# Patient Record
Sex: Male | Born: 1940 | Race: White | Hispanic: No
Health system: Midwestern US, Community
[De-identification: ages and names within clinical notes are randomized; demographics above are authoritative.]

## PROBLEM LIST (undated history)

## (undated) DIAGNOSIS — Z9889 Other specified postprocedural states: Secondary | ICD-10-CM

## (undated) DIAGNOSIS — I499 Cardiac arrhythmia, unspecified: Secondary | ICD-10-CM

## (undated) DIAGNOSIS — R112 Nausea with vomiting, unspecified: Secondary | ICD-10-CM

## (undated) DIAGNOSIS — E785 Hyperlipidemia, unspecified: Secondary | ICD-10-CM

## (undated) DIAGNOSIS — M7500 Adhesive capsulitis of unspecified shoulder: Secondary | ICD-10-CM

## (undated) DIAGNOSIS — N400 Enlarged prostate without lower urinary tract symptoms: Secondary | ICD-10-CM

## (undated) DIAGNOSIS — U071 COVID-19: Principal | ICD-10-CM

## (undated) DIAGNOSIS — E7800 Pure hypercholesterolemia, unspecified: Principal | ICD-10-CM

## (undated) DIAGNOSIS — K219 Gastro-esophageal reflux disease without esophagitis: Secondary | ICD-10-CM

## (undated) DIAGNOSIS — J301 Allergic rhinitis due to pollen: Secondary | ICD-10-CM

## (undated) DIAGNOSIS — N5201 Erectile dysfunction due to arterial insufficiency: Secondary | ICD-10-CM

## (undated) DIAGNOSIS — R051 Acute cough: Secondary | ICD-10-CM

## (undated) HISTORY — DX: Benign prostatic hyperplasia without lower urinary tract symptoms: N40.0

## (undated) HISTORY — PX: HERNIA REPAIR: SHX51

## (undated) HISTORY — PX: TRIGGER FINGER RELEASE: SHX641

---

## 2003-03-05 ENCOUNTER — Encounter: Admission: RE | Admit: 2003-03-05 | Discharge: 2003-03-05 | Payer: Self-pay | Admitting: Surgery

## 2003-03-05 ENCOUNTER — Encounter: Payer: Self-pay | Admitting: Surgery

## 2003-03-09 ENCOUNTER — Encounter (INDEPENDENT_AMBULATORY_CARE_PROVIDER_SITE_OTHER): Payer: Self-pay | Admitting: Specialist

## 2003-03-09 ENCOUNTER — Ambulatory Visit (HOSPITAL_COMMUNITY): Admission: RE | Admit: 2003-03-09 | Discharge: 2003-03-09 | Payer: Self-pay | Admitting: Surgery

## 2003-03-09 ENCOUNTER — Ambulatory Visit (HOSPITAL_BASED_OUTPATIENT_CLINIC_OR_DEPARTMENT_OTHER): Admission: RE | Admit: 2003-03-09 | Discharge: 2003-03-09 | Payer: Self-pay | Admitting: Surgery

## 2004-12-17 ENCOUNTER — Emergency Department (HOSPITAL_COMMUNITY): Admission: EM | Admit: 2004-12-17 | Discharge: 2004-12-18 | Payer: Self-pay | Admitting: Emergency Medicine

## 2006-02-20 ENCOUNTER — Ambulatory Visit: Payer: Self-pay | Admitting: Family Medicine

## 2006-03-27 ENCOUNTER — Ambulatory Visit: Payer: Self-pay | Admitting: Family Medicine

## 2006-04-08 ENCOUNTER — Ambulatory Visit: Payer: Self-pay | Admitting: Family Medicine

## 2006-11-05 ENCOUNTER — Ambulatory Visit: Payer: Self-pay | Admitting: Family Medicine

## 2007-05-22 HISTORY — PX: COLONOSCOPY: SHX174

## 2007-05-22 LAB — HM COLONOSCOPY

## 2007-05-28 ENCOUNTER — Ambulatory Visit: Payer: Self-pay | Admitting: Family Medicine

## 2007-07-10 ENCOUNTER — Ambulatory Visit: Payer: Self-pay | Admitting: Family Medicine

## 2007-08-22 ENCOUNTER — Ambulatory Visit: Payer: Self-pay | Admitting: Family Medicine

## 2007-09-12 ENCOUNTER — Ambulatory Visit: Payer: Self-pay | Admitting: Family Medicine

## 2007-10-20 ENCOUNTER — Ambulatory Visit: Payer: Self-pay | Admitting: Family Medicine

## 2008-02-03 ENCOUNTER — Ambulatory Visit: Payer: Self-pay | Admitting: Family Medicine

## 2008-12-24 ENCOUNTER — Ambulatory Visit: Payer: Self-pay | Admitting: Family Medicine

## 2009-02-21 ENCOUNTER — Ambulatory Visit: Payer: Self-pay | Admitting: Family Medicine

## 2009-04-22 ENCOUNTER — Ambulatory Visit: Payer: Self-pay | Admitting: Family Medicine

## 2009-06-08 ENCOUNTER — Ambulatory Visit: Payer: Self-pay | Admitting: Family Medicine

## 2009-07-20 ENCOUNTER — Ambulatory Visit: Payer: Self-pay | Admitting: Family Medicine

## 2009-07-29 ENCOUNTER — Encounter: Admission: RE | Admit: 2009-07-29 | Discharge: 2009-07-29 | Payer: Self-pay | Admitting: Family Medicine

## 2009-11-02 ENCOUNTER — Ambulatory Visit: Payer: Self-pay | Admitting: Family Medicine

## 2010-06-05 ENCOUNTER — Ambulatory Visit
Admission: RE | Admit: 2010-06-05 | Discharge: 2010-06-05 | Payer: Self-pay | Source: Home / Self Care | Attending: Family Medicine | Admitting: Family Medicine

## 2010-08-30 ENCOUNTER — Ambulatory Visit (INDEPENDENT_AMBULATORY_CARE_PROVIDER_SITE_OTHER): Payer: 59 | Admitting: Family Medicine

## 2010-08-30 DIAGNOSIS — S61409A Unspecified open wound of unspecified hand, initial encounter: Secondary | ICD-10-CM

## 2010-08-30 DIAGNOSIS — M79609 Pain in unspecified limb: Secondary | ICD-10-CM

## 2010-08-31 ENCOUNTER — Ambulatory Visit: Payer: Self-pay | Admitting: Family Medicine

## 2010-10-06 NOTE — Op Note (Signed)
Logan Banks, Logan Banks                       ACCOUNT NO.:  [id number]   MEDICAL RECORD NO.:  [id number]                   PATIENT TYPE:  AMB   LOCATION:  DSC                                  FACILITY:  [facility]   PHYSICIAN:  Thornton Park. Daphine Deutscher, M.D.             DATE OF BIRTH:  [birth year]   DATE OF PROCEDURE:  03/09/2003  DATE OF DISCHARGE:                                 OPERATIVE REPORT   CCS:  #[id number]   PREOPERATIVE DIAGNOSIS:  Right inguinal hernia.   POSTOPERATIVE DIAGNOSIS:  Large right indirect inguinal hernia.   PROCEDURE:  Right inguinal herniorrhaphy with mesh.   SURGEON:  Thornton Park. Daphine Deutscher, M.D.   ANESTHESIA:  General by LMA.   DESCRIPTION OF PROCEDURE:  The patient had the correct side marked, and was  taken back to room #3 at [facility]. [facility].  This  was done on March 09, 2003.  The lower abdomen was prepped with Betadine  and draped sterilely.  The skin was marked and a small oblique incision was  made, and the skin edges were marked to facilitate subsequent approximation.  We went down to external oblique which was identified and then there was a  prominent hernia noted coming out through the external ring.  I then incised  the fibers of the external ring and the external oblique, and then preserved  the ilioinguinal nerve branch with the superior flap.  I mobilized the cord  structures and then found a very large indirect inguinal hernia.  This was  dissected from the cord and about two inches worth of the sac was carried up  to the neck and the internal ring.  With my finger inside, I felt the floor,  and it felt reasonably strong, and no other masses were noted.  I then  twisted off the sac and did a high ligation with a #2-0 silk.  I removed the  excess sac.  The patient also had a very large lipoma of the cord which I removed, and  did a high ligation of this fatty tumor as well.  I then reconstructed the  internal ring using a  piece of mesh.  This was sutured along the inguinal  ligament and into the internal obliques and carried around the cord  structures and sutured to itself with a simple #2-0 Prolene.  This  reconstructed the floor.  The ilioinguinal nerve branch was allowed to  return back into anatomic position.  The area was irrigated and injected  with Marcaine.  The external oblique was closed with a running #2-0 Vicryl,  and #4-  0 Vicryl was used in the subcutaneous tissue, and to align the skin edges,  which then were closed with interrupted #5-0 Vicryl with Benzoin and Steri-  Strips.  The patient tolerated the procedure well, and was taken to the recovery room  in satisfactory condition.  Thornton Park Daphine Deutscher, M.D.    MBM/MEDQ  D:  03/09/2003  T:  03/09/2003  Job:  [id number]   cc:   Sharlot Gowda, M.D.  [address]  [place], [place] [address]  Fax: [phone]   Jethro Bolus, M.D.

## 2010-10-10 ENCOUNTER — Telehealth: Payer: Self-pay | Admitting: Family Medicine

## 2010-10-10 MED ORDER — ATORVASTATIN CALCIUM 10 MG PO TABS: 10.0000 mg | ORAL_TABLET | Freq: Every day | ORAL | Status: DC

## 2010-10-10 NOTE — Telephone Encounter (Signed)
Lipitor generic prescription was called in

## 2010-10-21 ENCOUNTER — Encounter: Payer: Self-pay | Admitting: Family Medicine

## 2010-10-21 DIAGNOSIS — N400 Enlarged prostate without lower urinary tract symptoms: Secondary | ICD-10-CM | POA: Insufficient documentation

## 2010-10-23 ENCOUNTER — Ambulatory Visit (INDEPENDENT_AMBULATORY_CARE_PROVIDER_SITE_OTHER): Payer: 59 | Admitting: Family Medicine

## 2010-10-23 ENCOUNTER — Encounter: Payer: Self-pay | Admitting: Family Medicine

## 2010-10-23 VITALS — BP 130/92 | HR 72 | Ht 68.0 in | Wt 169.0 lb

## 2010-10-23 DIAGNOSIS — Z8249 Family history of ischemic heart disease and other diseases of the circulatory system: Secondary | ICD-10-CM | POA: Insufficient documentation

## 2010-10-23 DIAGNOSIS — E785 Hyperlipidemia, unspecified: Secondary | ICD-10-CM

## 2010-10-23 DIAGNOSIS — J301 Allergic rhinitis due to pollen: Secondary | ICD-10-CM

## 2010-10-23 DIAGNOSIS — Z0000 Encounter for general adult medical examination without abnormal findings: Secondary | ICD-10-CM

## 2010-10-23 LAB — POCT URINALYSIS DIPSTICK
Glucose, UA: NEGATIVE
Leukocytes, UA: NEGATIVE
Nitrite, UA: NEGATIVE
Spec Grav, UA: 1.015
Urobilinogen, UA: NEGATIVE

## 2010-10-23 NOTE — Patient Instructions (Addendum)
Continue on your present medications and return here in the fall for a flu shot. If you start having any chest or heart related symptoms, don't hesitate to call me.

## 2010-10-23 NOTE — Progress Notes (Signed)
  Subjective:    Patient ID: Logan Banks, male    DOB: [birth year], 70 y.o.   MRN: [id number]  HPI He is here for a complete examination. Overall he seems to be doing well. He has had some urinary flow issues but states that this is doing much better. His allergies are under good control. He continues on Lipitor, aspirin, fish oil and multivitamins. He does exercise regularly. Does not smoke and rarely drinks. Presently he is not involved in any relationship. Work continues to go well.   Review of Systems  HENT: Negative.   Eyes: Negative.   Respiratory: Negative.   Cardiovascular: Negative.   Gastrointestinal: Negative.   Musculoskeletal: Negative.   Neurological: Negative.   Psychiatric/Behavioral: Negative.        Objective:   Physical Exam BP 130/92  Pulse 72  Ht 5\' 8"  (1.727 m)  Wt 169 lb (76.658 kg)  BMI 25.70 kg/m2  General Appearance:    Alert, cooperative, no distress, appears stated age  Head:    Normocephalic, without obvious abnormality, atraumatic  Eyes:    PERRL, conjunctiva/corneas clear, EOM's intact, fundi    benign  Ears:    Normal TM's and external ear canals  Nose:   Nares normal, mucosa normal, no drainage or sinus   tenderness  Throat:   Lips, mucosa, and tongue normal; teeth and gums normal  Neck:   Supple, no lymphadenopathy;  thyroid:  no   enlargement/tenderness/nodules; no carotid   bruit or JVD  Back:    Spine nontender, no curvature, ROM normal, no CVA     tenderness  Lungs:     Clear to auscultation bilaterally without wheezes, rales or     ronchi; respirations unlabored  Chest Wall:    No tenderness or deformity   Heart:    Regular rate and rhythm, S1 and S2 normal, no murmur, rub   or gallop  Breast Exam:    No chest wall tenderness, masses or gynecomastia  Abdomen:     Soft, non-tender, nondistended, normoactive bowel sounds,    no masses, no hepatosplenomegaly  Genitalia:    Normal male external genitalia without lesions.  Testicles  without masses.  No inguinal hernias.     Extremities:   No clubbing, cyanosis or edema  Pulses:   2+ and symmetric all extremities  Skin:   Skin color, texture, turgor normal, no rashes or lesions  Lymph nodes:   Cervical, supraclavicular, and axillary nodes normal  Neurologic:   CNII-XII intact, normal strength, sensation and gait; reflexes 2+ and symmetric throughout          Psych:   Normal mood, affect, hygiene and grooming.          Assessment & Plan:  Hyperlipidemia. Allergic rhinitis. Family history of heart disease. Continue to remain physically active. Routine blood screening will be ordered.

## 2011-01-02 ENCOUNTER — Telehealth: Payer: Self-pay | Admitting: Family Medicine

## 2011-01-02 NOTE — Telephone Encounter (Signed)
Needs refill generic xanax .25   [facility]

## 2011-01-02 NOTE — Telephone Encounter (Signed)
Renew his Xanax 

## 2011-01-02 NOTE — Telephone Encounter (Signed)
Med [facility] in [facility] .25 # 30 with 0 refill

## 2011-05-28 LAB — HM COLONOSCOPY

## 2011-06-23 ENCOUNTER — Other Ambulatory Visit: Payer: Self-pay

## 2011-06-23 ENCOUNTER — Observation Stay (HOSPITAL_COMMUNITY)
Admission: EM | Admit: 2011-06-23 | Discharge: 2011-06-24 | Disposition: A | Discharge status: Home / Self Care | Payer: Medicare Other | Attending: Internal Medicine | Admitting: Internal Medicine

## 2011-06-23 ENCOUNTER — Encounter (HOSPITAL_COMMUNITY): Payer: Self-pay | Admitting: *Deleted

## 2011-06-23 ENCOUNTER — Emergency Department (HOSPITAL_COMMUNITY): Payer: Medicare Other

## 2011-06-23 DIAGNOSIS — I2510 Atherosclerotic heart disease of native coronary artery without angina pectoris: Secondary | ICD-10-CM | POA: Insufficient documentation

## 2011-06-23 DIAGNOSIS — R0789 Other chest pain: Principal | ICD-10-CM | POA: Insufficient documentation

## 2011-06-23 DIAGNOSIS — E785 Hyperlipidemia, unspecified: Secondary | ICD-10-CM | POA: Diagnosis present

## 2011-06-23 DIAGNOSIS — Z79899 Other long term (current) drug therapy: Secondary | ICD-10-CM | POA: Insufficient documentation

## 2011-06-23 DIAGNOSIS — N400 Enlarged prostate without lower urinary tract symptoms: Secondary | ICD-10-CM | POA: Insufficient documentation

## 2011-06-23 DIAGNOSIS — Z7982 Long term (current) use of aspirin: Secondary | ICD-10-CM | POA: Insufficient documentation

## 2011-06-23 DIAGNOSIS — R079 Chest pain, unspecified: Secondary | ICD-10-CM | POA: Diagnosis present

## 2011-06-23 HISTORY — DX: Hyperlipidemia, unspecified: E78.5

## 2011-06-23 LAB — CBC
HCT: 45.3 % (ref 39.0–52.0)
Hemoglobin: 16 g/dL (ref 13.0–17.0)
MCH: 31.9 pg (ref 26.0–34.0)
MCHC: 35.3 g/dL (ref 30.0–36.0)
RBC: 5.02 MIL/uL (ref 4.22–5.81)

## 2011-06-23 LAB — DIFFERENTIAL
Basophils Relative: 0 % (ref 0–1)
Eosinophils Absolute: 0.1 10*3/uL (ref 0.0–0.7)
Lymphs Abs: 1 10*3/uL (ref 0.7–4.0)
Monocytes Absolute: 1.1 10*3/uL — ABNORMAL HIGH (ref 0.1–1.0)
Monocytes Relative: 11 % (ref 3–12)
Neutro Abs: 8.2 10*3/uL — ABNORMAL HIGH (ref 1.7–7.7)
Neutrophils Relative %: 79 % — ABNORMAL HIGH (ref 43–77)

## 2011-06-23 MED ORDER — ASPIRIN 81 MG PO CHEW: 324.0000 mg | CHEWABLE_TABLET | Freq: Once | ORAL | Status: DC

## 2011-06-23 MED ORDER — SODIUM CHLORIDE 0.9 % IV SOLN
999.0000 mL | INTRAVENOUS | Status: DC
  Administered 2011-06-23: 1000 mL via INTRAVENOUS
  Administered 2011-06-24 (×2): 999 mL via INTRAVENOUS

## 2011-06-23 MED ORDER — NITROGLYCERIN 0.4 MG SL SUBL
0.4000 mg | SUBLINGUAL_TABLET | SUBLINGUAL | Status: DC | PRN
  Administered 2011-06-24: 0.4 mg via SUBLINGUAL
  Filled 2011-06-23 (×2): qty 25

## 2011-06-23 MED ORDER — ASPIRIN 81 MG PO CHEW
324.0000 mg | CHEWABLE_TABLET | Freq: Once | ORAL | Status: AC
  Administered 2011-06-23: 324 mg via ORAL
  Filled 2011-06-23: qty 4

## 2011-06-23 NOTE — ED Notes (Signed)
Formatting of this note might be different from the original.  Pt states that he began to have indigestion x 1 day ago with concurrent right chest pain.  Pt went to [facility] on [address] this AM for evaluation at 0915.  Pt states that there was an EKG performed at that point with no abnormalities noted and the pt was sent home. Pt presents this evening due to the pain becoming "slightly stronger".  Electronically signed by Minna Merritts, RN at 06/23/2011 10:38 PM EST

## 2011-06-23 NOTE — ED Notes (Signed)
Pt states that he began to have indigestion x 1 day ago with concurrent right chest pain.  Pt went to [facility] on [address] this AM for evaluation at 0915.  Pt states that there was an EKG performed at that point with no abnormalities noted and the pt was sent home. Pt presents this evening due to the pain becoming "slightly stronger".

## 2011-06-24 ENCOUNTER — Encounter (HOSPITAL_COMMUNITY): Payer: Self-pay | Admitting: Family Medicine

## 2011-06-24 DIAGNOSIS — R079 Chest pain, unspecified: Secondary | ICD-10-CM | POA: Diagnosis present

## 2011-06-24 LAB — BASIC METABOLIC PANEL
Calcium: 8.7 mg/dL (ref 8.4–10.5)
GFR calc Af Amer: >90 mL/min (ref >90)
GFR calc non Af Amer: 84 mL/min — ABNORMAL LOW (ref >90)
Potassium: 3.7 mEq/L (ref 3.5–5.1)
Sodium: 140 mEq/L (ref 135–145)

## 2011-06-24 LAB — LIPID PANEL
HDL: 34 mg/dL — ABNORMAL LOW (ref >39)
LDL Cholesterol: 56 mg/dL (ref 0–99)
Total CHOL/HDL Ratio: 2.9 RATIO
VLDL: 9 mg/dL (ref 0–40)

## 2011-06-24 LAB — DIFFERENTIAL
Basophils Absolute: 0 10*3/uL (ref 0.0–0.1)
Eosinophils Relative: 0 % (ref 0–5)
Lymphocytes Relative: 6 % — ABNORMAL LOW (ref 12–46)
Lymphs Abs: 0.4 10*3/uL — ABNORMAL LOW (ref 0.7–4.0)
Neutro Abs: 6.3 10*3/uL (ref 1.7–7.7)
Neutrophils Relative %: 79 % — ABNORMAL HIGH (ref 43–77)

## 2011-06-24 LAB — COMPREHENSIVE METABOLIC PANEL
Albumin: 3.8 g/dL (ref 3.5–5.2)
Alkaline Phosphatase: 76 U/L (ref 39–117)
BUN: 17 mg/dL (ref 6–23)
Chloride: 102 mEq/L (ref 96–112)
Creatinine, Ser: 1.04 mg/dL (ref 0.50–1.35)
GFR calc Af Amer: 83 mL/min — ABNORMAL LOW (ref >90)
Glucose, Bld: 106 mg/dL — ABNORMAL HIGH (ref 70–99)
Potassium: 3.7 mEq/L (ref 3.5–5.1)
Total Bilirubin: 0.5 mg/dL (ref 0.3–1.2)

## 2011-06-24 LAB — CARDIAC PANEL(CRET KIN+CKTOT+MB+TROPI)
CK, MB: 3.6 ng/mL (ref 0.3–4.0)
CK, MB: 2.1 ng/mL (ref 0.3–4.0)
Relative Index: INVALID (ref 0.0–2.5)
Total CK: 122 U/L (ref 7–232)
Total CK: 78 U/L (ref 7–232)

## 2011-06-24 LAB — CBC
Hemoglobin: 14.8 g/dL (ref 13.0–17.0)
MCH: 31.8 pg (ref 26.0–34.0)
Platelets: 171 10*3/uL (ref 150–400)
RBC: 4.65 MIL/uL (ref 4.22–5.81)
WBC: 8.1 10*3/uL (ref 4.0–10.5)

## 2011-06-24 LAB — D-DIMER, QUANTITATIVE: D-Dimer, Quant: 0.28 ug/mL-FEU (ref 0.00–0.48)

## 2011-06-24 MED ORDER — ONDANSETRON HCL 4 MG PO TABS: 4.0000 mg | ORAL_TABLET | Freq: Four times a day (QID) | ORAL | Status: DC | PRN

## 2011-06-24 MED ORDER — ASPIRIN 325 MG PO TABS
325.0000 mg | ORAL_TABLET | Freq: Every day | ORAL | Status: DC
  Administered 2011-06-24: 325 mg via ORAL
  Filled 2011-06-24 (×2): qty 1

## 2011-06-24 MED ORDER — SODIUM CHLORIDE 0.9 % IJ SOLN: 3.0000 mL | INTRAMUSCULAR | Status: DC | PRN

## 2011-06-24 MED ORDER — MORPHINE SULFATE 4 MG/ML IJ SOLN
4.0000 mg | Freq: Once | INTRAMUSCULAR | Status: DC
  Filled 2011-06-24: qty 1

## 2011-06-24 MED ORDER — METOPROLOL SUCCINATE ER 25 MG PO TB24: 12.5000 mg | ORAL_TABLET | Freq: Every day | ORAL | Status: DC

## 2011-06-24 MED ORDER — WHITE PETROLATUM GEL
Status: AC
  Administered 2011-06-24: 04:00:00
  Filled 2011-06-24: qty 5

## 2011-06-24 MED ORDER — ONDANSETRON HCL 4 MG/2ML IJ SOLN: 4.0000 mg | Freq: Four times a day (QID) | INTRAMUSCULAR | Status: DC | PRN

## 2011-06-24 MED ORDER — ONDANSETRON HCL 4 MG/2ML IJ SOLN
4.0000 mg | Freq: Once | INTRAMUSCULAR | Status: DC
  Filled 2011-06-24: qty 2

## 2011-06-24 MED ORDER — BIOTENE DRY MOUTH MT LIQD
15.0000 mL | Freq: Two times a day (BID) | OROMUCOSAL | Status: DC
  Administered 2011-06-24: 15 mL via OROMUCOSAL

## 2011-06-24 MED ORDER — ALUM & MAG HYDROXIDE-SIMETH 200-200-20 MG/5ML PO SUSP: 30.0000 mL | Freq: Four times a day (QID) | ORAL | Status: DC | PRN

## 2011-06-24 MED ORDER — ENOXAPARIN SODIUM 40 MG/0.4ML ~~LOC~~ SOLN
40.0000 mg | SUBCUTANEOUS | Status: DC
  Administered 2011-06-24: 40 mg via SUBCUTANEOUS
  Filled 2011-06-24 (×2): qty 0.4

## 2011-06-24 MED ORDER — SIMVASTATIN 20 MG PO TABS
20.0000 mg | ORAL_TABLET | Freq: Every day | ORAL | Status: DC
  Administered 2011-06-24: 20 mg via ORAL
  Filled 2011-06-24 (×2): qty 1

## 2011-06-24 MED ORDER — MORPHINE SULFATE 2 MG/ML IJ SOLN: 2.0000 mg | INTRAMUSCULAR | Status: DC | PRN

## 2011-06-24 MED ORDER — HYDROCODONE-ACETAMINOPHEN 5-325 MG PO TABS: 1.0000 | ORAL_TABLET | ORAL | Status: DC | PRN

## 2011-06-24 NOTE — Progress Notes (Signed)
Formatting of this note might be different from the original.  Patient  Had some trigeminy and quad PVCs, denies any pain or distress, in bed resting; Dr David Stall notified, at the bedside to assess patient, no new order given. Will continue to assess patient.   Electronically signed by Orlando Penner, RN at 06/24/2011  2:00 PM EST

## 2011-06-24 NOTE — Discharge Summary (Signed)
Formatting of this note is different from the original.  Admit date: 06/23/2011  Discharge date: 06/24/2011    Primary Care Physician:  Carollee Herter, MD, MD    Discharge Diagnoses:    Active Hospital Problems   Diagnoses Date Noted    ? Hyperlipidemia 10/23/2010    ? BPH (benign prostatic hyperplasia)       Resolved Hospital Problems   Diagnoses Date Noted Date Resolved   ? Chest pain 06/24/2011 06/24/2011       DISCHARGE MEDICATION:  Medication List  As of 06/24/2011 12:46 PM    TAKE these medications        aspirin 81 MG tablet    Take 81 mg by mouth daily.       atorvastatin 10 MG tablet    Commonly known as: LIPITOR    Take 1 tablet (10 mg total) by mouth daily.       fish oil-omega-3 fatty acids 1000 MG capsule    Take 2 g by mouth daily.       glucosamine-chondroitin 500-400 MG tablet    Take 1 tablet by mouth 3 (three) times daily.       multivitamin with minerals tablet    Take 1 tablet by mouth daily.             SIGNIFICANT DIAGNOSTIC STUDIES:   Dg Chest 2 View    06/24/2011  *RADIOLOGY REPORT*  Clinical Data: Right-sided chest pain  CHEST - 2 VIEW  Comparison: None.  Findings: Heart size upper normal limits to mildly enlarged. Mildly prominent pulmonary arterial branches centrally.  1 cm nodule periphery left lower lobe is favored to be calcified/sequelae of prior granulomatous infection. Mild rightward curvature of the lower thoracic spine.  No acute osseous abnormality.  IMPRESSION: Heart size upper normal limits, with mild pulmonary arterial prominence.  No acute process identified.  Original Report Authenticated By: Waneta Martins, M.D.     No results found for this or any previous visit (from the past 240 hour(s)).    BRIEF ADMITTING H & P:  71 year old gentleman with no known history of coronary artery disease. He states that yesterday he developed right-sided chest pain, no radiation. It woke him from his sleep. He thought it was indigestion and went back to sleep. He woke up this a.m. with the  same chest pain right-sided. It persisted all day. He did stop at an urgent care during the day were EKG was done and normal. However in returning home his significant other he insisted he come to the ER. Pain is described as dull, 7/10 at its worse. He reports no cough, no nausea, no vomiting, no trauma. He reports no radiation. He reports no GERD. He reports possible mild pain with very deep inspiration. He reports no wheeze, no shortness of breath. He did have a stress test approximately 5 years ago, it was negative. History obtained from the patient. His cardiologist is Dr. Katrinka Blazing, he was referred to Dr. Katrinka Blazing 5 years ago by his PCP since because of his family history of coronary artery disease.    Active Hospital Problems   Diagnoses Date Noted    ? Hyperlipidemia:  Continue lipitor. 10/23/2010    ? BPH (benign prostatic hyperplasia):  No changes       Resolved Hospital Problems   Diagnoses Date Noted Date Resolved   ? Chest pain:  He was admitted to the telemetry unit. Had an episode of trigeminy. Which resolved. He was started on  metoprolol. Cardiac enzymes negative times 2, he really wanted to leave on the day of discharge.  He related that he will follow up with Dr. Katrinka Blazing but he really needed to go today. Has remained asymptomatic. 06/24/2011 06/24/2011       Disposition and Follow-up:   Discharge Orders     Future Orders Please Complete By Expires    Diet - low sodium heart healthy       Increase activity slowly          Follow-up Information     Follow up with Carollee Herter, MD in 1 week. (follow up )       Follow up with Lesleigh Noe, MD in 1 week. (call for appointment)     Contact information:    [address]  [address]  [place] [address]  [phone]          DISCHARGE EXAM:   See progress note.    Blood pressure 110/68, pulse 98, temperature 98.2 F (36.8 C), temperature source Oral, resp. rate 16, height 5\' 8"  (1.727 m), weight 76.4 kg (168 lb 6.9 oz), SpO2  98.00%.    Basename 06/24/11 0520 06/23/11 2320   NA 140 138   K 3.7 3.7   CL 106 102   CO2 26 26   GLUCOSE 107* 106*   BUN 14 17   CREATININE 0.93 1.04   CALCIUM 8.7 9.5   MG -- --   PHOS -- --     Basename 06/23/11 2320   AST 25   ALT 27   ALKPHOS 76   BILITOT 0.5   PROT 7.6   ALBUMIN 3.8     No results found for this basename: LIPASE:2,AMYLASE:2 in the last 72 hours    Basename 06/24/11 0520 06/23/11 2320   WBC 8.1 10.4   NEUTROABS 6.3 8.2*   HGB 14.8 16.0   HCT 41.5 45.3   MCV 89.2 90.2   PLT 171 197     Signed:  Marinda Elk M.D.  06/24/2011, 12:46 PM    Electronically signed by Marinda Elk, MD at 06/24/2011  4:47 PM EST

## 2011-06-24 NOTE — Progress Notes (Signed)
Formatting of this note might be different from the original.  Patient D/C'd home  With girlfriend, discharge instruction given and patient. Patient verbalized understand, denies any distress or pain, rated his pain 0 out 10 pain scale. Skin intact, no wound.  Accompanied home by girlfriend.      Electronically signed by Orlando Penner, RN at 06/24/2011  6:21 PM EST

## 2011-06-24 NOTE — ED Notes (Signed)
Formatting of this note might be different from the original.  MD at bedside. Admitting MD  Electronically signed by Leonides Schanz, RN at 06/24/2011  2:18 AM EST

## 2011-06-24 NOTE — ED Provider Notes (Signed)
Formatting of this note is different from the original.  History       CSN: [id number]    Arrival date & time 06/23/11  2232     First MD Initiated Contact with Patient 06/23/11 2316        Chief Complaint   Patient presents with   ? Chest Pain     sore, dull, worse w/ inspiration and when lying flat.     (Consider location/radiation/quality/duration/timing/severity/associated sxs/prior  treatment)  HPI  Patient presents with complaint of chest pain. He states the pain began approximately 2 AM and almost 24 hours prior to arrival. Pain is located in his right chest and is worse with lying flat. It is described as a dull aching pain. He states that prior to ED evaluation he went to an urgent care and had a normal EKG. He states he came to the emergency department tonight because pain became somewhat worse. He rates pain as 7/10. He denies any shortness of breath or nausea or radiation of the pain. He did not have any diaphoresis until arrival in the ED when he developed diaphoresis while being triaged. He has had no syncope or palpitations. He has no history of heart disease of which he is aware. He states he did have a stress test several years ago which he believes was normal. Chest pain is not worsened with exertion or movement. Seems to be improved with lying flat. He has not taken any medications for the pain except and aspirin earlier this morning. There no other alleviating or modifying factors. There no associated systemic symptoms.    Past Medical History   Diagnosis Date   ? BPH (benign prostatic hyperplasia)    ? Hyperlipidemia    ? Coronary artery disease      Past Surgical History   Procedure Date   ? Colonoscopy 2009     [facility]   ? Hernia repair      Family History   Problem Relation Age of Onset   ? Coronary artery disease       History   Substance Use Topics   ? Smoking status: Never Smoker    ? Smokeless tobacco: Never Used   ? Alcohol Use: 0.6 oz/week     1 Glasses of wine per week     Review of  Systems  ROS reviewed and otherwise negative except for mentioned in HPI    Allergies   Review of patient's allergies indicates no known allergies.    Home Medications     Current Outpatient Rx   Name Route Sig Dispense Refill   ? ASPIRIN 81 MG PO TABS Oral Take 81 mg by mouth daily.       ? ATORVASTATIN CALCIUM 10 MG PO TABS Oral Take 1 tablet (10 mg total) by mouth daily. 30 tablet 11   ? OMEGA-3 FATTY ACIDS 1000 MG PO CAPS Oral Take 2 g by mouth daily.       ? GLUCOSAMINE-CHONDROITIN 500-400 MG PO TABS Oral Take 1 tablet by mouth 3 (three) times daily.     ? MULTI-VITAMIN/MINERALS PO TABS Oral Take 1 tablet by mouth daily.      ? METOPROLOL SUCCINATE ER 25 MG PO TB24 Oral Take 0.5 tablets (12.5 mg total) by mouth daily. 60 tablet 0     BP 109/69  Pulse 92  Temp(Src) 99.3 F (37.4 C) (Oral)  Resp 18  Ht 5\' 8"  (1.727 m)  Wt 168 lb 6.9 oz (76.4 kg)  BMI 25.61 kg/m2  SpO2 96%  Vitals reviewed  Physical Exam  Physical Examination: General appearance - alert, well appearing, and in no distress, concerned appearing  Mental status - alert, oriented to person, place, and time  Eyes - pupils equal and reactive, no scleral icterus  Mouth - mucous membranes moist, pharynx normal without lesions  Chest - clear to auscultation, no wheezes, rales or rhonchi, symmetric air entry, nontender  Heart - normal rate, regular rhythm, normal S1, S2, no murmurs, rubs, clicks or gallops  Abdomen - soft, nontender, nondistended, no masses or organomegaly  Musculoskeletal - no joint tenderness, deformity or swelling  Extremities - peripheral pulses normal, no pedal edema, no clubbing or cyanosis  Skin - normal coloration and turgor, no rashes, mild diaphoresis of forehead    ED Course   Procedures (including critical care time)     Date: 06/24/2011   Rate: 82   Rhythm: normal sinus rhythm   QRS Axis: normal   Intervals: normal   ST/T Wave abnormalities: normal   Conduction Disutrbances:none   Narrative Interpretation:    Old EKG  Reviewed: unchanged compared to 03/05/03    1:25 AM d/w Dr. Joneen Roach, [facility] hospitalist- she states she will see patient in the ED    Labs Reviewed   DIFFERENTIAL - Abnormal; Notable for the following:     Neutrophils Relative 79 (*)     Neutro Abs 8.2 (*)     Lymphocytes Relative 10 (*)     Monocytes Absolute 1.1 (*)     All other components within normal limits   COMPREHENSIVE METABOLIC PANEL - Abnormal; Notable for the following:     Glucose, Bld 106 (*)     GFR calc non Af Amer 72 (*)     GFR calc Af Amer 83 (*)     All other components within normal limits   CARDIAC PANEL(CRET KIN+CKTOT+MB+TROPI) - Abnormal; Notable for the following:     Relative Index 3.0 (*)     All other components within normal limits   LIPID PANEL - Abnormal; Notable for the following:     HDL 34 (*)     All other components within normal limits   BASIC METABOLIC PANEL - Abnormal; Notable for the following:     Glucose, Bld 107 (*)     GFR calc non Af Amer 84 (*)     All other components within normal limits   DIFFERENTIAL - Abnormal; Notable for the following:     Neutrophils Relative 79 (*)     Lymphocytes Relative 6 (*)     Lymphs Abs 0.4 (*)     Monocytes Relative 15 (*)     Monocytes Absolute 1.2 (*)     All other components within normal limits   CBC   D-DIMER, QUANTITATIVE   CBC   D-DIMER, QUANTITATIVE   CARDIAC PANEL(CRET KIN+CKTOT+MB+TROPI)   TROPONIN I   DIFFERENTIAL     Dg Chest 2 View    06/24/2011  *RADIOLOGY REPORT*  Clinical Data: Right-sided chest pain  CHEST - 2 VIEW  Comparison: None.  Findings: Heart size upper normal limits to mildly enlarged. Mildly prominent pulmonary arterial branches centrally.  1 cm nodule periphery left lower lobe is favored to be calcified/sequelae of prior granulomatous infection. Mild rightward curvature of the lower thoracic spine.  No acute osseous abnormality.  IMPRESSION: Heart size upper normal limits, with mild pulmonary arterial prominence.  No acute process identified.  Original Report  Authenticated By:  Waneta Martins, M.D.     1. Chest pain      MDM   Patient seen and evaluated upon being brought back to the ED room. He had mild diaphoresis and pain 7/10 in his right chest. Otherwise exam was reassuring. His EKG was reassuring as well as normal troponin and chest x-ray. Pain was relieved down to 4/10 with nitroglycerin. I ordered morphine 2 continue to treat the chest pain however patient stated that he did not wish to take the morphine. I discussed the patient with try hospitalist to plan to see the patient in the ED for admission and further evaluation.    Ethelda Chick, MD  06/25/11 [phone]  Electronically signed by Ethelda Chick, MD at 06/25/2011  2:48 AM EST

## 2011-06-24 NOTE — ED Notes (Signed)
Formatting of this note might be different from the original.  Grafton Folk  [phone].  Electronically signed by Leonides Schanz, RN at 06/24/2011 12:50 AM EST

## 2011-06-24 NOTE — H&P (Signed)
Formatting of this note is different from the original.  PCP:    Carollee Herter, MD, MD   Cardiologist in Dr. Katrinka Blazing    Chief Complaint:   Chest pains    HPI:  This is a 71 year old gentleman with no known history of coronary artery disease. He states that yesterday he developed right-sided chest pain, no radiation. It woke him from his sleep. He thought it was indigestion and went back to sleep. He woke up this a.m. with the same chest pain right-sided. It persisted all day. He did stop at an urgent care during the day were EKG was done and normal. However in returning home his significant other he insisted he come to the ER. Pain is described as dull, 7/10 at its worse. He reports no cough, no nausea, no vomiting, no trauma. He reports no radiation. He reports no GERD. He reports possible mild pain with very deep inspiration. He reports no wheeze, no shortness of breath. He did have a stress test approximately 5 years ago, it was negative. History obtained from the patient. His cardiologist is Dr. Katrinka Blazing, he was referred to Dr. Katrinka Blazing 5 years ago by his PCP since because of his family history of coronary artery disease.    Review of Systems: Positives bolded    anorexia, fever, weight loss,, vision loss, decreased hearing, hoarseness, chest pain, syncope, dyspnea on exertion, peripheral edema, balance deficits, hemoptysis, abdominal pain, melena, hematochezia, severe indigestion/heartburn, hematuria, incontinence, genital sores, muscle weakness, suspicious skin lesions, transient blindness, difficulty walking, depression, unusual weight change, abnormal bleeding, enlarged lymph nodes, angioedema, and breast masses.    Past Medical History:  Past Medical History   Diagnosis Date   ? BPH (benign prostatic hyperplasia)    ? Hyperlipidemia      Past Surgical History   Procedure Date   ? Colonoscopy 2009     [facility]   ? Hernia repair      Medications:  Prior to Admission medications    Medication Sig Start Date End  Date Taking? Authorizing Provider   aspirin 81 MG tablet Take 81 mg by mouth daily.     Yes Historical Provider, MD   atorvastatin (LIPITOR) 10 MG tablet Take 1 tablet (10 mg total) by mouth daily. 10/10/10 10/10/11 Yes Carollee Herter, MD   fish oil-omega-3 fatty acids 1000 MG capsule Take 2 g by mouth daily.     Yes Historical Provider, MD   glucosamine-chondroitin 500-400 MG tablet Take 1 tablet by mouth 3 (three) times daily.   Yes Historical Provider, MD   Multiple Vitamins-Minerals (MULTIVITAMIN WITH MINERALS) tablet Take 1 tablet by mouth daily.    Yes Historical Provider, MD     Allergies:   No Known Allergies    Social History:   reports that he has never smoked. He has never used smokeless tobacco. He reports that he drinks about .6 ounces of alcohol per week. He reports that he does not use illicit drugs.    Family History:  Family History   Problem Relation Age of Onset   ? Coronary artery disease       Physical Exam:  Filed Vitals:    06/23/11 2315 06/23/11 2323 06/23/11 2330 06/24/11 0129   BP: 141/76 134/73 126/75 115/55   Pulse: 83 83 86 101   Temp:       TempSrc:       Resp: 13 13 18     Weight:       SpO2: 100% 100%  100% 98%     General:  Alert and oriented times three, well developed and nourished, no acute distress  Eyes: PERRLA, pink conjunctiva, no scleral icterus  ENT: Moist oral mucosa, neck supple, no thyromegaly  Lungs: clear to ascultation, no wheeze, no crackles, no use of accessory muscles  Cardiovascular: regular rate and rhythm, no regurgitation, no gallops, no murmurs. No carotid bruits, no JVD  Abdomen: soft, positive BS, non-tender, non-distended, no organomegaly, not an acute abdomen  GU: not examined  Neuro: CN II - XII grossly intact, sensation intact  Musculoskeletal: strength 5/5 all extremities, no clubbing, cyanosis or edema, no reproducible chest wall pain  Skin: no rash, no subcutaneous crepitation, no decubitus  Psych: appropriate patient    Labs on Admission:      [facility] 06/23/11 2320   NA 138   K 3.7   CL 102   CO2 26   GLUCOSE 106*   BUN 17   CREATININE 1.04   CALCIUM 9.5   MG --   PHOS --     Basename 06/23/11 2320   AST 25   ALT 27   ALKPHOS 76   BILITOT 0.5   PROT 7.6   ALBUMIN 3.8     No results found for this basename: LIPASE:2,AMYLASE:2 in the last 72 hours    Basename 06/23/11 2320   WBC 10.4   NEUTROABS 8.2*   HGB 16.0   HCT 45.3   MCV 90.2   PLT 197     Basename 06/23/11 2320   CKTOTAL 122   CKMB 3.6   CKMBINDEX --   TROPONINI <0.30     No components found with this basename: POCBNP:3    Basename 06/23/11 2320   DDIMER 0.31     No results found for this basename: HGBA1C:2 in the last 72 hours  No results found for this basename: CHOL:2,HDL:2,LDLCALC:2,TRIG:2,CHOLHDL:2,LDLDIRECT:2 in the last 72 hours  No results found for this basename: TSH,T4TOTAL,FREET3,T3FREE,THYROIDAB in the last 72 hours  No results found for this basename: VITAMINB12:2,FOLATE:2,FERRITIN:2,TIBC:2,IRON:2,RETICCTPCT:2 in the last 72 hours    Micro Results:  No results found for this or any previous visit (from the past 240 hour(s)).    Radiological Exams on Admission:  Dg Chest 2 View    06/24/2011  *RADIOLOGY REPORT*  Clinical Data: Right-sided chest pain  CHEST - 2 VIEW  Comparison: None.  Findings: Heart size upper normal limits to mildly enlarged. Mildly prominent pulmonary arterial branches centrally.  1 cm nodule periphery left lower lobe is favored to be calcified/sequelae of prior granulomatous infection. Mild rightward curvature of the lower thoracic spine.  No acute osseous abnormality.  IMPRESSION: Heart size upper normal limits, with mild pulmonary arterial prominence.  No acute process identified.  Original Report Authenticated By: Waneta Martins, M.D.     EKG normal sinus    Assessment/Plan  Present on Admission:   ?right Chest pain  Admit to telemetry   Cycle cardiac enzymes  Aspirin, lipid panel in a.m.  Nitroglycerin when necessary, morphine when necessary pain   Doubt  cardiac etiology  ?BPH (benign prostatic hyperplasia)  ?Hyperlipidemia  Lipid panel in a.m.  Stable    Full code  DVT prophylaxis  Team 5/Dr. Andree Moro, DEBBY  06/24/2011, 2:55 AM   Electronically signed by Gery Pray, MD at 06/24/2011  3:06 AM EST

## 2011-06-24 NOTE — ED Notes (Signed)
Formatting of this note might be different from the original.  PATIENT HAS HAD SEVERAL EPISODES OF TRIGEMINY. RN NOTIFIED. MD notified  Electronically signed by Basilia Jumbo, RN at 06/24/2011  1:42 AM EST

## 2011-06-24 NOTE — Progress Notes (Signed)
Formatting of this note might be different from the original.  06/24/11-Received telephone report from Anna,RN in ER Dept. @ 03:50am. Awaiting pt's arrival to floor Room#[id number]  for admission process.  Electronically signed by Brynda Greathouse, RN at 06/24/2011  3:58 AM EST

## 2011-06-24 NOTE — ED Notes (Signed)
Formatting of this note might be different from the original.  Patient denies pain and is resting comfortably. States he is sore only and he doesn't want pain medication,  Pt is having runs of [place]ig[place],  Dr Karma Ganja aware and going to see pt for admission process  Electronically signed by Leonides Schanz, RN at 06/24/2011  1:18 AM EST

## 2011-06-24 NOTE — ED Notes (Signed)
Formatting of this note might be different from the original.  Pt returned from xray  Electronically signed by Leonides Schanz, RN at 06/24/2011 12:09 AM EST

## 2011-06-24 NOTE — Progress Notes (Signed)
Formatting of this note is different from the original.  Subjective:    Pain improved today.  Worst with movement on the right.  Objective:  Filed Vitals:    06/24/11 0315 06/24/11 0330 06/24/11 0420 06/24/11 0702   BP:  102/76 111/68 110/68   Pulse: 86 86 95 98   Temp:   98.2 F (36.8 C) 98.2 F (36.8 C)   TempSrc:   Oral Oral   Resp: 13 15 16 16    Height:   5\' 8"  (1.727 m)    Weight:   76.4 kg (168 lb 6.9 oz)    SpO2: 100% 100% 98% 98%     Weight change:     Intake/Output Summary (Last 24 hours) at 06/24/11 1101  Last data filed at 06/24/11 0900   Gross per 24 hour   Intake    360 ml   Output    250 ml   Net    110 ml     General: Alert, awake, oriented x3, in no acute distress.   HEENT: No bruits, no goiter.   Heart: Regular rate and rhythm, without murmurs, rubs, gallops.   Lungs: good air movement cts B/L, mild tenderness on the right side to palpation.  Abdomen: Soft, nontender, nondistended, positive bowel sounds.   Neuro: Grossly intact, nonfocal.    Lab Results:    Basename 06/24/11 0520 06/23/11 2320   NA 140 138   K 3.7 3.7   CL 106 102   CO2 26 26   GLUCOSE 107* 106*   BUN 14 17   CREATININE 0.93 1.04   CALCIUM 8.7 9.5   MG -- --   PHOS -- --     Basename 06/23/11 2320   AST 25   ALT 27   ALKPHOS 76   BILITOT 0.5   PROT 7.6   ALBUMIN 3.8     No results found for this basename: LIPASE:2,AMYLASE:2 in the last 72 hours    Basename 06/24/11 0520 06/23/11 2320   WBC 8.1 10.4   NEUTROABS -- 8.2*   HGB 14.8 16.0   HCT 41.5 45.3   MCV 89.2 90.2   PLT 171 197     Basename 06/23/11 2320   CKTOTAL 122   CKMB 3.6   CKMBINDEX --   TROPONINI <0.30     No components found with this basename: POCBNP:3    Basename 06/23/11 2320   DDIMER 0.31     No results found for this basename: HGBA1C:2 in the last 72 hours  No results found for this basename: CHOL:2,HDL:2,LDLCALC:2,TRIG:2,CHOLHDL:2,LDLDIRECT:2 in the last 72 hours  No results found for this basename: TSH,T4TOTAL,FREET3,T3FREE,THYROIDAB in the last 72 hours  No  results found for this basename: VITAMINB12:2,FOLATE:2,FERRITIN:2,TIBC:2,IRON:2,RETICCTPCT:2 in the last 72 hours    Micro Results:  No results found for this or any previous visit (from the past 240 hour(s)).    Studies/Results:  Dg Chest 2 View    06/24/2011  *RADIOLOGY REPORT*  Clinical Data: Right-sided chest pain  CHEST - 2 VIEW  Comparison: None.  Findings: Heart size upper normal limits to mildly enlarged. Mildly prominent pulmonary arterial branches centrally.  1 cm nodule periphery left lower lobe is favored to be calcified/sequelae of prior granulomatous infection. Mild rightward curvature of the lower thoracic spine.  No acute osseous abnormality.  IMPRESSION: Heart size upper normal limits, with mild pulmonary arterial prominence.  No acute process identified.  Original Report Authenticated By: Waneta Martins, M.D.  Medications: I have reviewed the patient's current medications.    Assessment and plan:  Principal Problem:  1.Chest pain:  Very low risk for cardiac event. Check a  D-dimer if positive a CT angio.  -EKG no st segment changes. Mild tachy on telemetry. Trigeminy on Telemetry. No smoking history, not a diabetic, no family history of MI. If cardiac enzymes negative can follow up with cards as an outpatient.    2. Hyperlipidemia  Continue. Lipitor.     LOS: 1 day     Marinda Elk M.D.  Pager: [phone]  [facility]  06/24/2011, 11:01 AM      Electronically signed by Marinda Elk, MD at 06/24/2011 11:27 AM EST

## 2011-06-24 NOTE — ED Notes (Signed)
[facility]  [phone].

## 2011-06-24 NOTE — Progress Notes (Signed)
Subjective:  Pain improved today. Worst with movement on the right. Objective: Filed Vitals:   06/24/11 0315 06/24/11 0330 06/24/11 0420 06/24/11 0702  BP:  102/76 111/68 110/68  Pulse: 86 86 95 98  Temp:   98.2 F (36.8 C) 98.2 F (36.8 C)  TempSrc:   Oral Oral  Resp: 13 15 16 16   Height:   5\' 8"  (1.727 m)   Weight:   76.4 kg (168 lb 6.9 oz)   SpO2: 100% 100% 98% 98%   Weight change:   Intake/Output Summary (Last 24 hours) at 06/24/11 1101 Last data filed at 06/24/11 0900  Gross per 24 hour  Intake    360 ml  Output    250 ml  Net    110 ml    General: Alert, awake, oriented x3, in no acute distress.  HEENT: No bruits, no goiter.  Heart: Regular rate and rhythm, without murmurs, rubs, gallops.  Lungs: good air movement cts B/L, mild tenderness on the right side to palpation. Abdomen: Soft, nontender, nondistended, positive bowel sounds.  Neuro: Grossly intact, nonfocal.   Lab Results:  Basename 06/24/11 0520 06/23/11 2320  NA 140 138  K 3.7 3.7  CL 106 102  CO2 26 26  GLUCOSE 107* 106*  BUN 14 17  CREATININE 0.93 1.04  CALCIUM 8.7 9.5  MG -- --  PHOS -- --    Basename 06/23/11 2320  AST 25  ALT 27  ALKPHOS 76  BILITOT 0.5  PROT 7.6  ALBUMIN 3.8   No results found for this basename: LIPASE:2,AMYLASE:2 in the last 72 hours  Basename 06/24/11 0520 06/23/11 2320  WBC 8.1 10.4  NEUTROABS -- 8.2*  HGB 14.8 16.0  HCT 41.5 45.3  MCV 89.2 90.2  PLT 171 197    Basename 06/23/11 2320  CKTOTAL 122  CKMB 3.6  CKMBINDEX --  TROPONINI <0.30   No components found with this basename: POCBNP:3  Basename 06/23/11 2320  DDIMER 0.31   No results found for this basename: HGBA1C:2 in the last 72 hours No results found for this basename: CHOL:2,HDL:2,LDLCALC:2,TRIG:2,CHOLHDL:2,LDLDIRECT:2 in the last 72 hours No results found for this basename: TSH,T4TOTAL,FREET3,T3FREE,THYROIDAB in the last 72 hours No results found for this basename:  VITAMINB12:2,FOLATE:2,FERRITIN:2,TIBC:2,IRON:2,RETICCTPCT:2 in the last 72 hours  Micro Results: No results found for this or any previous visit (from the past 240 hour(s)).  Studies/Results: Dg Chest 2 View  06/24/2011  *RADIOLOGY REPORT*  Clinical Data: Right-sided chest pain  CHEST - 2 VIEW  Comparison: None.  Findings: Heart size upper normal limits to mildly enlarged. Mildly prominent pulmonary arterial branches centrally.  1 cm nodule periphery left lower lobe is favored to be calcified/sequelae of prior granulomatous infection. Mild rightward curvature of the lower thoracic spine.  No acute osseous abnormality.  IMPRESSION: Heart size upper normal limits, with mild pulmonary arterial prominence.  No acute process identified.  Original Report Authenticated By: Waneta Martins, M.D.    Medications: I have reviewed the patient's current medications.  Assessment and plan: Principal Problem: 1.Chest pain: Very low risk for cardiac event. Check a  D-dimer if positive a CT angio. -EKG no st segment changes. Mild tachy on telemetry. Trigeminy on Telemetry. No smoking history, not a diabetic, no family history of MI. If cardiac enzymes negative can follow up with cards as an outpatient.  2. Hyperlipidemia Continue. Lipitor.     LOS: 1 day   Marinda Elk M.D. Pager: [phone] [facility] 06/24/2011, 11:01 AM

## 2011-06-24 NOTE — ED Notes (Signed)
Patient denies pain and is resting comfortably. States he is sore only and he doesn't want pain medication,  Pt is having runs of [place]ig[place],  Dr Karma Ganja aware and going to see pt for admission process

## 2011-06-24 NOTE — Discharge Summary (Signed)
Admit date: 06/23/2011 Discharge date: 06/24/2011  Primary Care Physician:  Carollee Herter, MD, MD   Discharge Diagnoses:   Active Hospital Problems  Diagnoses Date Noted   . Hyperlipidemia 10/23/2010   . BPH (benign prostatic hyperplasia)      Resolved Hospital Problems  Diagnoses Date Noted Date Resolved  . Chest pain 06/24/2011 06/24/2011     DISCHARGE MEDICATION: Medication List  As of 06/24/2011 12:46 PM   TAKE these medications         aspirin 81 MG tablet   Take 81 mg by mouth daily.      atorvastatin 10 MG tablet   Commonly known as: LIPITOR   Take 1 tablet (10 mg total) by mouth daily.      fish oil-omega-3 fatty acids 1000 MG capsule   Take 2 g by mouth daily.      glucosamine-chondroitin 500-400 MG tablet   Take 1 tablet by mouth 3 (three) times daily.      multivitamin with minerals tablet   Take 1 tablet by mouth daily.             SIGNIFICANT DIAGNOSTIC STUDIES:  Dg Chest 2 View  06/24/2011  *RADIOLOGY REPORT*  Clinical Data: Right-sided chest pain  CHEST - 2 VIEW  Comparison: None.  Findings: Heart size upper normal limits to mildly enlarged. Mildly prominent pulmonary arterial branches centrally.  1 cm nodule periphery left lower lobe is favored to be calcified/sequelae of prior granulomatous infection. Mild rightward curvature of the lower thoracic spine.  No acute osseous abnormality.  IMPRESSION: Heart size upper normal limits, with mild pulmonary arterial prominence.  No acute process identified.  Original Report Authenticated By: Waneta Martins, M.D.    No results found for this or any previous visit (from the past 240 hour(s)).  BRIEF ADMITTING H & P: 71 year old gentleman with no known history of coronary artery disease. He states that yesterday he developed right-sided chest pain, no radiation. It woke him from his sleep. He thought it was indigestion and went back to sleep. He woke up this a.m. with the same chest pain right-sided. It  persisted all day. He did stop at an urgent care during the day were EKG was done and normal. However in returning home his significant other he insisted he come to the ER. Pain is described as dull, 7/10 at its worse. He reports no cough, no nausea, no vomiting, no trauma. He reports no radiation. He reports no GERD. He reports possible mild pain with very deep inspiration. He reports no wheeze, no shortness of breath. He did have a stress test approximately 5 years ago, it was negative. History obtained from the patient. His cardiologist is Dr. Katrinka Blazing, he was referred to Dr. Katrinka Blazing 5 years ago by his PCP since because of his family history of coronary artery disease.   Active Hospital Problems  Diagnoses Date Noted   . Hyperlipidemia: Continue lipitor. 10/23/2010   . BPH (benign prostatic hyperplasia): No changes      Resolved Hospital Problems  Diagnoses Date Noted Date Resolved  . Chest pain: He was admitted to the telemetry unit. Had an episode of trigeminy. Which resolved. He was started on metoprolol. Cardiac enzymes negative times 2, he really wanted to leave on the day of discharge.  He related that he will follow up with Dr. Katrinka Blazing but he really needed to go today. Has remained asymptomatic. 06/24/2011 06/24/2011     Disposition and Follow-up:  Discharge Orders  Future Orders Please Complete By Expires   Diet - low sodium heart healthy      Increase activity slowly        Follow-up Information    Follow up with Carollee Herter, MD in 1 week. (follow up )       Follow up with Lesleigh Noe, MD in 1 week. (call for appointment)    Contact information:   [address] [place] [address] [place] [address] [phone]           DISCHARGE EXAM:  See progress note.  Blood pressure 110/68, pulse 98, temperature 98.2 F (36.8 C), temperature source Oral, resp. rate 16, height 5\' 8"  (1.727 m), weight 76.4 kg (168 lb 6.9 oz), SpO2 98.00%.   Basename  06/24/11 0520 06/23/11 2320  NA 140 138  K 3.7 3.7  CL 106 102  CO2 26 26  GLUCOSE 107* 106*  BUN 14 17  CREATININE 0.93 1.04  CALCIUM 8.7 9.5  MG -- --  PHOS -- --    Basename 06/23/11 2320  AST 25  ALT 27  ALKPHOS 76  BILITOT 0.5  PROT 7.6  ALBUMIN 3.8   No results found for this basename: LIPASE:2,AMYLASE:2 in the last 72 hours  Basename 06/24/11 0520 06/23/11 2320  WBC 8.1 10.4  NEUTROABS 6.3 8.2*  HGB 14.8 16.0  HCT 41.5 45.3  MCV 89.2 90.2  PLT 171 197    Signed: Marinda Elk M.D. 06/24/2011, 12:46 PM

## 2011-06-24 NOTE — ED Provider Notes (Signed)
History     CSN: [id number]  Arrival date & time 06/23/11  2232   First MD Initiated Contact with Patient 06/23/11 2316      Chief Complaint  Patient presents with  . Chest Pain    sore, dull, worse w/ inspiration and when lying flat.    (Consider location/radiation/quality/duration/timing/severity/associated sxs/prior treatment) HPI Patient presents with complaint of chest pain. He states the pain began approximately 2 AM and almost 24 hours prior to arrival. Pain is located in his right chest and is worse with lying flat. It is described as a dull aching pain. He states that prior to ED evaluation he went to an urgent care and had a normal EKG. He states he came to the emergency department tonight because pain became somewhat worse. He rates pain as 7/10. He denies any shortness of breath or nausea or radiation of the pain. He did not have any diaphoresis until arrival in the ED when he developed diaphoresis while being triaged. He has had no syncope or palpitations. He has no history of heart disease of which he is aware. He states he did have a stress test several years ago which he believes was normal. Chest pain is not worsened with exertion or movement. Seems to be improved with lying flat. He has not taken any medications for the pain except and aspirin earlier this morning. There no other alleviating or modifying factors. There no associated systemic symptoms.  Past Medical History  Diagnosis Date  . BPH (benign prostatic hyperplasia)   . Hyperlipidemia   . Coronary artery disease     Past Surgical History  Procedure Date  . Colonoscopy 2009    MEDOFF  . Hernia repair     Family History  Problem Relation Age of Onset  . Coronary artery disease      History  Substance Use Topics  . Smoking status: Never Smoker   . Smokeless tobacco: Never Used  . Alcohol Use: 0.6 oz/week    1 Glasses of wine per week      Review of Systems ROS reviewed and otherwise negative  except for mentioned in HPI  Allergies  Review of patient's allergies indicates no known allergies.  Home Medications   Current Outpatient Rx  Name Route Sig Dispense Refill  . ASPIRIN 81 MG PO TABS Oral Take 81 mg by mouth daily.      . ATORVASTATIN CALCIUM 10 MG PO TABS Oral Take 1 tablet (10 mg total) by mouth daily. 30 tablet 11  . OMEGA-3 FATTY ACIDS 1000 MG PO CAPS Oral Take 2 g by mouth daily.      Marland Kitchen GLUCOSAMINE-CHONDROITIN 500-400 MG PO TABS Oral Take 1 tablet by mouth 3 (three) times daily.    . MULTI-VITAMIN/MINERALS PO TABS Oral Take 1 tablet by mouth daily.     Marland Kitchen METOPROLOL SUCCINATE ER 25 MG PO TB24 Oral Take 0.5 tablets (12.5 mg total) by mouth daily. 60 tablet 0    BP 109/69  Pulse 92  Temp(Src) 99.3 F (37.4 C) (Oral)  Resp 18  Ht 5\' 8"  (1.727 m)  Wt 168 lb 6.9 oz (76.4 kg)  BMI 25.61 kg/m2  SpO2 96% Vitals reviewed Physical Exam Physical Examination: General appearance - alert, well appearing, and in no distress, concerned appearing Mental status - alert, oriented to person, place, and time Eyes - pupils equal and reactive, no scleral icterus Mouth - mucous membranes moist, pharynx normal without lesions Chest - clear to auscultation, no wheezes,  rales or rhonchi, symmetric air entry, nontender Heart - normal rate, regular rhythm, normal S1, S2, no murmurs, rubs, clicks or gallops Abdomen - soft, nontender, nondistended, no masses or organomegaly Musculoskeletal - no joint tenderness, deformity or swelling Extremities - peripheral pulses normal, no pedal edema, no clubbing or cyanosis Skin - normal coloration and turgor, no rashes, mild diaphoresis of forehead  ED Course  Procedures (including critical care time)   Date: 06/24/2011  Rate: 82  Rhythm: normal sinus rhythm  QRS Axis: normal  Intervals: normal  ST/T Wave abnormalities: normal  Conduction Disutrbances:none  Narrative Interpretation:   Old EKG Reviewed: unchanged compared to  03/05/03  1:25 AM d/w Dr. Joneen Roach, [facility] hospitalist- she states she will see patient in the ED  Labs Reviewed  DIFFERENTIAL - Abnormal; Notable for the following:    Neutrophils Relative 79 (*)    Neutro Abs 8.2 (*)    Lymphocytes Relative 10 (*)    Monocytes Absolute 1.1 (*)    All other components within normal limits  COMPREHENSIVE METABOLIC PANEL - Abnormal; Notable for the following:    Glucose, Bld 106 (*)    GFR calc non Af Amer 72 (*)    GFR calc Af Amer 83 (*)    All other components within normal limits  CARDIAC PANEL(CRET KIN+CKTOT+MB+TROPI) - Abnormal; Notable for the following:    Relative Index 3.0 (*)    All other components within normal limits  LIPID PANEL - Abnormal; Notable for the following:    HDL 34 (*)    All other components within normal limits  BASIC METABOLIC PANEL - Abnormal; Notable for the following:    Glucose, Bld 107 (*)    GFR calc non Af Amer 84 (*)    All other components within normal limits  DIFFERENTIAL - Abnormal; Notable for the following:    Neutrophils Relative 79 (*)    Lymphocytes Relative 6 (*)    Lymphs Abs 0.4 (*)    Monocytes Relative 15 (*)    Monocytes Absolute 1.2 (*)    All other components within normal limits  CBC  D-DIMER, QUANTITATIVE  CBC  D-DIMER, QUANTITATIVE  CARDIAC PANEL(CRET KIN+CKTOT+MB+TROPI)  TROPONIN I  DIFFERENTIAL   Dg Chest 2 View  06/24/2011  *RADIOLOGY REPORT*  Clinical Data: Right-sided chest pain  CHEST - 2 VIEW  Comparison: None.  Findings: Heart size upper normal limits to mildly enlarged. Mildly prominent pulmonary arterial branches centrally.  1 cm nodule periphery left lower lobe is favored to be calcified/sequelae of prior granulomatous infection. Mild rightward curvature of the lower thoracic spine.  No acute osseous abnormality.  IMPRESSION: Heart size upper normal limits, with mild pulmonary arterial prominence.  No acute process identified.  Original Report Authenticated By: Waneta Martins, M.D.     1. Chest pain       MDM  Patient seen and evaluated upon being brought back to the ED room. He had mild diaphoresis and pain 7/10 in his right chest. Otherwise exam was reassuring. His EKG was reassuring as well as normal troponin and chest x-ray. Pain was relieved down to 4/10 with nitroglycerin. I ordered morphine 2 continue to treat the chest pain however patient stated that he did not wish to take the morphine. I discussed the patient with try hospitalist to plan to see the patient in the ED for admission and further evaluation.        Ethelda Chick, MD 06/25/11 [id number]

## 2011-06-24 NOTE — Progress Notes (Signed)
Patient D/C'd home  With girlfriend, discharge instruction given and patient. Patient verbalized understand, denies any distress or pain, rated his pain 0 out 10 pain scale. Skin intact, no wound.  Accompanied home by girlfriend.

## 2011-06-24 NOTE — Progress Notes (Signed)
06/24/11-Received telephone report from Anna,RN in ER Dept. @ 03:50am. Awaiting pt's arrival to floor Room#[id number]  for admission process.

## 2011-06-24 NOTE — ED Notes (Addendum)
PATIENT HAS HAD SEVERAL EPISODES OF TRIGEMINY. RN NOTIFIED. MD notified

## 2011-06-24 NOTE — H&P (Signed)
PCP:   Carollee Herter, MD, MD  Cardiologist in Dr. Katrinka Blazing  Chief Complaint:  Chest pains  HPI: This is a 71 year old gentleman with no known history of coronary artery disease. He states that yesterday he developed right-sided chest pain, no radiation. It woke him from his sleep. He thought it was indigestion and went back to sleep. He woke up this a.m. with the same chest pain right-sided. It persisted all day. He did stop at an urgent care during the day were EKG was done and normal. However in returning home his significant other he insisted he come to the ER. Pain is described as dull, 7/10 at its worse. He reports no cough, no nausea, no vomiting, no trauma. He reports no radiation. He reports no GERD. He reports possible mild pain with very deep inspiration. He reports no wheeze, no shortness of breath. He did have a stress test approximately 5 years ago, it was negative. History obtained from the patient. His cardiologist is Dr. Katrinka Blazing, he was referred to Dr. Katrinka Blazing 5 years ago by his PCP since because of his family history of coronary artery disease.  Review of Systems: Positives bolded   anorexia, fever, weight loss,, vision loss, decreased hearing, hoarseness, chest pain, syncope, dyspnea on exertion, peripheral edema, balance deficits, hemoptysis, abdominal pain, melena, hematochezia, severe indigestion/heartburn, hematuria, incontinence, genital sores, muscle weakness, suspicious skin lesions, transient blindness, difficulty walking, depression, unusual weight change, abnormal bleeding, enlarged lymph nodes, angioedema, and breast masses.  Past Medical History: Past Medical History  Diagnosis Date  . BPH (benign prostatic hyperplasia)   . Hyperlipidemia    Past Surgical History  Procedure Date  . Colonoscopy 2009    MEDOFF  . Hernia repair     Medications: Prior to Admission medications   Medication Sig Start Date End Date Taking? Authorizing Provider  aspirin 81 MG tablet  Take 81 mg by mouth daily.     Yes Historical Provider, MD  atorvastatin (LIPITOR) 10 MG tablet Take 1 tablet (10 mg total) by mouth daily. 10/10/10 10/10/11 Yes Carollee Herter, MD  fish oil-omega-3 fatty acids 1000 MG capsule Take 2 g by mouth daily.     Yes Historical Provider, MD  glucosamine-chondroitin 500-400 MG tablet Take 1 tablet by mouth 3 (three) times daily.   Yes Historical Provider, MD  Multiple Vitamins-Minerals (MULTIVITAMIN WITH MINERALS) tablet Take 1 tablet by mouth daily.    Yes Historical Provider, MD    Allergies:  No Known Allergies  Social History:  reports that he has never smoked. He has never used smokeless tobacco. He reports that he drinks about .6 ounces of alcohol per week. He reports that he does not use illicit drugs.  Family History: Family History  Problem Relation Age of Onset  . Coronary artery disease      Physical Exam: Filed Vitals:   06/23/11 2315 06/23/11 2323 06/23/11 2330 06/24/11 0129  BP: 141/76 134/73 126/75 115/55  Pulse: 83 83 86 101  Temp:      TempSrc:      Resp: 13 13 18    Weight:      SpO2: 100% 100% 100% 98%    General:  Alert and oriented times three, well developed and nourished, no acute distress Eyes: PERRLA, pink conjunctiva, no scleral icterus ENT: Moist oral mucosa, neck supple, no thyromegaly Lungs: clear to ascultation, no wheeze, no crackles, no use of accessory muscles Cardiovascular: regular rate and rhythm, no regurgitation, no gallops, no murmurs. No carotid bruits,  no JVD Abdomen: soft, positive BS, non-tender, non-distended, no organomegaly, not an acute abdomen GU: not examined Neuro: CN II - XII grossly intact, sensation intact Musculoskeletal: strength 5/5 all extremities, no clubbing, cyanosis or edema, no reproducible chest wall pain Skin: no rash, no subcutaneous crepitation, no decubitus Psych: appropriate patient   Labs on Admission:   [facility] 06/23/11 2320  NA 138  K 3.7  CL 102  CO2  26  GLUCOSE 106*  BUN 17  CREATININE 1.04  CALCIUM 9.5  MG --  PHOS --    Basename 06/23/11 2320  AST 25  ALT 27  ALKPHOS 76  BILITOT 0.5  PROT 7.6  ALBUMIN 3.8   No results found for this basename: LIPASE:2,AMYLASE:2 in the last 72 hours  Basename 06/23/11 2320  WBC 10.4  NEUTROABS 8.2*  HGB 16.0  HCT 45.3  MCV 90.2  PLT 197    Basename 06/23/11 2320  CKTOTAL 122  CKMB 3.6  CKMBINDEX --  TROPONINI <0.30   No components found with this basename: POCBNP:3  Basename 06/23/11 2320  DDIMER 0.31   No results found for this basename: HGBA1C:2 in the last 72 hours No results found for this basename: CHOL:2,HDL:2,LDLCALC:2,TRIG:2,CHOLHDL:2,LDLDIRECT:2 in the last 72 hours No results found for this basename: TSH,T4TOTAL,FREET3,T3FREE,THYROIDAB in the last 72 hours No results found for this basename: VITAMINB12:2,FOLATE:2,FERRITIN:2,TIBC:2,IRON:2,RETICCTPCT:2 in the last 72 hours  Micro Results: No results found for this or any previous visit (from the past 240 hour(s)).   Radiological Exams on Admission: Dg Chest 2 View  06/24/2011  *RADIOLOGY REPORT*  Clinical Data: Right-sided chest pain  CHEST - 2 VIEW  Comparison: None.  Findings: Heart size upper normal limits to mildly enlarged. Mildly prominent pulmonary arterial branches centrally.  1 cm nodule periphery left lower lobe is favored to be calcified/sequelae of prior granulomatous infection. Mild rightward curvature of the lower thoracic spine.  No acute osseous abnormality.  IMPRESSION: Heart size upper normal limits, with mild pulmonary arterial prominence.  No acute process identified.  Original Report Authenticated By: Waneta Martins, M.D.   EKG normal sinus  Assessment/Plan Present on Admission:  .right Chest pain Admit to telemetry  Cycle cardiac enzymes Aspirin, lipid panel in a.m. Nitroglycerin when necessary, morphine when necessary pain  Doubt cardiac etiology .BPH (benign prostatic  hyperplasia) .Hyperlipidemia Lipid panel in a.m. Stable  Full code DVT prophylaxis Team 5/Dr. Renee Pain    Eliese Kerwood 06/24/2011, 2:55 AM

## 2011-06-24 NOTE — ED Notes (Signed)
Pt returned from xray

## 2011-06-24 NOTE — ED Notes (Signed)
MD at bedside. Admitting MD.

## 2011-06-24 NOTE — Progress Notes (Signed)
Patient  Had some trigeminy and quad PVCs, denies any pain or distress, in bed resting; Dr David Stall notified, at the bedside to assess patient, no new order given. Will continue to assess patient.

## 2011-06-28 ENCOUNTER — Ambulatory Visit (INDEPENDENT_AMBULATORY_CARE_PROVIDER_SITE_OTHER): Payer: Medicare Other | Admitting: Family Medicine

## 2011-06-28 ENCOUNTER — Encounter: Payer: Self-pay | Admitting: Family Medicine

## 2011-06-28 VITALS — BP 128/70 | HR 83 | Ht 66.0 in | Wt 168.0 lb

## 2011-06-28 DIAGNOSIS — R0789 Other chest pain: Secondary | ICD-10-CM

## 2011-06-28 NOTE — Progress Notes (Signed)
  Subjective:    Patient ID: Logan Banks, male    DOB: 19-May-1941, 71 y.o.   MRN: [id number]  HPI He is here for consultation concerning a one-month history of right-sided chest pain. The pain is made worse with deep breathing, movement and lying on the right side while sleeping. He was recently evaluated in the emergency room for the discomfort. That record was reviewed and he has no evidence of cardiac disease. He did recently have hemorrhoid banding and was told to avoid anti-inflammatories. He had tried minimal amounts of Tylenol and aspirin. The pain has been relatively constant.   Review of Systems     Objective:   Physical Exam alert and in no distress. Tympanic membranes and canals are normal. Throat is clear. Tonsils are normal. Neck is supple without adenopathy or thyromegaly. Cardiac exam shows a regular sinus rhythm without murmurs or gallops. Lungs are clear to auscultation. Chest wall tenderness     Assessment & Plan:   1. Chest pain, musculoskeletal    initially use Tylenol. If no improvement, he will call

## 2011-06-28 NOTE — Patient Instructions (Signed)
2 Tylenol 4 times per day for the next 7-10 days

## 2011-07-04 ENCOUNTER — Encounter: Payer: Self-pay | Admitting: Internal Medicine

## 2011-08-15 ENCOUNTER — Other Ambulatory Visit: Payer: Self-pay | Admitting: Gastroenterology

## 2011-08-22 ENCOUNTER — Ambulatory Visit
Admission: RE | Admit: 2011-08-22 | Discharge: 2011-08-22 | Disposition: A | Discharge status: Home / Self Care | Payer: Medicare Other | Source: Ambulatory Visit | Attending: Gastroenterology | Admitting: Gastroenterology

## 2011-09-14 ENCOUNTER — Encounter: Payer: Self-pay | Admitting: Gastroenterology

## 2011-11-11 ENCOUNTER — Other Ambulatory Visit: Payer: Self-pay | Admitting: Family Medicine

## 2011-12-12 ENCOUNTER — Ambulatory Visit (INDEPENDENT_AMBULATORY_CARE_PROVIDER_SITE_OTHER): Payer: Medicare Other | Admitting: Family Medicine

## 2011-12-12 ENCOUNTER — Encounter: Payer: Self-pay | Admitting: Family Medicine

## 2011-12-12 VITALS — BP 112/76 | HR 78 | Ht 68.0 in | Wt 165.0 lb

## 2011-12-12 DIAGNOSIS — G4700 Insomnia, unspecified: Secondary | ICD-10-CM

## 2011-12-12 DIAGNOSIS — N400 Enlarged prostate without lower urinary tract symptoms: Secondary | ICD-10-CM

## 2011-12-12 DIAGNOSIS — Z0000 Encounter for general adult medical examination without abnormal findings: Secondary | ICD-10-CM

## 2011-12-12 DIAGNOSIS — Z8249 Family history of ischemic heart disease and other diseases of the circulatory system: Secondary | ICD-10-CM

## 2011-12-12 DIAGNOSIS — E785 Hyperlipidemia, unspecified: Secondary | ICD-10-CM

## 2011-12-12 DIAGNOSIS — Z125 Encounter for screening for malignant neoplasm of prostate: Secondary | ICD-10-CM

## 2011-12-12 DIAGNOSIS — J45990 Exercise induced bronchospasm: Secondary | ICD-10-CM

## 2011-12-12 LAB — POCT URINALYSIS DIPSTICK
Leukocytes, UA: NEGATIVE
Nitrite, UA: NEGATIVE
Protein, UA: NEGATIVE
Urobilinogen, UA: NEGATIVE
pH, UA: 5

## 2011-12-12 LAB — LIPID PANEL
LDL Cholesterol: 79 mg/dL (ref 0–99)
VLDL: 13 mg/dL (ref 0–40)

## 2011-12-12 MED ORDER — ATORVASTATIN CALCIUM 10 MG PO TABS: 10.0000 mg | ORAL_TABLET | Freq: Every day | ORAL | Status: DC

## 2011-12-12 MED ORDER — ALBUTEROL SULFATE HFA 108 (90 BASE) MCG/ACT IN AERS: 2.0000 | INHALATION_SPRAY | Freq: Four times a day (QID) | RESPIRATORY_TRACT | Status: DC | PRN

## 2011-12-12 NOTE — Progress Notes (Signed)
Subjective:    Patient ID: Logan Banks, male    DOB: 12-04-1940, 71 y.o.   MRN: [id number]  HPI He is here for a general checkup. He has had some difficulty since his wife died with sleep disturbance. He does wake up several times at night. He admits to drinking tea at night and at least one of the episodes waking up has to do with emptying his bladder. He would like a refill on his Proventil. He rarely uses this for exercise-induced problems. He continues on Lipitor and is having no difficulty with this. He is sexually active and now dating 31. He is having no erections issues. He has had a colonoscopy recently. His immunizations are up to date. He continues to work and has no plans to retire. Review of Systems  Constitutional: Negative.   Eyes: Negative.   Respiratory: Negative.   Cardiovascular: Negative.   Gastrointestinal: Negative.   Genitourinary: Negative.   Skin: Negative.   Neurological: Negative.   Hematological: Negative.   Psychiatric/Behavioral: Negative.        Objective:   Physical Exam BP 112/76  Pulse 78  Ht 5\' 8"  (1.727 m)  Wt 165 lb (74.844 kg)  BMI 25.09 kg/m2  SpO2 98%  General Appearance:    Alert, cooperative, no distress, appears stated age  Head:    Normocephalic, without obvious abnormality, atraumatic  Eyes:    PERRL, conjunctiva/corneas clear, EOM's intact, fundi    benign  Ears:    Normal TM's and external ear canals  Nose:   Nares normal, mucosa normal, no drainage or sinus   tenderness  Throat:   Lips, mucosa, and tongue normal; teeth and gums normal  Neck:   Supple, no lymphadenopathy;  thyroid:  no   enlargement/tenderness/nodules; no carotid   bruit or JVD  Back:    Spine nontender, no curvature, ROM normal, no CVA     tenderness  Lungs:     Clear to auscultation bilaterally without wheezes, rales or     ronchi; respirations unlabored  Chest Wall:    No tenderness or deformity   Heart:    Regular rate and rhythm, S1 and S2 normal, no  murmur, rub   or gallop  Breast Exam:    No chest wall tenderness, masses or gynecomastia  Abdomen:     Soft, non-tender, nondistended, normoactive bowel sounds,    no masses, no hepatosplenomegaly  Genitalia:   deferred   Rectal:   deferred   Extremities:   No clubbing, cyanosis or edema  Pulses:   2+ and symmetric all extremities  Skin:   Skin color, texture, turgor normal, no rashes or lesions  Lymph nodes:   Cervical, supraclavicular, and axillary nodes normal  Neurologic:   CNII-XII intact, normal strength, sensation and gait; reflexes 2+ and symmetric throughout          Psych:   Normal mood, affect, hygiene and grooming.           Assessment & Plan:   1. Routine general medical examination at a health care facility  POCT Urinalysis Dipstick, PSA, Medicare  2. Exercise-induced asthma  albuterol (PROVENTIL HFA;VENTOLIN HFA) 108 (90 BASE) MCG/ACT inhaler  3. Hyperlipidemia LDL goal < 100  atorvastatin (LIPITOR) 10 MG tablet, Lipid panel  4. Special screening for malignant neoplasm of prostate    5. Insomnia    6. Family history of heart disease in male family member before age 77    7. BPH (benign prostatic hyperplasia)  information on insomnia given tohim. We also discussed end-of-life issues. I did give him the MOST warm. He has had discussions with his children concerning end-of-life issues.

## 2011-12-12 NOTE — Patient Instructions (Signed)

## 2011-12-13 NOTE — Progress Notes (Signed)
Left message for pt labs look good 

## 2012-01-22 ENCOUNTER — Other Ambulatory Visit: Payer: Self-pay | Admitting: Dermatology

## 2012-03-21 ENCOUNTER — Other Ambulatory Visit: Payer: Self-pay | Admitting: Family Medicine

## 2012-03-21 NOTE — Telephone Encounter (Signed)
Called in xanax per jcl 

## 2012-03-21 NOTE — Telephone Encounter (Signed)
Ok to renew?  

## 2012-03-21 NOTE — Telephone Encounter (Signed)
Is this ok?

## 2012-12-15 ENCOUNTER — Encounter: Payer: Self-pay | Admitting: Family Medicine

## 2012-12-15 ENCOUNTER — Ambulatory Visit (INDEPENDENT_AMBULATORY_CARE_PROVIDER_SITE_OTHER): Payer: Medicare Other | Admitting: Family Medicine

## 2012-12-15 VITALS — BP 112/70 | HR 65 | Ht 67.0 in | Wt 177.0 lb

## 2012-12-15 DIAGNOSIS — Z23 Encounter for immunization: Secondary | ICD-10-CM

## 2012-12-15 DIAGNOSIS — E785 Hyperlipidemia, unspecified: Secondary | ICD-10-CM

## 2012-12-15 DIAGNOSIS — Z0000 Encounter for general adult medical examination without abnormal findings: Secondary | ICD-10-CM

## 2012-12-15 DIAGNOSIS — Z8249 Family history of ischemic heart disease and other diseases of the circulatory system: Secondary | ICD-10-CM

## 2012-12-15 DIAGNOSIS — Z125 Encounter for screening for malignant neoplasm of prostate: Secondary | ICD-10-CM

## 2012-12-15 DIAGNOSIS — N400 Enlarged prostate without lower urinary tract symptoms: Secondary | ICD-10-CM

## 2012-12-15 DIAGNOSIS — J301 Allergic rhinitis due to pollen: Secondary | ICD-10-CM

## 2012-12-15 LAB — POCT URINALYSIS DIPSTICK
Bilirubin, UA: NEGATIVE
Glucose, UA: NEGATIVE
Leukocytes, UA: NEGATIVE
Nitrite, UA: NEGATIVE
Urobilinogen, UA: NEGATIVE

## 2012-12-15 MED ORDER — ATORVASTATIN CALCIUM 10 MG PO TABS: 10.0000 mg | ORAL_TABLET | Freq: Every day | ORAL | Status: DC

## 2012-12-15 NOTE — Progress Notes (Signed)
Subjective:    Patient ID: Logan Banks, male    DOB: 1940-12-10, 72 y.o.   MRN: [id number]  HPI He is here for complete examination. His main complaint today is left sided neck and shoulder discomfort that occurred after he worked out at [employer] doing some vigorous physical activities. This is slowly getting better. His allergies seem to be under good control. He does complain of decreased urinary stream but not enough that it is interfering with the quality of his life. He continues on medications listed in the chart. He has no other concerns or complaints. He continues to work. He is sexually active. He will be scheduled for colonoscopy in the near future. His immunizations were reviewed. He does have an advanced directive Review of Systems  Constitutional: Negative.   HENT: Negative.   Eyes: Negative.   Respiratory: Negative.   Cardiovascular: Negative.   Gastrointestinal: Negative.   Endocrine: Negative.   Genitourinary: Negative.   Allergic/Immunologic: Negative.   Neurological: Negative.   Hematological: Negative.   Psychiatric/Behavioral: Negative.        Objective:   Physical Exam BP 112/70  Pulse 65  Ht 5\' 7"  (1.702 m)  Wt 177 lb (80.287 kg)  BMI 27.72 kg/m2  SpO2 99%  General Appearance:    Alert, cooperative, no distress, appears stated age  Head:    Normocephalic, without obvious abnormality, atraumatic  Eyes:    PERRL, conjunctiva/corneas clear, EOM's intact, fundi    benign   Ears:    Normal TM's and external ear canals  Nose:   Nares normal, mucosa normal, no drainage or sinus   tenderness  Throat:   Lips, mucosa, and tongue normal; teeth and gums normal  Neck:   Supple, no lymphadenopathy;  thyroid:  no   enlargement/tenderness/nodules; no carotid   bruit or JVD  Back:    Spine nontender, no curvature, ROM normal, no CVA     tenderness  Lungs:     Clear to auscultation bilaterally without wheezes, rales or     ronchi; respirations unlabored  Chest Wall:     No tenderness or deformity   Heart:    Regular rate and rhythm, S1 and S2 normal, no murmur, rub   or gallop  Breast Exam:    No chest wall tenderness, masses or gynecomastia  Abdomen:     Soft, non-tender, nondistended, normoactive bowel sounds,    no masses, no hepatosplenomegaly        Extremities:   No clubbing, cyanosis or edema  Pulses:   2+ and symmetric all extremities  Skin:   Skin color, texture, turgor normal, no rashes or lesions  Lymph nodes:   Cervical, supraclavicular, and axillary nodes normal  Neurologic:   CNII-XII intact, normal strength, sensation and gait; reflexes 2+ and symmetric throughout          Psych:   Normal mood, affect, hygiene and grooming.          Assessment & Plan:  Routine general medical examination at a health care facility - Plan: Pneumococcal polysaccharide vaccine 23-valent greater than or equal to 2yo subcutaneous/IM, CBC with Differential, Comprehensive metabolic panel, Lipid panel  Hyperlipidemia - Plan: POCT Urinalysis Dipstick, atorvastatin (LIPITOR) 10 MG tablet, Lipid panel  Allergic rhinitis due to pollen  BPH (benign prostatic hyperplasia) - Plan: PSA, Medicare  Family history of heart disease in male family member before age 16 - Plan: Lipid panel  Hyperlipidemia LDL goal < 100 - Plan: atorvastatin (LIPITOR) 10 MG  tablet  Special screening for malignant neoplasm of prostate - Plan: PSA, Medicare  encouraged him to continue with his very active lifestyle.

## 2012-12-16 LAB — COMPREHENSIVE METABOLIC PANEL
ALT: 28 U/L (ref 0–53)
AST: 25 U/L (ref 0–37)
Albumin: 4.2 g/dL (ref 3.5–5.2)
Alkaline Phosphatase: 53 U/L (ref 39–117)
Glucose, Bld: 92 mg/dL (ref 70–99)
Potassium: 4.5 mEq/L (ref 3.5–5.3)
Sodium: 140 mEq/L (ref 135–145)
Total Bilirubin: 0.6 mg/dL (ref 0.3–1.2)
Total Protein: 6.9 g/dL (ref 6.0–8.3)

## 2012-12-16 LAB — PSA, MEDICARE: PSA: 0.81 ng/mL (ref <=4.00)

## 2012-12-16 LAB — CBC WITH DIFFERENTIAL/PLATELET
Basophils Relative: 0 % (ref 0–1)
Eosinophils Absolute: 0.1 10*3/uL (ref 0.0–0.7)
Hemoglobin: 15 g/dL (ref 13.0–17.0)
Lymphs Abs: 0.9 10*3/uL (ref 0.7–4.0)
MCHC: 35 g/dL (ref 30.0–36.0)
Monocytes Relative: 9 % (ref 3–12)
Neutro Abs: 3.9 10*3/uL (ref 1.7–7.7)
Neutrophils Relative %: 73 % (ref 43–77)
Platelets: 210 10*3/uL (ref 150–400)
RBC: 4.85 MIL/uL (ref 4.22–5.81)

## 2012-12-16 LAB — LIPID PANEL
LDL Cholesterol: 71 mg/dL (ref 0–99)
Triglycerides: 51 mg/dL (ref <150)
VLDL: 10 mg/dL (ref 0–40)

## 2012-12-16 NOTE — Progress Notes (Signed)
Quick Note:  MAILED PT COPY OF LAB ______

## 2012-12-16 NOTE — Progress Notes (Signed)
Quick Note:  The blood work is normal ______ 

## 2012-12-26 ENCOUNTER — Other Ambulatory Visit: Payer: Self-pay | Admitting: Family Medicine

## 2012-12-26 NOTE — Telephone Encounter (Signed)
Have him schedule an appointment to see me first

## 2012-12-29 ENCOUNTER — Ambulatory Visit (INDEPENDENT_AMBULATORY_CARE_PROVIDER_SITE_OTHER): Payer: Medicare Other | Admitting: Family Medicine

## 2012-12-29 DIAGNOSIS — S161XXS Strain of muscle, fascia and tendon at neck level, sequela: Secondary | ICD-10-CM

## 2012-12-29 DIAGNOSIS — IMO0002 Reserved for concepts with insufficient information to code with codable children: Secondary | ICD-10-CM

## 2012-12-29 DIAGNOSIS — K1370 Unspecified lesions of oral mucosa: Secondary | ICD-10-CM

## 2012-12-29 MED ORDER — METAXALONE 800 MG PO TABS: 800.0000 mg | ORAL_TABLET | Freq: Three times a day (TID) | ORAL | Status: DC

## 2012-12-29 NOTE — Patient Instructions (Addendum)
Use heat for 20 minutes three times a day. Tylenol for pain and the muscle relaxer as needed at night

## 2012-12-29 NOTE — Progress Notes (Signed)
  Subjective:    Patient ID: Logan Banks, male    DOB: [birth year], 72 y.o.   MRN: [id number]  HPI He is here for evaluation of continued difficulty with left-sided neck pain that started approximately 6 weeks ago when he was doing a some exercises at the gym. Since then he has had some left-sided neck and upper shoulder discomfort. He has been using Tylenol with some relief. He also has a lesion in his left jaw area that he would like me to look at.  Review of Systems     Objective:   Physical Exam Slight tenderness to palpation and fullness is noted in the left posterior lateral cervical area. Normal motor sensory DTRs. No trapezius discomfort. Exam of the left jaw masseter area does show a 1 cm smooth round and questionably movable lesion.       Assessment & Plan:  Cervical strain, acute, sequela - Plan: metaxalone (SKELAXIN) 800 MG tablet  Lesion of mouth  recommend heat and stretching as well as Skelaxin for the neck. We'll also refer to physical therapy at his request. If the lesion on his left masseter remains, history turned here in one month for recheck.

## 2013-02-16 ENCOUNTER — Encounter: Payer: Self-pay | Admitting: Internal Medicine

## 2013-03-18 ENCOUNTER — Ambulatory Visit (INDEPENDENT_AMBULATORY_CARE_PROVIDER_SITE_OTHER): Payer: Medicare Other | Admitting: Family Medicine

## 2013-03-18 ENCOUNTER — Encounter: Payer: Self-pay | Admitting: Family Medicine

## 2013-03-18 VITALS — BP 120/70 | HR 89 | Wt 177.0 lb

## 2013-03-18 DIAGNOSIS — M67919 Unspecified disorder of synovium and tendon, unspecified shoulder: Secondary | ICD-10-CM

## 2013-03-18 DIAGNOSIS — M7581 Other shoulder lesions, right shoulder: Secondary | ICD-10-CM

## 2013-03-18 NOTE — Progress Notes (Signed)
  Subjective:    Patient ID: Logan Banks, male    DOB: April 03, 1941, 72 y.o.   MRN: [id number]  HPI He complains of a three-month history of right shoulder pain. No change in his physical activity or history of injury. He notes especially having difficulty with abduction and external rotation.   Review of Systems     Objective:   Physical Exam Full motion of the right shoulder. No tenderness to palpation. Drop arm test was uncomfortable. His Neer's and Hawkins test was also uncomfortable.       Assessment & Plan:  Rotator cuff tendinitis, right  discussed options with him and have decided to inject the shoulder. The shoulder was prepped with Betadine and 40 mg of Kenalog and 3 cc of Xylocaine was obstructed into the subacromial bursa without difficulty. He tolerated the procedure well and did get some fairly quick benefit . Then mentioned continued difficulty with left trapezius pain. Apparently physical therapy did not work. I mentioned massage and possible chiropractic manipulation.

## 2013-03-20 ENCOUNTER — Telehealth: Payer: Self-pay | Admitting: Internal Medicine

## 2013-03-20 NOTE — Telephone Encounter (Signed)
Schedule him for shoulder MRI

## 2013-03-20 NOTE — Telephone Encounter (Signed)
Referral

## 2013-03-20 NOTE — Telephone Encounter (Signed)
Pt states he is still having pain in his shoulder. The shot worked for about a day he said then started hurting again. He didn't know if he needed to come back in for another shot or not

## 2013-03-23 ENCOUNTER — Telehealth: Payer: Self-pay

## 2013-03-23 ENCOUNTER — Other Ambulatory Visit: Payer: Self-pay

## 2013-03-23 DIAGNOSIS — M25511 Pain in right shoulder: Secondary | ICD-10-CM

## 2013-03-23 NOTE — Telephone Encounter (Signed)
[facility] [employer] APROVED MRI RIGHT SHOULDER GOOD FOR 45 DAYS [phone] GOOD FOR 45 DAYS EXP: 05/07/13

## 2013-03-23 NOTE — Telephone Encounter (Signed)
I will need to evaluate it before we do anything

## 2013-03-23 NOTE — Telephone Encounter (Signed)
PT CALLED BACK HE HAD TO CHANGE HIS MRI TIME AND DATE BUT HE WANTED ME TO ASK ABOUT HIS LEFT SHOULDER TOO HE SAID IT ALSO IS CAUSING PAIN PLEASE ADVISE

## 2013-03-23 NOTE — Telephone Encounter (Signed)
PT HAS APPOINTMENT WED.

## 2013-03-25 ENCOUNTER — Encounter: Payer: Self-pay | Admitting: Family Medicine

## 2013-03-25 ENCOUNTER — Ambulatory Visit (INDEPENDENT_AMBULATORY_CARE_PROVIDER_SITE_OTHER): Payer: Medicare Other | Admitting: Family Medicine

## 2013-03-25 DIAGNOSIS — R141 Gas pain: Secondary | ICD-10-CM

## 2013-03-25 DIAGNOSIS — R140 Abdominal distension (gaseous): Secondary | ICD-10-CM

## 2013-03-25 DIAGNOSIS — M542 Cervicalgia: Secondary | ICD-10-CM

## 2013-03-25 NOTE — Patient Instructions (Addendum)
Called Logan Banks [phone] Keep track of what foods tend to cause trouble with bloating.

## 2013-03-25 NOTE — Progress Notes (Signed)
  Subjective:    Patient ID: Logan Banks, male    DOB: 1940/05/30, 72 y.o.   MRN: [id number]  HPI He has a 6 month history of left shoulder pain started after he was lifting weights. He describes the pain is in the neck and shoulder area. It radiates into the shoulder. He notes that with neck rotation, the pain gets worse. No numbness, tingling or weakness. He also complains of a several week history of abdominal bloating with food. He cannot relate this to any particular kind of food. He's had no vomiting or diarrhea.  Review of Systems     Objective:   Physical Exam Full motion of the neck. There is some discomfort with right lateral motion of the neck. Normal motor, sensory and DTRs. No palpable tenderness specifically trigger points. Abdominal exam shows no masses or tenderness.       Assessment & Plan:  Neck pain on left side  Abdominal bloating  recommended chiropractic manipulation. Recommended that he see Darrick Penna. He is also to keep track of his abdominal symptoms to see if there is any pattern especially to particular foods.

## 2013-03-26 ENCOUNTER — Other Ambulatory Visit: Payer: Medicare Other

## 2013-04-01 ENCOUNTER — Ambulatory Visit
Admission: RE | Admit: 2013-04-01 | Discharge: 2013-04-01 | Disposition: A | Discharge status: Home / Self Care | Payer: Medicare Other | Source: Ambulatory Visit | Attending: Family Medicine | Admitting: Family Medicine

## 2013-04-01 DIAGNOSIS — M25511 Pain in right shoulder: Secondary | ICD-10-CM

## 2013-04-02 ENCOUNTER — Telehealth: Payer: Self-pay | Admitting: Family Medicine

## 2013-04-02 MED ORDER — ALPRAZOLAM 0.25 MG PO TABS: ORAL_TABLET | ORAL | Status: DC

## 2013-04-02 NOTE — Telephone Encounter (Signed)
Pt was scheduled to have his MRI done last night, and he was not able to go through with it due to being claustrophobic.  He would like to get it rescheduled, and [facility] [address] So he is able to get through it.  Please call patient and let him know when his rescheduled apt will be.

## 2013-04-02 NOTE — Telephone Encounter (Signed)
Will you call in a sedative for the pt. [facility] [address]

## 2013-04-02 NOTE — Telephone Encounter (Signed)
Call in Xanax 0.25mg . #6

## 2013-04-02 NOTE — Telephone Encounter (Signed)
Called in med 

## 2013-04-02 NOTE — Telephone Encounter (Signed)
Left message stating pt can call GI and reschedule his appt that we do not have to do that for him. He can pick the time he wants when he calls

## 2013-04-05 ENCOUNTER — Ambulatory Visit
Admission: RE | Admit: 2013-04-05 | Discharge: 2013-04-05 | Disposition: A | Discharge status: Home / Self Care | Payer: Medicare Other | Source: Ambulatory Visit | Attending: Family Medicine | Admitting: Family Medicine

## 2013-04-08 ENCOUNTER — Other Ambulatory Visit: Payer: Self-pay | Admitting: Chiropractic Medicine

## 2013-04-08 ENCOUNTER — Ambulatory Visit
Admission: RE | Admit: 2013-04-08 | Discharge: 2013-04-08 | Disposition: A | Discharge status: Home / Self Care | Payer: Medicare Other | Source: Ambulatory Visit | Attending: Chiropractic Medicine | Admitting: Chiropractic Medicine

## 2013-04-08 DIAGNOSIS — M542 Cervicalgia: Secondary | ICD-10-CM

## 2013-04-09 ENCOUNTER — Telehealth: Payer: Self-pay | Admitting: Family Medicine

## 2013-04-14 NOTE — Telephone Encounter (Signed)
fyi

## 2013-06-22 ENCOUNTER — Encounter: Payer: Self-pay | Admitting: Family Medicine

## 2013-06-22 ENCOUNTER — Ambulatory Visit (INDEPENDENT_AMBULATORY_CARE_PROVIDER_SITE_OTHER): Payer: Medicare HMO | Admitting: Family Medicine

## 2013-06-22 VITALS — BP 110/70 | HR 76 | Temp 98.4°F | Wt 173.0 lb

## 2013-06-22 DIAGNOSIS — J111 Influenza due to unidentified influenza virus with other respiratory manifestations: Secondary | ICD-10-CM

## 2013-06-22 NOTE — Progress Notes (Signed)
   Subjective:    Patient ID: Logan Banks, male    DOB: 04/06/1941, 73 y.o.   MRN: [id number]  HPI Two-day history of fever, chills, headache, fatigue, myalgias with fatigue.   Review of Systems     Objective:   Physical Exam alert and in no distress. Tympanic membranes and canals are normal. Throat is clear. Tonsils are normal. Neck is supple without adenopathy or thyromegaly. Cardiac exam shows a regular sinus rhythm without murmurs or gallops. Lungs are clear to auscultation.        Assessment & Plan:  Influenza  recommend supportive care. Call if further difficulty.

## 2013-06-22 NOTE — Patient Instructions (Signed)
You can take Tylenol for fever aches and pains

## 2013-08-21 ENCOUNTER — Telehealth: Payer: Self-pay | Admitting: Interventional Cardiology

## 2013-08-21 NOTE — Telephone Encounter (Signed)
New message    Patient wants to know to what's the different between fish oil and Co10

## 2013-08-21 NOTE — Telephone Encounter (Signed)
Pt is aware that Co Q 10 is given for patients that are having symptoms taking statins; like muscle ache. Pt states will continue taking Fish oil pills as prescribed per cardiologist.

## 2013-11-12 ENCOUNTER — Ambulatory Visit (INDEPENDENT_AMBULATORY_CARE_PROVIDER_SITE_OTHER): Payer: Medicare HMO | Admitting: Family Medicine

## 2013-11-12 ENCOUNTER — Encounter: Payer: Self-pay | Admitting: Family Medicine

## 2013-11-12 ENCOUNTER — Telehealth: Payer: Self-pay | Admitting: Family Medicine

## 2013-11-12 VITALS — BP 110/70 | HR 72 | Ht 68.0 in | Wt 169.0 lb

## 2013-11-12 DIAGNOSIS — Z79899 Other long term (current) drug therapy: Secondary | ICD-10-CM

## 2013-11-12 DIAGNOSIS — R5381 Other malaise: Secondary | ICD-10-CM

## 2013-11-12 DIAGNOSIS — Z23 Encounter for immunization: Secondary | ICD-10-CM

## 2013-11-12 DIAGNOSIS — Z8249 Family history of ischemic heart disease and other diseases of the circulatory system: Secondary | ICD-10-CM

## 2013-11-12 DIAGNOSIS — J301 Allergic rhinitis due to pollen: Secondary | ICD-10-CM

## 2013-11-12 DIAGNOSIS — Z0000 Encounter for general adult medical examination without abnormal findings: Secondary | ICD-10-CM

## 2013-11-12 DIAGNOSIS — E785 Hyperlipidemia, unspecified: Secondary | ICD-10-CM

## 2013-11-12 DIAGNOSIS — R5383 Other fatigue: Secondary | ICD-10-CM

## 2013-11-12 DIAGNOSIS — N400 Enlarged prostate without lower urinary tract symptoms: Secondary | ICD-10-CM

## 2013-11-12 LAB — POCT URINALYSIS DIPSTICK
BILIRUBIN UA: NEGATIVE
Glucose, UA: NEGATIVE
KETONES UA: NEGATIVE
Leukocytes, UA: NEGATIVE
Nitrite, UA: NEGATIVE
PH UA: 6
Protein, UA: NEGATIVE
RBC UA: NEGATIVE
SPEC GRAV UA: 1.015
Urobilinogen, UA: NEGATIVE

## 2013-11-12 NOTE — Progress Notes (Signed)
   Subjective:    Patient ID: Logan Banks, male    DOB: 02/19/1941, 73 y.o.   MRN: [id number]  HPI He is here for a complete examination. He does have some difficulty with BPH symptoms of decreased urinary team. He states his sleep patterns are not as good as he would like and dates as to when his wife died several years ago. He does complain of fatigue. He has had some slight sinus tenderness. He does have a previous history of smoking. His allergies are under good control. He continues on medications listed in the chart. He is not use Xanax in quite some time. He continues on Lipitor and is having no difficulty with that. He works at a [employer]. He is single and is sexually active. His social history were otherwise reviewed. He does have an advanced directive which is essentially in his well.  Review of Systems  All other systems reviewed and are negative.      Objective:   Physical Exam BP 110/70  Pulse 72  Ht 5\' 8"  (1.727 m)  Wt 169 lb (76.658 kg)  BMI 25.70 kg/m2  General Appearance:    Alert, cooperative, no distress, appears stated age  Head:    Normocephalic, without obvious abnormality, atraumatic  Eyes:    PERRL, conjunctiva/corneas clear, EOM's intact, fundi    benign  Ears:    Normal TM's and external ear canals  Nose:   Nares normal, mucosa normal, no drainage or sinus   tenderness  Throat:   Lips, mucosa, and tongue normal; teeth and gums normal  Neck:   Supple, no lymphadenopathy;  thyroid:  no   enlargement/tenderness/nodules; no carotid   bruit or JVD  Back:    Spine nontender, no curvature, ROM normal, no CVA     tenderness  Lungs:     Clear to auscultation bilaterally without wheezes, rales or     ronchi; respirations unlabored  Chest Wall:    No tenderness or deformity   Heart:    Regular rate and rhythm, S1 and S2 normal, no murmur, rub   or gallop  Breast Exam:    No chest wall tenderness, masses or gynecomastia  Abdomen:     Soft, non-tender,  nondistended, normoactive bowel sounds,    no masses, no hepatosplenomegaly        Extremities:   No clubbing, cyanosis or edema  Pulses:   2+ and symmetric all extremities  Skin:   Skin color, texture, turgor normal, no rashes or lesions  Lymph nodes:   Cervical, supraclavicular, and axillary nodes normal  Neurologic:   CNII-XII intact, normal strength, sensation and gait; reflexes 2+ and symmetric throughout          Psych:   Normal mood, affect, hygiene and grooming.          Assessment & Plan:  Routine general medical examination at a health care facility - Plan: Urinalysis Dipstick  BPH (benign prostatic hyperplasia)  Hyperlipidemia  Allergic rhinitis due to pollen  Family history of heart disease in male family member before age 71 he plans to get a pneumococcal vaccine at the drugstore. Otherwise he will continue on his present medication regimen. Abdominal ultrasound ordered as well as routine blood screening.

## 2013-11-12 NOTE — Telephone Encounter (Signed)
Pt states he spoke to [employer] again today and they changed the PCP to Dr [name]

## 2013-11-13 ENCOUNTER — Telehealth: Payer: Self-pay

## 2013-11-13 LAB — COMPREHENSIVE METABOLIC PANEL
ALBUMIN: 4.2 g/dL (ref 3.5–5.2)
ALK PHOS: 50 U/L (ref 39–117)
ALT: 28 U/L (ref 0–53)
AST: 27 U/L (ref 0–37)
BUN: 22 mg/dL (ref 6–23)
CO2: 24 mEq/L (ref 19–32)
Calcium: 9.4 mg/dL (ref 8.4–10.5)
Chloride: 105 mEq/L (ref 96–112)
Creat: 1 mg/dL (ref 0.50–1.35)
GLUCOSE: 88 mg/dL (ref 70–99)
POTASSIUM: 4.2 meq/L (ref 3.5–5.3)
Sodium: 139 mEq/L (ref 135–145)
Total Bilirubin: 0.9 mg/dL (ref 0.2–1.2)
Total Protein: 6.9 g/dL (ref 6.0–8.3)

## 2013-11-13 LAB — CBC WITH DIFFERENTIAL/PLATELET
BASOS PCT: 0 % (ref 0–1)
Basophils Absolute: 0 10*3/uL (ref 0.0–0.1)
EOS ABS: 0 10*3/uL (ref 0.0–0.7)
Eosinophils Relative: 1 % (ref 0–5)
HEMATOCRIT: 44.3 % (ref 39.0–52.0)
HEMOGLOBIN: 15.7 g/dL (ref 13.0–17.0)
Lymphocytes Relative: 21 % (ref 12–46)
Lymphs Abs: 1 10*3/uL (ref 0.7–4.0)
MCH: 30.9 pg (ref 26.0–34.0)
MCHC: 35.4 g/dL (ref 30.0–36.0)
MCV: 87.2 fL (ref 78.0–100.0)
MONO ABS: 0.4 10*3/uL (ref 0.1–1.0)
MONOS PCT: 9 % (ref 3–12)
NEUTROS ABS: 3.2 10*3/uL (ref 1.7–7.7)
Neutrophils Relative %: 69 % (ref 43–77)
Platelets: 204 10*3/uL (ref 150–400)
RBC: 5.08 MIL/uL (ref 4.22–5.81)
RDW: 13.1 % (ref 11.5–15.5)
WBC: 4.7 10*3/uL (ref 4.0–10.5)

## 2013-11-13 LAB — LIPID PANEL
CHOLESTEROL: 135 mg/dL (ref 0–200)
HDL: 42 mg/dL (ref >39)
LDL CALC: 82 mg/dL (ref 0–99)
TRIGLYCERIDES: 56 mg/dL (ref <150)
Total CHOL/HDL Ratio: 3.2 Ratio
VLDL: 11 mg/dL (ref 0–40)

## 2013-11-13 MED ORDER — ATORVASTATIN CALCIUM 10 MG PO TABS: 10.0000 mg | ORAL_TABLET | Freq: Every day | ORAL | Status: DC

## 2013-11-13 NOTE — Telephone Encounter (Signed)
Pt called stating you want him to have an esphogeal ultrasound done and he states that he dose not want to have this done now. Please advise

## 2013-11-13 NOTE — Telephone Encounter (Signed)
Called pt back and left VM regarding [facility]

## 2013-11-13 NOTE — Addendum Note (Signed)
Addended by: Ronnald NianLALONDE, Claudy Abdallah C on: 11/13/2013 09:40 AM   Modules accepted: Orders

## 2013-11-13 NOTE — Telephone Encounter (Signed)
It's an abdominal ultrasound for evaluation of possible aneurysm. If he doesn't want it does go ahead and cancel

## 2013-11-18 ENCOUNTER — Telehealth: Payer: Self-pay | Admitting: Family Medicine

## 2013-11-18 NOTE — Telephone Encounter (Signed)
[facility] called [facility] [address] [facility] his [facility] vaccine that you wanted him to have.  Please specify prevnar 13 or 23

## 2013-11-23 ENCOUNTER — Ambulatory Visit
Admission: RE | Admit: 2013-11-23 | Discharge: 2013-11-23 | Disposition: A | Discharge status: Home / Self Care | Payer: Managed Care, Other (non HMO) | Source: Ambulatory Visit | Attending: Family Medicine | Admitting: Family Medicine

## 2013-11-23 ENCOUNTER — Other Ambulatory Visit: Payer: Self-pay | Admitting: Family Medicine

## 2013-11-23 DIAGNOSIS — Z0000 Encounter for general adult medical examination without abnormal findings: Secondary | ICD-10-CM

## 2013-11-23 NOTE — Telephone Encounter (Signed)
I HAVE SENT FAX FOR PNEUMO 13

## 2013-11-23 NOTE — Telephone Encounter (Signed)
Take care of this. Put the correct Pneumovax shot on the prescription

## 2013-12-02 ENCOUNTER — Telehealth: Payer: Self-pay | Admitting: Internal Medicine

## 2013-12-02 NOTE — Telephone Encounter (Signed)
Pt states he went to his pharmacy to get the pneumonia shot and for prevnar 13 it is over 205.00 due to his insurance. What do you want him to do?

## 2013-12-02 NOTE — Telephone Encounter (Signed)
Pt informed to wait he verbalized understanding

## 2013-12-02 NOTE — Telephone Encounter (Signed)
I would have him wait on that

## 2013-12-03 ENCOUNTER — Other Ambulatory Visit (INDEPENDENT_AMBULATORY_CARE_PROVIDER_SITE_OTHER): Payer: Medicare HMO

## 2013-12-03 DIAGNOSIS — Z23 Encounter for immunization: Secondary | ICD-10-CM

## 2014-01-01 ENCOUNTER — Ambulatory Visit (INDEPENDENT_AMBULATORY_CARE_PROVIDER_SITE_OTHER): Payer: Medicare HMO | Admitting: Medical

## 2014-01-01 ENCOUNTER — Encounter: Payer: Self-pay | Admitting: Medical

## 2014-01-01 VITALS — BP 100/70 | HR 56 | Temp 97.6°F | Resp 14 | Wt 172.0 lb

## 2014-01-01 DIAGNOSIS — R5381 Other malaise: Secondary | ICD-10-CM

## 2014-01-01 DIAGNOSIS — R5383 Other fatigue: Secondary | ICD-10-CM

## 2014-01-01 DIAGNOSIS — R52 Pain, unspecified: Secondary | ICD-10-CM

## 2014-01-01 DIAGNOSIS — R6883 Chills (without fever): Secondary | ICD-10-CM

## 2014-01-01 NOTE — Progress Notes (Signed)
Subjective: Here for feeling like crap.  3 days ago started with light headache, sweaty, clammy, occasional nausea, 99 temp.  Slept whole day Wednesday.   Last night purposely trying to rest, feels a little better today, but not right feeling.  Had a stomach ache but that resolved.  No runny nose, no congestion.  2/10 headache.  No sore throat, no ear pain.   Having some mild body aches.   No GU symptoms, no diarrhea, no constipation, no blood in urine or stool.  No rash.   No chest pain.  No edema, no SOB.   Works in [employer], but no obvious sick contacts.   otherwise in normal state of health.  No other aggravating or relieving factors. No other complaint.  ROS    Objective: Filed Vitals:   01/01/14 1058  BP: 100/70  Pulse: 56  Temp: 97.6 F (36.4 C)  Resp: 14   Wt Readings from Last 3 Encounters:  01/01/14 172 lb (78.019 kg)  11/12/13 169 lb (76.658 kg)  06/22/13 173 lb (78.472 kg)    General appearance: alert, no distress, WD/WN HEENT: normocephalic, sclerae anicteric, TMs pearly, nares patent, no discharge or erythema, pharynx normal Oral cavity: MMM, no lesions Neck: supple, no lymphadenopathy, no thyromegaly, no masses, no JVD Heart: RRR, normal S1, S2, no murmurs Lungs: CTA bilaterally, no wheezes, rhonchi, or rales Abdomen: +bs, soft, non tender, non distended, no masses, no hepatomegaly, no splenomegaly Pulses: 2+ symmetric, upper and lower extremities, normal cap refill Extremities no edema Neuro nonfocal exam  Assessment: Encounter Diagnoses  Name Primary?  . Other malaise and fatigue Yes  . Body aches   . Chills     Plan: We discussed his concerns symptoms and exam.  The only finding of note today is a little bradycardia compared to normal. His pulse was 60 when I checked.  I suspect he has a viral syndrome.  He declines any other testing at this time.  I reviewed recent physical notes and labs prior EKG.  At this point advised rest, ibuprofen for chills and aches,  good water intake, can consider over-the-counter Mucinex DM if cough/congestion. He knows how to check his pulse and advise if he sees pulse rate under 50, then certainly let us know.  If any new worsening symptoms particularly short of breath, chest pain, edema, presyncope, then call 911.  Otherwise call or return if worse or not improving.

## 2014-01-07 ENCOUNTER — Ambulatory Visit: Payer: Medicare HMO | Admitting: Family Medicine

## 2014-02-15 ENCOUNTER — Other Ambulatory Visit (INDEPENDENT_AMBULATORY_CARE_PROVIDER_SITE_OTHER): Payer: Medicare HMO

## 2014-02-15 ENCOUNTER — Telehealth: Payer: Self-pay | Admitting: Family Medicine

## 2014-02-15 ENCOUNTER — Other Ambulatory Visit: Payer: Self-pay

## 2014-02-15 DIAGNOSIS — Z23 Encounter for immunization: Secondary | ICD-10-CM

## 2014-02-15 MED ORDER — ALPRAZOLAM 0.25 MG PO TABS: 0.2500 mg | ORAL_TABLET | Freq: Two times a day (BID) | ORAL | Status: DC | PRN

## 2014-02-15 NOTE — Telephone Encounter (Signed)
Okay to call in. 

## 2014-02-15 NOTE — Telephone Encounter (Signed)
done

## 2014-02-15 NOTE — Telephone Encounter (Signed)
Pt came in for flu shot and requested a refill on xanax. He states he uses it rarely but is almost out. He has two left. Would [facility] [address]

## 2014-02-15 NOTE — Telephone Encounter (Signed)
Called in xanax per jcl 

## 2014-07-08 ENCOUNTER — Encounter: Payer: Self-pay | Admitting: Family Medicine

## 2014-07-08 ENCOUNTER — Ambulatory Visit (INDEPENDENT_AMBULATORY_CARE_PROVIDER_SITE_OTHER): Payer: Medicare HMO | Admitting: Family Medicine

## 2014-07-08 VITALS — BP 118/72 | HR 60 | Temp 97.2°F | Ht 67.0 in | Wt 174.0 lb

## 2014-07-08 DIAGNOSIS — J3489 Other specified disorders of nose and nasal sinuses: Secondary | ICD-10-CM

## 2014-07-08 NOTE — Progress Notes (Signed)
Chief Complaint  Patient presents with  . Facial Pain    x 1.5-2 months ago. Pain is facial and aorund his forehead and around the back to his neck. Not porducing any mucus, no coughing. Occasional sudafed does help.    He has been having left sided facial pain x 3-5 weeks.  Last dental exam was 3-4 months ago, he had a crown and some dental work.  It was sore then, but resolved; then 4-5 weeks ago he started having recurrent pain at the left upper jaw.  Pain then moved to the posterior neck.  A couple of weeks ago he noticed the pain extending throughout the left side of the face, even above the eye.  Most of the discomfort is in the left cheek and left jaw.  Pain comes and goes.  He has taken an occasional sudafed with some improvement at times.  He has also taken tylenol, with temporary pain relief. He doesn't take anti-inflammatories, after having problems with bleeding after taking aleve once years ago.  He is not having runny nose or cold/sinus symptoms. He has nasal congestion.  He blows his nose but only gets out clear mucus. No discolored mucus.  Denies sore throat or postnasal drainage.  No cough, ear pain.  PMH, PSH, and SH reviewed  Outpatient Encounter Prescriptions as of 07/08/2014  Medication Sig Note  . aspirin 81 MG tablet Take 81 mg by mouth daily.     Marland Kitchen. atorvastatin (LIPITOR) 10 MG tablet Take 1 tablet (10 mg total) by mouth daily.   . chlorhexidine (PERIDEX) 0.12 % solution Use as directed 15 mLs in the mouth or throat 4 (four) times daily.  07/08/2014: Uses daily  . Cyanocobalamin (VITAMIN B-12) 2500 MCG SUBL Place under the tongue.   . fish oil-omega-3 fatty acids 1000 MG capsule Take 1 g by mouth daily.    . Multiple Vitamins-Minerals (MULTIVITAMIN WITH MINERALS) tablet Take 1 tablet by mouth daily.    Marland Kitchen. albuterol (PROVENTIL HFA;VENTOLIN HFA) 108 (90 BASE) MCG/ACT inhaler Inhale 2 puffs into the lungs every 6 (six) hours as needed for wheezing. (Patient not taking: Reported  on 07/08/2014)   . ALPRAZolam (XANAX) 0.25 MG tablet Take 1 tablet (0.25 mg total) by mouth 2 (two) times daily as needed for anxiety. (Patient not taking: Reported on 07/08/2014) 07/08/2014: Not needing  . [DISCONTINUED] TURMERIC CURCUMIN PO Take by mouth.    No Known Allergies  ROS:  No headaches (other than as described).  Denies fevers, chills, ear pain, hearing loss, sore throat, cough, chest pain, shortness of breath, nausea, vomiting, bowel changes, bleeding, bruising, rash or other problems. See HPI.  PHYSICAL EXAM: BP 118/72 mmHg  Pulse 60  Temp(Src) 97.2 F (36.2 C) (Tympanic)  Ht 5\' 7"  (1.702 m)  Wt 174 lb (78.926 kg)  BMI 27.25 kg/m2  Well developed, pleasant male in no distress HEENT: PERRL, EOMI, conjunctiva clear. Nasal mucosa is moderately edematous, severe on the left.  No erythema or purulence, mucus is clear. Sinuses nontender TM's and EAC's normal. OP: mild cobblestoning posteriorly.  Upper teeth on left are normal--no soft tissue swelling, erythema or tenderness TMJ nontender, no clicking Neck: No lymphadenopathy or mass nontender over mastoid  ASSESSMENT/PLAN:  Sinus pain - left maxillary  Likely related to underlying allergies.  Cannot r/o musculoskeletal component also.  Discussed proper use of flonase Had bleeding from aleve, so he prefers to use tylenol prn pain  I feel that your pain is likely contributed by sinus  congestion.  Recommendations include Flonase nasal spray. If you prefer to try pills first, use an antihistamine such as claritin or zyrtec, once daily every day, and use sudafed along with it if needed. Remember the proper way to use flonase--gentle sniffs and avoid aiming at the septum, aim slightly outwards.  You may have a muscular component to your discomfort.  You can try heat. Avoid anti-inflammatories given your history of bleeding.  Consider trying ibuprofen at low dose

## 2014-07-08 NOTE — Patient Instructions (Signed)
  I feel that your pain is likely contributed by sinus congestion.  Recommendations include Flonase nasal spray. If you prefer to try pills first, use an antihistamine such as claritin or zyrtec, once daily every day, and use sudafed along with it if needed. Remember the proper way to use flonase--gentle sniffs and avoid aiming at the septum, aim slightly outwards.  You may have a muscular component to your discomfort.  You can try heat. Avoid anti-inflammatories given your history of bleeding.  Consider trying ibuprofen at low dose

## 2014-11-14 ENCOUNTER — Other Ambulatory Visit: Payer: Self-pay | Admitting: Family Medicine

## 2014-12-01 ENCOUNTER — Encounter: Payer: Self-pay | Admitting: Family Medicine

## 2014-12-01 ENCOUNTER — Ambulatory Visit (INDEPENDENT_AMBULATORY_CARE_PROVIDER_SITE_OTHER): Payer: Medicare HMO | Admitting: Family Medicine

## 2014-12-01 VITALS — BP 120/76 | HR 80 | Ht 67.0 in | Wt 168.0 lb

## 2014-12-01 DIAGNOSIS — J301 Allergic rhinitis due to pollen: Secondary | ICD-10-CM | POA: Diagnosis not present

## 2014-12-01 DIAGNOSIS — L309 Dermatitis, unspecified: Secondary | ICD-10-CM | POA: Diagnosis not present

## 2014-12-01 DIAGNOSIS — E785 Hyperlipidemia, unspecified: Secondary | ICD-10-CM

## 2014-12-01 DIAGNOSIS — Z0000 Encounter for general adult medical examination without abnormal findings: Secondary | ICD-10-CM

## 2014-12-01 DIAGNOSIS — N400 Enlarged prostate without lower urinary tract symptoms: Secondary | ICD-10-CM

## 2014-12-01 DIAGNOSIS — Z8249 Family history of ischemic heart disease and other diseases of the circulatory system: Secondary | ICD-10-CM | POA: Diagnosis not present

## 2014-12-01 LAB — POCT URINALYSIS DIPSTICK
BILIRUBIN UA: NEGATIVE
Blood, UA: NEGATIVE
GLUCOSE UA: NEGATIVE
KETONES UA: NEGATIVE
Leukocytes, UA: NEGATIVE
Nitrite, UA: NEGATIVE
Protein, UA: NEGATIVE
Spec Grav, UA: 1.02
Urobilinogen, UA: NEGATIVE
pH, UA: 6

## 2014-12-01 LAB — CBC WITH DIFFERENTIAL/PLATELET
BASOS ABS: 0 10*3/uL (ref 0.0–0.1)
Basophils Relative: 0 % (ref 0–1)
EOS ABS: 0.1 10*3/uL (ref 0.0–0.7)
EOS PCT: 1 % (ref 0–5)
HEMATOCRIT: 43.1 % (ref 39.0–52.0)
Hemoglobin: 15.7 g/dL (ref 13.0–17.0)
LYMPHS ABS: 1 10*3/uL (ref 0.7–4.0)
Lymphocytes Relative: 19 % (ref 12–46)
MCH: 31.8 pg (ref 26.0–34.0)
MCHC: 36.4 g/dL — AB (ref 30.0–36.0)
MCV: 87.4 fL (ref 78.0–100.0)
MPV: 9.5 fL (ref 8.6–12.4)
Monocytes Absolute: 0.5 10*3/uL (ref 0.1–1.0)
Monocytes Relative: 10 % (ref 3–12)
Neutro Abs: 3.7 10*3/uL (ref 1.7–7.7)
Neutrophils Relative %: 70 % (ref 43–77)
Platelets: 181 10*3/uL (ref 150–400)
RBC: 4.93 MIL/uL (ref 4.22–5.81)
RDW: 12.8 % (ref 11.5–15.5)
WBC: 5.3 10*3/uL (ref 4.0–10.5)

## 2014-12-01 LAB — COMPREHENSIVE METABOLIC PANEL
ALK PHOS: 48 U/L (ref 39–117)
ALT: 30 U/L (ref 0–53)
AST: 24 U/L (ref 0–37)
Albumin: 4 g/dL (ref 3.5–5.2)
BILIRUBIN TOTAL: 0.9 mg/dL (ref 0.2–1.2)
BUN: 20 mg/dL (ref 6–23)
CO2: 24 meq/L (ref 19–32)
CREATININE: 0.81 mg/dL (ref 0.50–1.35)
Calcium: 9.2 mg/dL (ref 8.4–10.5)
Chloride: 105 mEq/L (ref 96–112)
Glucose, Bld: 80 mg/dL (ref 70–99)
Potassium: 3.9 mEq/L (ref 3.5–5.3)
SODIUM: 141 meq/L (ref 135–145)
TOTAL PROTEIN: 6.7 g/dL (ref 6.0–8.3)

## 2014-12-01 LAB — LIPID PANEL
Cholesterol: 149 mg/dL (ref 0–200)
HDL: 46 mg/dL (ref >=40)
LDL CALC: 90 mg/dL (ref 0–99)
Total CHOL/HDL Ratio: 3.2 Ratio
Triglycerides: 66 mg/dL (ref <150)
VLDL: 13 mg/dL (ref 0–40)

## 2014-12-01 MED ORDER — ATORVASTATIN CALCIUM 10 MG PO TABS: 10.0000 mg | ORAL_TABLET | Freq: Every day | ORAL | Status: DC

## 2014-12-01 NOTE — Progress Notes (Signed)
Subjective:    Patient ID: Logan Banks, male    DOB: [birth year], 74 y.o.   MRN: [id number]  HPI He is here for complete examination. He does have some issues with some redness on his face but states that it has occurred since he starting getting facials. He does have a family history of heart disease and in the past has seen his cardiologist.He has had no chest pain, shortness of breath or PND.His allergies are under good control. no sneezing, itchy watery eyes.He continues on Lipitor and has no symptoms of aches and pains or malaise fatigue. He does have a history of BPH but does not complain of any urinary symptoms. He is getting ready to retire. He does plan to keep himself quite active. He is active socially dating women and men. Logan Banks he could have social history as well as health maintenance and immunizations were reviewed.   Review of Systems  All other systems reviewed and are negative.      Objective:   Physical Exam BP 120/76 mmHg  Pulse 80  Ht  (1.702 m)  Wt 168 lb (76.204 kg)  BMI 26.31 kg/m2  SpO2 97%  General Appearance:    Alert, cooperative, no distress, appears stated age  Head:    Normocephalic, without obvious abnormality, atraumatic.Exam of the face does show areas of erythema and slight induration.  Eyes:    PERRL, conjunctiva/corneas clear, EOM's intact, fundi    benign  Ears:    Normal TM's and external ear canals  Nose:   Nares normal, mucosa normal, no drainage or sinus   tenderness  Throat:   Lips, mucosa, and tongue normal; teeth and gums normal  Neck:   Supple, no lymphadenopathy;  thyroid:  no   enlargement/tenderness/nodules; no carotid   bruit or JVD  Back:    Spine nontender, no curvature, ROM normal, no CVA     tenderness  Lungs:     Clear to auscultation bilaterally without wheezes, rales or     ronchi; respirations unlabored  Chest Wall:    No tenderness or deformity   Heart:    Regular rate and rhythm, S1 and S2 normal, no murmur,  rub   or gallop  Breast Exam:    No chest wall tenderness, masses or gynecomastia  Abdomen:     Soft, non-tender, nondistended, normoactive bowel sounds,    no masses, no hepatosplenomegaly        Extremities:   No clubbing, cyanosis or edema  Pulses:   2+ and symmetric all extremities  Skin:   Skin color, texture, turgor normal, no rashes or lesions  Lymph nodes:   Cervical, supraclavicular, and axillary nodes normal  Neurologic:   CNII-XII intact, normal strength, sensation and gait; reflexes 2+ and symmetric throughout          Psych:   Normal mood, affect, hygiene and grooming.          Assessment & Plan:  Routine general medical examination at a health care facility - Plan: POCT Urinalysis Dipstick, CBC with Differential/Platelet, Comprehensive metabolic panel, Lipid panel  Family history of heart disease in male family member before age 85 - Plan: POCT Urinalysis Dipstick, CBC with Differential/Platelet, Comprehensive metabolic panel  Allergic rhinitis due to pollen  Hyperlipidemia - Plan: Lipid panel, atorvastatin (LIPITOR) 10 MG tablet  BPH (benign prostatic hyperplasia)  Dermatitis  I encouraged him to continue with his very active lifestyle. He will set up an appointment to see the dermatologist.  He will continue on his present medications. Discussed retirement with him in regard to keeping his mind and body busy.

## 2015-01-26 ENCOUNTER — Telehealth: Payer: Self-pay | Admitting: Family Medicine

## 2015-01-26 ENCOUNTER — Other Ambulatory Visit (INDEPENDENT_AMBULATORY_CARE_PROVIDER_SITE_OTHER): Payer: Medicare HMO

## 2015-01-26 DIAGNOSIS — Z23 Encounter for immunization: Secondary | ICD-10-CM | POA: Diagnosis not present

## 2015-01-26 NOTE — Telephone Encounter (Signed)
Pt coming in for flu shot this afternoon and wants to know if he can get Pneumonia shot as well.

## 2015-01-26 NOTE — Telephone Encounter (Signed)
Record indicates here and he has had his 2 pneumonia shots

## 2015-01-26 NOTE — Telephone Encounter (Signed)
Left message that he didn't need the pneumo.

## 2015-02-23 DIAGNOSIS — H2512 Age-related nuclear cataract, left eye: Secondary | ICD-10-CM | POA: Diagnosis not present

## 2015-03-10 DIAGNOSIS — Z0100 Encounter for examination of eyes and vision without abnormal findings: Secondary | ICD-10-CM | POA: Diagnosis not present

## 2015-03-29 ENCOUNTER — Ambulatory Visit (INDEPENDENT_AMBULATORY_CARE_PROVIDER_SITE_OTHER): Payer: Medicare HMO | Admitting: Family Medicine

## 2015-03-29 DIAGNOSIS — N529 Male erectile dysfunction, unspecified: Secondary | ICD-10-CM

## 2015-03-29 MED ORDER — TADALAFIL 20 MG PO TABS: 20.0000 mg | ORAL_TABLET | Freq: Every day | ORAL | Status: DC | PRN

## 2015-03-29 NOTE — Progress Notes (Signed)
   Subjective:    Patient ID: Logan Banks, male    DOB: 07/26/1940, 74 y.o.   MRN: [id number]  HPI He has noted slowly increasing difficulty with getting and maintaining erections. He is on no new medications, does not smoke. He is dating several women and things are going well there. He would like to try Cialis.  Review of Systems     Objective:   Physical Exam Alert and in no distress otherwise not examined       Assessment & Plan:  Erectile dysfunction, unspecified erectile dysfunction type - Plan: tadalafil (CIALIS) 20 MG tablet  prescription written which she will try to get [place] in [place]. Samples also given.

## 2015-05-23 DIAGNOSIS — M7521 Bicipital tendinitis, right shoulder: Secondary | ICD-10-CM | POA: Diagnosis not present

## 2015-05-26 DIAGNOSIS — M7521 Bicipital tendinitis, right shoulder: Secondary | ICD-10-CM | POA: Diagnosis not present

## 2015-05-30 DIAGNOSIS — M7521 Bicipital tendinitis, right shoulder: Secondary | ICD-10-CM | POA: Diagnosis not present

## 2015-06-02 DIAGNOSIS — Z961 Presence of intraocular lens: Secondary | ICD-10-CM | POA: Diagnosis not present

## 2015-06-08 DIAGNOSIS — M7521 Bicipital tendinitis, right shoulder: Secondary | ICD-10-CM | POA: Diagnosis not present

## 2015-06-13 DIAGNOSIS — M7521 Bicipital tendinitis, right shoulder: Secondary | ICD-10-CM | POA: Diagnosis not present

## 2015-06-20 DIAGNOSIS — M7521 Bicipital tendinitis, right shoulder: Secondary | ICD-10-CM | POA: Diagnosis not present

## 2015-06-29 DIAGNOSIS — M7521 Bicipital tendinitis, right shoulder: Secondary | ICD-10-CM | POA: Diagnosis not present

## 2015-09-06 ENCOUNTER — Ambulatory Visit (INDEPENDENT_AMBULATORY_CARE_PROVIDER_SITE_OTHER): Payer: Medicare HMO | Admitting: Family Medicine

## 2015-09-06 ENCOUNTER — Encounter: Payer: Self-pay | Admitting: Family Medicine

## 2015-09-06 VITALS — BP 122/76 | HR 65 | Wt 175.6 lb

## 2015-09-06 DIAGNOSIS — M79652 Pain in left thigh: Secondary | ICD-10-CM

## 2015-09-06 NOTE — Progress Notes (Signed)
   Subjective:    Patient ID: Logan Banks, male    DOB: 07/22/1940, 75 y.o.   MRN: [id number]  HPI He complains of a 3 month history of left thigh aching that initially is worse when he gets from a sitting to standing position however with further walking, the thigh discomfort does go away. He also notes when he squeezes on his 5 the pain will go away. He's had no numbness, tingling or weakness. No other bone or joint symptoms. Also states that when he gets out of the chair he does feel some slight back discomfort. He is exercising twice per week and apparently not having any difficulty with that.   Review of Systems     Objective:   Physical Exam Alert and in no distress. normal lumbar curve and motion.negative straight leg raising. DTRs were diminished. Normal hip motion. No palpable tenderness.       Assessment & Plan:  Left thigh pain reassured him that I did not find anything significant. Recommend good stretching exercises and returning here if symptoms change.

## 2015-09-27 ENCOUNTER — Encounter: Payer: Self-pay | Admitting: Family Medicine

## 2015-09-27 ENCOUNTER — Ambulatory Visit (INDEPENDENT_AMBULATORY_CARE_PROVIDER_SITE_OTHER): Payer: Medicare HMO | Admitting: Family Medicine

## 2015-09-27 VITALS — BP 100/70 | HR 72 | Temp 98.7°F | Wt 174.0 lb

## 2015-09-27 DIAGNOSIS — J029 Acute pharyngitis, unspecified: Secondary | ICD-10-CM

## 2015-09-27 MED ORDER — AZITHROMYCIN 500 MG PO TABS: 500.0000 mg | ORAL_TABLET | Freq: Every day | ORAL | Status: DC

## 2015-09-27 NOTE — Progress Notes (Signed)
Subjective:     Patient ID: Logan Banks, male   DOB: 03/13/1941, 75 y.o.   MRN: [id number]  HPI Mr. Logan Banks presents to clinic with a 3 wk history of dry cough and PND and 2 day history of sore throat and right neck pain.  He denies fever, nasal congestion, facial pain, SOB.  He has tried saline gargles, cough syrup, and other OTC meds to little effect.  He has no history of seasonal allergies or underlying lung conditions.    Review of Systems     Objective:   Physical Exam Alert and in no distress.  Tympanic membranes and canals are normal.  Some pharyngeal erythema. Neck is supple without adenopathy or thyromegaly.  Cardiac exam shows a regular sinus rhythm without murmurs or gallops.  Lungs are clear to auscultation.     Assessment/Plan:     Acute pharyngitis, unspecified etiology - Plan: azithromycin (ZITHROMAX) 500 MG tablet Given his 3 week history of symptoms, will treat with a 3 day course of azithromycin.  Will follow up if symptoms do not improve after abx course.

## 2015-12-05 ENCOUNTER — Other Ambulatory Visit: Payer: Self-pay

## 2015-12-05 ENCOUNTER — Telehealth: Payer: Self-pay

## 2015-12-05 DIAGNOSIS — E785 Hyperlipidemia, unspecified: Secondary | ICD-10-CM

## 2015-12-05 MED ORDER — ATORVASTATIN CALCIUM 10 MG PO TABS: 10.0000 mg | ORAL_TABLET | Freq: Every day | ORAL | Status: DC

## 2015-12-05 NOTE — Telephone Encounter (Signed)
Sent med in 

## 2015-12-05 NOTE — Telephone Encounter (Signed)
Fax rcvd for refill request of Atorvastatin 10mg   [facility]

## 2015-12-07 ENCOUNTER — Telehealth: Payer: Self-pay | Admitting: Family Medicine

## 2015-12-07 NOTE — Telephone Encounter (Signed)
Renewed 12/05/2015. Trixie Rude/RLB

## 2015-12-07 NOTE — Telephone Encounter (Signed)
[facility]  Atorvastatin 10 mg  #90 [facility]/23/15  [facility] [address]

## 2015-12-13 ENCOUNTER — Ambulatory Visit (INDEPENDENT_AMBULATORY_CARE_PROVIDER_SITE_OTHER): Payer: Medicare HMO | Admitting: Family Medicine

## 2015-12-13 ENCOUNTER — Encounter: Payer: Self-pay | Admitting: Family Medicine

## 2015-12-13 VITALS — BP 116/70 | HR 70 | Ht 67.0 in | Wt 174.0 lb

## 2015-12-13 DIAGNOSIS — E785 Hyperlipidemia, unspecified: Secondary | ICD-10-CM | POA: Diagnosis not present

## 2015-12-13 DIAGNOSIS — N400 Enlarged prostate without lower urinary tract symptoms: Secondary | ICD-10-CM

## 2015-12-13 DIAGNOSIS — Z8249 Family history of ischemic heart disease and other diseases of the circulatory system: Secondary | ICD-10-CM | POA: Diagnosis not present

## 2015-12-13 DIAGNOSIS — J301 Allergic rhinitis due to pollen: Secondary | ICD-10-CM

## 2015-12-13 DIAGNOSIS — N529 Male erectile dysfunction, unspecified: Secondary | ICD-10-CM | POA: Diagnosis not present

## 2015-12-13 LAB — CBC WITH DIFFERENTIAL/PLATELET
BASOS ABS: 0 {cells}/uL (ref 0–200)
Basophils Relative: 0 %
EOS PCT: 1 %
Eosinophils Absolute: 52 cells/uL (ref 15–500)
HEMATOCRIT: 43.7 % (ref 38.5–50.0)
HEMOGLOBIN: 15.2 g/dL (ref 13.2–17.1)
LYMPHS ABS: 884 {cells}/uL (ref 850–3900)
Lymphocytes Relative: 17 %
MCH: 31.4 pg (ref 27.0–33.0)
MCHC: 34.8 g/dL (ref 32.0–36.0)
MCV: 90.3 fL (ref 80.0–100.0)
MONO ABS: 468 {cells}/uL (ref 200–950)
MPV: 10.5 fL (ref 7.5–12.5)
Monocytes Relative: 9 %
NEUTROS ABS: 3796 {cells}/uL (ref 1500–7800)
NEUTROS PCT: 73 %
Platelets: 184 10*3/uL (ref 140–400)
RBC: 4.84 MIL/uL (ref 4.20–5.80)
RDW: 12.8 % (ref 11.0–15.0)
WBC: 5.2 10*3/uL (ref 4.0–10.5)

## 2015-12-13 MED ORDER — TADALAFIL 20 MG PO TABS
20.0000 mg | ORAL_TABLET | Freq: Every day | ORAL | 5 refills | Status: DC | PRN
Start: 2015-12-13 — End: 2017-11-18

## 2015-12-13 NOTE — Progress Notes (Signed)
Subjective:   HPI  Logan Banks is a 75 y.o. male who presents for a complete physical.  Medical care team includes:  [facility]  Ode[facility].   Preventative care: Last ophthalmology visit: 2/17 Last dental visit:6/17 Last colonoscopy: 05/28/11 Last prostate exam: 2013 Last EKG:06/24/11 Last labs:12/01/14  Prior vaccinations: TD or Tdap:08/30/10 Influenza:01/26/15 Pneumococcal: 23:12/15/12 13:12/03/13 Shingles/Zostavax:12/07/13 Other: -  Advanced directive:Complete Concerns: He does have a history of hyperlipidemia as well as family history of heart disease. He continues on his atorvastatin and is having no difficulty with that. He also has underlying ED and like a refill on his Cialis. He has a history of BPH but is having no urinary symptoms. Has remote history of smoking and has had abdominal ultrasound for AAA. There is no evidence of cognitive decline or depression. In fact his life is going quite well for him. His allergies are under good control. He has a trip to [place] planned in the near future. Reviewed their medical, surgical, family, social, medication, and allergy history and updated chart as appropriate.  Review of Systems Constitutional: -fever, -chills, -sweats, -unexpected weight change, -decreased appetite, -fatigue Allergy: -sneezing, -itching, -congestion Dermatology: -changing moles, --rash, -lumps ENT: -runny nose, -ear pain, -sore throat, -hoarseness, -sinus pain, -teeth pain, - ringing in ears, -hearing loss, -nosebleeds Cardiology: -chest pain, -palpitations, -swelling, -difficulty breathing when lying flat, -waking up short of breath Respiratory: -cough, -shortness of breath, -difficulty breathing with exercise or exertion, -wheezing, -coughing up blood Gastroenterology: -abdominal pain, -nausea, -vomiting, -diarrhea, -constipation, -blood in stool, -changes in bowel movement, -difficulty swallowing or eating Hematology: -bleeding, -bruising  Musculoskeletal:  -joint aches, -muscle aches, -joint swelling, -back pain, -neck pain, -cramping, -changes in gait Ophthalmology: denies vision changes, eye redness, itching, discharge Urology: -burning with urination, -difficulty urinating, -blood in urine, -urinary frequency, -urgency, -incontinence Neurology: -headache, -weakness, -tingling, -numbness, -memory loss, -falls, -dizziness Psychology: -depressed mood, -agitation, -sleep problems     Objective:   Physical Exam  General appearance: alert, no distress, WD/WN,  Skin:wnlHEENT: normocephalic, conjunctiva/corneas normal, sclerae anicteric, PERRLA, EOMi, nares patent, no discharge or erythema, pharynx normal Oral cavity: MMM, tongue normal, teeth normal Neck: supple, no lymphadenopathy, no thyromegaly, no masses, normal ROM Chest: non tender, normal shape and expansion Heart: RRR, normal S1, S2, no murmurs Lungs: CTA bilaterally, no wheezes, rhonchi, or rales Abdomen: +bs, soft, non tender, non distended, no masses, no hepatomegaly, no splenomegaly, no bruits Back: non tender, normal ROM, no scoliosis Musculoskeletal: upper extremities non tender, no obvious deformity, normal ROM throughout, lower extremities non tender, no obvious deformity, normal ROM throughout Extremities: no edema, no cyanosis, no clubbing Pulses: 2+ symmetric, upper and lower extremities, normal cap refill Neurological: alert, oriented x 3, CN2-12 intact, strength normal upper extremities and lower extremities, sensation normal throughout, DTRs 2+ throughout, no cerebellar signs, gait normal Psychiatric: normal affect, behavior normal, pleasant      Assessment and Plan :   Hyperlipidemia - Plan: Lipid panel  BPH (benign prostatic hyperplasia)  Erectile dysfunction, unspecified erectile dysfunction type - Plan: CBC with Differential/Platelet, Comprehensive metabolic panel, Lipid panel  Family history of heart disease in male family member before age 39 - Plan: CBC with  Differential/Platelet, Comprehensive metabolic panel, Lipid panel  Seasonal allergic rhinitis due to pollen  Physical exam - discussed healthy lifestyle, diet, exercise, preventative care, vaccinations, and addressed their concerns.   I essentially encouraged him to continue to take good care of himself.  Follow-up next fall for flu shot.

## 2015-12-14 LAB — LIPID PANEL
CHOLESTEROL: 138 mg/dL (ref 125–200)
HDL: 52 mg/dL (ref >=40)
LDL Cholesterol: 72 mg/dL (ref <130)
TRIGLYCERIDES: 69 mg/dL (ref <150)
Total CHOL/HDL Ratio: 2.7 Ratio (ref <=5.0)
VLDL: 14 mg/dL (ref <30)

## 2015-12-14 LAB — COMPREHENSIVE METABOLIC PANEL
ALBUMIN: 4.2 g/dL (ref 3.6–5.1)
ALT: 21 U/L (ref 9–46)
AST: 21 U/L (ref 10–35)
Alkaline Phosphatase: 49 U/L (ref 40–115)
BILIRUBIN TOTAL: 0.9 mg/dL (ref 0.2–1.2)
BUN: 21 mg/dL (ref 7–25)
CALCIUM: 9.2 mg/dL (ref 8.6–10.3)
CO2: 26 mmol/L (ref 20–31)
CREATININE: 1.01 mg/dL (ref 0.70–1.18)
Chloride: 106 mmol/L (ref 98–110)
Glucose, Bld: 91 mg/dL (ref 65–99)
Potassium: 4.5 mmol/L (ref 3.5–5.3)
SODIUM: 140 mmol/L (ref 135–146)
TOTAL PROTEIN: 6.9 g/dL (ref 6.1–8.1)

## 2015-12-21 ENCOUNTER — Ambulatory Visit (INDEPENDENT_AMBULATORY_CARE_PROVIDER_SITE_OTHER): Payer: Medicare HMO | Admitting: Family Medicine

## 2015-12-21 ENCOUNTER — Encounter: Payer: Self-pay | Admitting: Family Medicine

## 2015-12-21 VITALS — BP 130/70 | HR 68 | Temp 98.5°F | Resp 18 | Ht 67.0 in | Wt 176.4 lb

## 2015-12-21 DIAGNOSIS — R21 Rash and other nonspecific skin eruption: Secondary | ICD-10-CM | POA: Diagnosis not present

## 2015-12-21 NOTE — Patient Instructions (Signed)
  Take claritin or allegra or zyrtec or benadryl (but this should be taken at night only, since it causes sedation) to help with the itching. It is okay to continue the benadryl at night and take one of the others during the day. Try and avoid prolonged hot showers, and continue to moisturize your skin well. Continue the hydrocortisone cream up to three times daily, if needed for itching.  Contact us if the rash changes--develop hives, raised bumps, blisters, if you develop any weeping, or open sores, fevers, other rashes (ie bulls-eye). I suspect this is an allergic reaction to something--not sure that we can pinpoint exactly what the exposure was.

## 2015-12-21 NOTE — Progress Notes (Signed)
Chief Complaint  Patient presents with  . Rash    on left side and upper back.     He first noticed a rash about a week ago--it was on the left side of his torso (abdomen and back).  It is itchy, and hasn't seemed to spread or get worse over the last week.  He does mention that he changed laundry detergent 2 weeks ago, but would have expected rash elsewhere on his body. Washes clothing before wearing, no new foods, shampoo or other products.  He has been outside in the yard a lot. No fever, chills, myalgias, arthralgias, malaise, fatigue, tick bites, sore throat.  He has used hydrocortisone cream which helps with the itching.  He has 2.5% rx from Dr. Terri Piedra, but has only been using the OTC kind (the rx is a small tube, expensive).  He has been taking benadryl at night, which helps with the itching and he has been sleeping well.  He is going to [place] soon (mission trip with [employer]), and he is worried about getting it cleared up before the trip. Asking about if he needs to be concerned about anything in particular with traveling there  PMH, PSH, SH reviewed  Outpatient Encounter Prescriptions as of 12/21/2015  Medication Sig  . ALPRAZolam (XANAX) 0.25 MG tablet Take 0.25 mg by mouth at bedtime as needed for anxiety (for flying).  Marland Kitchen aspirin 81 MG tablet Take 81 mg by mouth daily.    Marland Kitchen atorvastatin (LIPITOR) 10 MG tablet Take 1 tablet (10 mg total) by mouth daily.  . Cyanocobalamin (VITAMIN B-12) 2500 MCG SUBL Place under the tongue.  . fish oil-omega-3 fatty acids 1000 MG capsule Take 1 g by mouth daily.   . Multiple Vitamins-Minerals (MULTIVITAMIN WITH MINERALS) tablet Take 1 tablet by mouth daily.   . tadalafil (CIALIS) 20 MG tablet Take 1 tablet (20 mg total) by mouth daily as needed for erectile dysfunction.   No facility-administered encounter medications on file as of 12/21/2015.    No Known Allergies  ROS: no fever, chills, URI symptoms, headaches, dizziness, GI or GU complaints, or  other concerns. See HPI.  He reports some thigh stiffness when he first gets up, feels better after walking around.    PHYSICAL EXAM: BP 130/70   Pulse 68   Temp 98.5 F (36.9 C) (Oral)   Resp 18   Ht 5\' 7"  (1.702 m)   Wt 176 lb 6.4 oz (80 kg)   BMI 27.63 kg/m  Chatty, pleasant male in no distress Skin: Very vague patches of mild erythema at left lateral and anterior hip, left thoracic back (flank area) and very faint and indistinct elsewhere. Redness only noticed on the left side, diffusely, none on the right.  It is slightly raised at the flank, but he has been scratching at it.  Very mild /faint excoriations noted.  No herald patch, no urticarial lesions, no pustules or vesicles.  ASSESSMENT/PLAN:  Rash and nonspecific skin eruption   Rash--nonspecific. Possibly related to new detergent vs other contact.   Take claritin or allegra or zyrtec or benadryl (but this should be taken at night only, since it causes sedation) to help with the itching. It is okay to continue the benadryl at night and take one of the others during the day. Try and avoid prolonged hot showers, and continue to moisturize your skin well. Continue the hydrocortisone cream up to three times daily, if needed for itching.  Contact us if the rash changes--develop  hives, raised bumps, blisters, if you develop any weeping, or open sores, fevers, other rashes (ie bulls-eye). I suspect this is an allergic reaction to something--not sure that we can pinpoint exactly what the exposure was.

## 2016-01-05 ENCOUNTER — Ambulatory Visit (INDEPENDENT_AMBULATORY_CARE_PROVIDER_SITE_OTHER): Payer: Medicare HMO | Admitting: Podiatry

## 2016-01-05 VITALS — Ht 68.0 in | Wt 170.0 lb

## 2016-01-05 DIAGNOSIS — L600 Ingrowing nail: Secondary | ICD-10-CM

## 2016-01-05 NOTE — Progress Notes (Signed)
   Subjective:    Patient ID: Logan Banks, male    DOB: 09/17/1940, 75 y.o.   MRN: [id number]  HPI  I have been having some pain with my right big toe nail I think it may be ingrown.  I do not want to do a procedure today because I am going out of the country next week.     Review of Systems  All other systems reviewed and are negative.      Objective:   Physical Exam        Assessment & Plan:

## 2016-01-06 NOTE — Progress Notes (Signed)
Subjective:     Patient ID: Logan BowensSheldon Wawrzyniak, male   DOB: 12/24/1940, 75 y.o.   MRN: [id number]  HPI patient presents stating he has an ingrown toenail on his right big toe that he wanted to get checked and he is leaving for [place] and would like to get it fixed when he returns   Review of Systems  All other systems reviewed and are negative.      Objective:   Physical Exam  Constitutional: He is oriented to person, place, and time.  Cardiovascular: Intact distal pulses.   Musculoskeletal: Normal range of motion.  Neurological: He is oriented to person, place, and time.  Skin: Skin is warm.  Nursing note and vitals reviewed.  neurovascular status intact with muscle strength adequate range of motion within normal limits with patient found to have an incurvated right hallux medial border that's painful when pressed and makes wearing shoe gear difficult. There is some distal redness noted but it's localized to have good digital perfusion and well oriented 3     Assessment:     Ingrown toenail deformity right hallux    Plan:     Reviewed H&P and correction and today did a sterile prep and did a debridement of the medial side with permanent procedure to be accomplished when patient returns

## 2016-01-12 ENCOUNTER — Other Ambulatory Visit (INDEPENDENT_AMBULATORY_CARE_PROVIDER_SITE_OTHER): Payer: Medicare HMO

## 2016-01-12 DIAGNOSIS — Z23 Encounter for immunization: Secondary | ICD-10-CM | POA: Diagnosis not present

## 2016-01-18 ENCOUNTER — Telehealth: Payer: Self-pay

## 2016-01-18 NOTE — Telephone Encounter (Signed)
Pt states that last Thursday he got the flu shot. He experienced muscle aches, ABD pain, diarrhea, vomiting, and headache. He states he has never gotten this sick from flu shot before. Advised pt that diarrhea, vomiting are not usually sx of flu shot. Pt states that he is starting to feel better now. Trixie Rude/RLB

## 2016-01-18 NOTE — Telephone Encounter (Signed)
Pt called back and states that he is going out of the country Monday and is still have the diarrhea and has taking 3 imodium and that is not helping, was wondering if there is something else he can do or take or should he come back in. Please call pt

## 2016-01-18 NOTE — Telephone Encounter (Signed)
Made pt appt for tomorrow! °

## 2016-01-18 NOTE — Telephone Encounter (Signed)
Have him come in for a visit.

## 2016-01-19 ENCOUNTER — Encounter: Payer: Self-pay | Admitting: Family Medicine

## 2016-01-19 ENCOUNTER — Ambulatory Visit (INDEPENDENT_AMBULATORY_CARE_PROVIDER_SITE_OTHER): Payer: Medicare HMO | Admitting: Family Medicine

## 2016-01-19 VITALS — BP 102/70 | HR 74 | Wt 175.0 lb

## 2016-01-19 DIAGNOSIS — A084 Viral intestinal infection, unspecified: Secondary | ICD-10-CM | POA: Diagnosis not present

## 2016-01-19 NOTE — Progress Notes (Signed)
   Subjective:    Patient ID: Candise BowensSheldon Feltes, male    DOB: 08/09/1940, 75 y.o.   MRN: [id number]  HPI  He is here for evaluation of diarrhea. Shortly after getting flu shot he developed fever, myalgias, diarrhea. He has no previous history of being on antibiotics, drinking well water or swimming in lakes. No fever, chills or bloody stool. He states that in the last day or so it started to improve. He does have a trip planned to [place].   Review of Systems     Objective:   Physical Exam Alert and in no distress otherwise not examined       Assessment & Plan:  Viral gastroenteritis Since he is improving, I will not pursue further evaluation. He will call if he has other difficulties.

## 2016-02-13 ENCOUNTER — Ambulatory Visit (INDEPENDENT_AMBULATORY_CARE_PROVIDER_SITE_OTHER): Payer: Medicare HMO | Admitting: Family Medicine

## 2016-02-13 ENCOUNTER — Ambulatory Visit
Admission: RE | Admit: 2016-02-13 | Discharge: 2016-02-13 | Disposition: A | Discharge status: Home / Self Care | Payer: Managed Care, Other (non HMO) | Source: Ambulatory Visit | Attending: Family Medicine | Admitting: Family Medicine

## 2016-02-13 ENCOUNTER — Encounter: Payer: Self-pay | Admitting: Family Medicine

## 2016-02-13 VITALS — BP 110/70 | HR 70 | Ht 68.0 in | Wt 176.6 lb

## 2016-02-13 DIAGNOSIS — M79652 Pain in left thigh: Secondary | ICD-10-CM | POA: Diagnosis not present

## 2016-02-13 NOTE — Progress Notes (Signed)
   Subjective:    Patient ID: Logan Banks, male    DOB: 11/15/1940, 75 y.o.   MRN: [id number]  HPI He complains of a six-month history of intermittent left thigh discomfort. It does tend to bother him more when he is up walking however he did note on 1 caging, changing his shoes did help. He does continue to walk without difficulty. He's had no knee or hip pain or other joints or bones involved with this.   Review of Systems     Objective:   Physical Exam Full motion of the left hip without pain. No palpable tenderness to the thigh. Skin is normal. Left knee exam shows no effusion will full motion and no laxity.       Assessment & Plan:  Left thigh pain - Plan: DG FEMUR MIN 2 VIEWS LEFT Explained that at this time there is no evidence of any major abnormality and we will start with a chest x-ray. He was comfortable with that.

## 2016-02-16 ENCOUNTER — Ambulatory Visit: Payer: Medicare HMO | Admitting: Podiatry

## 2016-02-23 ENCOUNTER — Ambulatory Visit
Admission: RE | Admit: 2016-02-23 | Discharge: 2016-02-23 | Disposition: A | Discharge status: Home / Self Care | Payer: Managed Care, Other (non HMO) | Source: Ambulatory Visit | Attending: Family Medicine | Admitting: Family Medicine

## 2016-02-23 ENCOUNTER — Ambulatory Visit (INDEPENDENT_AMBULATORY_CARE_PROVIDER_SITE_OTHER): Payer: Medicare HMO | Admitting: Family Medicine

## 2016-02-23 ENCOUNTER — Encounter: Payer: Self-pay | Admitting: Family Medicine

## 2016-02-23 VITALS — BP 90/56 | HR 110 | Temp 98.1°F | Wt 178.0 lb

## 2016-02-23 DIAGNOSIS — R1084 Generalized abdominal pain: Secondary | ICD-10-CM

## 2016-02-23 DIAGNOSIS — R630 Anorexia: Secondary | ICD-10-CM | POA: Diagnosis not present

## 2016-02-23 LAB — CBC WITH DIFFERENTIAL/PLATELET
BASOS ABS: 0 {cells}/uL (ref 0–200)
Basophils Relative: 0 %
EOS ABS: 0 {cells}/uL — AB (ref 15–500)
EOS PCT: 0 %
HCT: 46.9 % (ref 38.5–50.0)
HEMOGLOBIN: 16.9 g/dL (ref 13.2–17.1)
LYMPHS ABS: 258 {cells}/uL — AB (ref 850–3900)
Lymphocytes Relative: 2 %
MCH: 32 pg (ref 27.0–33.0)
MCHC: 36 g/dL (ref 32.0–36.0)
MCV: 88.8 fL (ref 80.0–100.0)
MPV: 9.7 fL (ref 7.5–12.5)
Monocytes Absolute: 387 cells/uL (ref 200–950)
Monocytes Relative: 3 %
NEUTROS ABS: 12255 {cells}/uL — AB (ref 1500–7800)
Neutrophils Relative %: 95 %
Platelets: 178 10*3/uL (ref 140–400)
RBC: 5.28 MIL/uL (ref 4.20–5.80)
RDW: 12.5 % (ref 11.0–15.0)
WBC: 12.9 10*3/uL — ABNORMAL HIGH (ref 4.0–10.5)

## 2016-02-23 LAB — BASIC METABOLIC PANEL
BUN: 24 mg/dL (ref 7–25)
CALCIUM: 8.8 mg/dL (ref 8.6–10.3)
CHLORIDE: 103 mmol/L (ref 98–110)
CO2: 25 mmol/L (ref 20–31)
Creat: 1.08 mg/dL (ref 0.70–1.18)
GLUCOSE: 123 mg/dL — AB (ref 65–99)
Potassium: 4.4 mmol/L (ref 3.5–5.3)
SODIUM: 137 mmol/L (ref 135–146)

## 2016-02-23 MED ORDER — ONDANSETRON HCL 4 MG PO TABS: 4.0000 mg | ORAL_TABLET | Freq: Three times a day (TID) | ORAL | 0 refills | Status: DC | PRN

## 2016-02-23 NOTE — Progress Notes (Signed)
   Subjective:    Patient ID: Logan Banks, male    DOB: 08/18/1940, 75 y.o.   MRN: [id number]  HPI He is here for evaluation of chills, nausea, abdominal pain and fullness with anorexia. His last bowel movement was 1 or 2 days ago. He's had no previous surgeries. He's had no difficulty with nausea. We 1 week ago he did have difficulty with coughing and sneezing however this has diminished. He does not drink.   Review of Systems     Objective:   Physical Exam Alert and in some distress. Cardiac exam shows regular rhythm without murmurs or gallops. Lungs are clear to auscultation. Abdominal exam shows questionable high-pitched tingling bowel sounds but no masses, tenderness or rebound.       Assessment & Plan:  Generalized abdominal pain - Plan: CBC with Differential/Platelet, Basic Metabolic Panel, DG Abd 2 Views  Anorexia - Plan: CBC with Differential/Platelet, Basic Metabolic Panel, DG Abd 2 Views  Blood work did show elevated white count and x-ray showed a left renal calculus which I think is not related to the present problem. I relayed this information to him. I will give him ondansetron to help with his nausea and we will continue to monitor the situation. He was comfortable with that.

## 2016-03-26 ENCOUNTER — Other Ambulatory Visit: Payer: Self-pay | Admitting: Family Medicine

## 2016-03-26 DIAGNOSIS — E785 Hyperlipidemia, unspecified: Secondary | ICD-10-CM

## 2016-04-02 ENCOUNTER — Encounter: Payer: Self-pay | Admitting: Podiatry

## 2016-04-02 ENCOUNTER — Ambulatory Visit (INDEPENDENT_AMBULATORY_CARE_PROVIDER_SITE_OTHER): Payer: Medicare HMO | Admitting: Podiatry

## 2016-04-02 VITALS — BP 119/74 | HR 94 | Resp 16

## 2016-04-02 DIAGNOSIS — L600 Ingrowing nail: Secondary | ICD-10-CM | POA: Diagnosis not present

## 2016-04-02 NOTE — Progress Notes (Signed)
Subjective:     Patient ID: Logan Banks, male   DOB: 08/21/1940, 75 y.o.   MRN: [id number]  HPI patient states I know I need to get this ingrown fixed but I may not do it now is I am working every day until the end of the year   Review of Systems     Objective:   Physical Exam Neurovascular status intact with ingrown toenail deformity hallux right that's chronic in nature with thickness of the nailbed and discomfort in the corner    Assessment:     Ingrown toenail deformity right hallux    Plan:     Sterile debridement of nailbeds accomplished today and advised long-term on permanent procedure and advised patient on permanent procedure

## 2016-04-02 NOTE — Patient Instructions (Addendum)

## 2016-06-04 ENCOUNTER — Ambulatory Visit: Payer: Medicare HMO | Admitting: Podiatry

## 2016-06-14 ENCOUNTER — Ambulatory Visit (INDEPENDENT_AMBULATORY_CARE_PROVIDER_SITE_OTHER): Payer: Medicare HMO | Admitting: Family Medicine

## 2016-06-14 ENCOUNTER — Encounter: Payer: Self-pay | Admitting: Family Medicine

## 2016-06-14 VITALS — BP 110/72 | HR 75 | Wt 177.0 lb

## 2016-06-14 DIAGNOSIS — S7012XA Contusion of left thigh, initial encounter: Secondary | ICD-10-CM

## 2016-06-14 NOTE — Progress Notes (Signed)
   Subjective:    Patient ID: Candise BowensSheldon Sisler, male    DOB: 12/14/1940, 76 y.o.   MRN: [id number]  HPI 10 days ago a TV injured his left thigh and cause some bruising. He still having some swelling and discomfort in the distal left thigh area.   Review of Systems     Objective:   Physical Exam Exam of the left thigh does show the distal have to be slightly discolored with a yellowish tint and a daily feeling in that area as well as slight tenderness to palpation. The upper thighs normal. Knee is normal       Assessment & Plan:  Contusion of anterior thigh, left, initial encounter Recommend conservative care with heat for 20 minutes 3 or 4 times per day and pain medication as needed.

## 2016-06-15 ENCOUNTER — Telehealth: Payer: Self-pay

## 2016-06-15 NOTE — Telephone Encounter (Signed)
Pt questions if he can take anything else other than Tylenol and Ibuprofen for leg pain. Please advise. Pt CB#  [phone]

## 2016-06-15 NOTE — Telephone Encounter (Signed)
He can take ibuprofen or naproxyn with tylenol

## 2016-06-15 NOTE — Telephone Encounter (Signed)
Pt notified of recommendations. /RLB 

## 2016-06-18 ENCOUNTER — Telehealth: Payer: Self-pay | Admitting: Family Medicine

## 2016-06-18 NOTE — Telephone Encounter (Signed)
Pt called & stated Dr. Jennye BoroughsMedoff's office called to schedule a colonoscopy & he wanted to know if you recommended if he have a colonoscopy due to his age? Please advise

## 2016-06-22 ENCOUNTER — Telehealth: Payer: Self-pay | Admitting: Family Medicine

## 2016-06-22 NOTE — Telephone Encounter (Signed)
Pt said side of his leg and calf area is still swollen. Should he come in to seen again early next week of wait another week or two?

## 2016-06-22 NOTE — Telephone Encounter (Signed)
Pt informed word for word and pt verbalized understanding

## 2016-06-22 NOTE — Telephone Encounter (Signed)
Keep using heat on it minutes 3 times per day and wait another week or so.

## 2016-07-10 DIAGNOSIS — Z800 Family history of malignant neoplasm of digestive organs: Secondary | ICD-10-CM | POA: Diagnosis not present

## 2016-07-10 DIAGNOSIS — Z1211 Encounter for screening for malignant neoplasm of colon: Secondary | ICD-10-CM | POA: Diagnosis not present

## 2016-07-10 DIAGNOSIS — K648 Other hemorrhoids: Secondary | ICD-10-CM | POA: Diagnosis not present

## 2016-07-10 DIAGNOSIS — Z86010 Personal history of colonic polyps: Secondary | ICD-10-CM | POA: Diagnosis not present

## 2016-07-10 LAB — HM COLONOSCOPY

## 2016-07-12 ENCOUNTER — Telehealth: Payer: Self-pay | Admitting: Family Medicine

## 2016-07-12 ENCOUNTER — Other Ambulatory Visit: Payer: Self-pay | Admitting: Medical

## 2016-07-12 MED ORDER — ALPRAZOLAM 0.25 MG PO TABS: 0.2500 mg | ORAL_TABLET | Freq: Every evening | ORAL | 0 refills | Status: DC | PRN

## 2016-07-12 NOTE — Telephone Encounter (Signed)
Please call #[phone] no refill out since Dr. [name] is out of office.

## 2016-07-12 NOTE — Telephone Encounter (Signed)
[facility] [address] [facility] pt request.

## 2016-07-12 NOTE — Telephone Encounter (Signed)
Requesting refill on Alprazolam 0.25mg °

## 2016-07-16 ENCOUNTER — Other Ambulatory Visit: Payer: Self-pay | Admitting: Family Medicine

## 2016-07-16 ENCOUNTER — Encounter: Payer: Self-pay | Admitting: Family Medicine

## 2016-07-16 DIAGNOSIS — E785 Hyperlipidemia, unspecified: Secondary | ICD-10-CM

## 2016-09-10 ENCOUNTER — Encounter: Payer: Self-pay | Admitting: Family Medicine

## 2016-09-10 ENCOUNTER — Ambulatory Visit (INDEPENDENT_AMBULATORY_CARE_PROVIDER_SITE_OTHER): Payer: Medicare HMO | Admitting: Family Medicine

## 2016-09-10 VITALS — BP 120/70 | HR 70 | Wt 177.0 lb

## 2016-09-10 DIAGNOSIS — M7582 Other shoulder lesions, left shoulder: Secondary | ICD-10-CM | POA: Diagnosis not present

## 2016-09-10 NOTE — Progress Notes (Signed)
   Subjective:    Patient ID: Logan Banks, male    DOB: June 13, 1940, 76 y.o.   MRN: [id number]  HPI He injured his left shoulder 3 weeks ago doing rowing exercises with the shoulder abducted and internally rotated. Since then has been slowly getting better.   Review of Systems     Objective:   Physical Exam Full motion of the shoulder. No palpable tenderness. Negative drop arm test. Neer's and Hawkins test was uncomfortable.       Assessment & Plan:  Tendinitis of left rotator cuff since is getting better, will continue with conservative care. Did demonstrate proper internal and external rotation with the arm at 90 and abducted. Explained eccentric an eccentric contractions.

## 2016-10-19 ENCOUNTER — Emergency Department (HOSPITAL_COMMUNITY)
Admission: EM | Admit: 2016-10-19 | Discharge: 2016-10-19 | Disposition: A | Discharge status: Home / Self Care | Payer: Medicare HMO | Attending: Emergency Medicine | Admitting: Emergency Medicine

## 2016-10-19 ENCOUNTER — Ambulatory Visit (INDEPENDENT_AMBULATORY_CARE_PROVIDER_SITE_OTHER): Payer: Medicare HMO | Admitting: Family Medicine

## 2016-10-19 ENCOUNTER — Encounter (HOSPITAL_COMMUNITY): Payer: Self-pay

## 2016-10-19 ENCOUNTER — Encounter: Payer: Self-pay | Admitting: Family Medicine

## 2016-10-19 VITALS — BP 112/70 | HR 80 | Wt 175.0 lb

## 2016-10-19 DIAGNOSIS — Z5321 Procedure and treatment not carried out due to patient leaving prior to being seen by health care provider: Secondary | ICD-10-CM | POA: Diagnosis not present

## 2016-10-19 DIAGNOSIS — W06XXXA Fall from bed, initial encounter: Secondary | ICD-10-CM | POA: Diagnosis not present

## 2016-10-19 DIAGNOSIS — S0990XA Unspecified injury of head, initial encounter: Secondary | ICD-10-CM | POA: Insufficient documentation

## 2016-10-19 DIAGNOSIS — Y9301 Activity, walking, marching and hiking: Secondary | ICD-10-CM | POA: Insufficient documentation

## 2016-10-19 DIAGNOSIS — Y999 Unspecified external cause status: Secondary | ICD-10-CM | POA: Insufficient documentation

## 2016-10-19 DIAGNOSIS — S0003XA Contusion of scalp, initial encounter: Secondary | ICD-10-CM | POA: Diagnosis not present

## 2016-10-19 DIAGNOSIS — Y92013 Bedroom of single-family (private) house as the place of occurrence of the external cause: Secondary | ICD-10-CM | POA: Insufficient documentation

## 2016-10-19 NOTE — ED Triage Notes (Signed)
Pt woke up quickly and fell into his nightstand, his head bled a small amount and he complains of neck pain

## 2016-10-19 NOTE — Progress Notes (Signed)
   Subjective:    Patient ID: Candise BowensSheldon Lansdowne, male    DOB: 02/21/1941, 76 y.o.   MRN: [id number]  HPI He fell out of bed last night injuring the left parietal area. He did not lose consciousness. He did go to the emergency room but spent 3 hours there without being seen. Presently he has had no headache, nausea, vomiting, visual changes, loss of orientation   Review of Systems     Objective:   Physical Exam Alert and in no distress. EOMI. Small contusion of approximately 2 cm node in the left parietal area. Normal motor, sensory and DTRs were normal cerebellar.       Assessment & Plan:  Contusion of scalp, initial encounter  Recommend conservative care as I do not think he is in any danger. Did discuss worsening headache, vomiting, loss of orientation.

## 2016-10-19 NOTE — ED Notes (Signed)
Pt reported to Talbert NanJ Oxendine, RN that he was leaving and would go see his dr this morning

## 2016-11-05 ENCOUNTER — Ambulatory Visit (INDEPENDENT_AMBULATORY_CARE_PROVIDER_SITE_OTHER): Payer: Medicare HMO | Admitting: Family Medicine

## 2016-11-05 ENCOUNTER — Encounter: Payer: Self-pay | Admitting: Family Medicine

## 2016-11-05 VITALS — BP 122/70 | HR 86 | Wt 177.0 lb

## 2016-11-05 DIAGNOSIS — M79602 Pain in left arm: Secondary | ICD-10-CM

## 2016-11-05 DIAGNOSIS — M7582 Other shoulder lesions, left shoulder: Secondary | ICD-10-CM | POA: Diagnosis not present

## 2016-11-05 MED ORDER — TRIAMCINOLONE ACETONIDE 40 MG/ML IJ SUSP
40.0000 mg | Freq: Once | INTRAMUSCULAR | Status: AC
  Administered 2016-11-05: 40 mg via INTRAMUSCULAR

## 2016-11-05 MED ORDER — LIDOCAINE HCL 2 % IJ SOLN
3.0000 mL | Freq: Once | INTRAMUSCULAR | Status: AC
  Administered 2016-11-05: 60 mg via INTRADERMAL

## 2016-11-05 NOTE — Progress Notes (Signed)
   Subjective:    Patient ID: Logan Banks, male    DOB: 10/20/1940, 76 y.o.   MRN: [id number]  HPI He continues to have difficulty with left arm pain and is especially having difficulty when he abducts and externally rotates his arm. He complains of pain mainly in the deltoid area. Conservative care so far has not been successful.   Review of Systems     Objective:   Physical Exam Alert and in no distress. Full motion of the shoulder. Negative drop arm test although there was uncomfortable. Neer's and Hawkins test was again slightly uncomfortable. Negative sulcus test. No tenderness over the before meals joint or over the bicipital groove.       Assessment & Plan:  Left arm pain - Plan: lidocaine (XYLOCAINE) 2 % (with pres) injection 60 mg, triamcinolone acetonide (KENALOG-40) injection 40 mg  Rotator cuff tendinitis, left  I discussed various options with him concerning physical therapy or possible referral for ultrasound. We have elected to inject the shoulder. The left shoulder was prepped with Betadine. 40 mg of Kenalog and 3 mL of Xylocaine was injected into the subacromial bursa without difficulty. If he continues have difficulty, referral to sports medicine for probable ultrasound will be made.

## 2016-11-07 ENCOUNTER — Telehealth: Payer: Self-pay | Admitting: Family Medicine

## 2016-11-07 NOTE — Telephone Encounter (Signed)
Pt requesting refill on [facility]ialis [facility] fax # [phone]

## 2016-11-07 NOTE — Telephone Encounter (Signed)
#  [phone] refills

## 2016-11-08 ENCOUNTER — Other Ambulatory Visit: Payer: Self-pay

## 2016-11-08 NOTE — Telephone Encounter (Signed)
Left message for pt to call me back 

## 2016-11-08 NOTE — Telephone Encounter (Signed)
Pt will call back tomorrow with Number

## 2016-11-08 NOTE — Telephone Encounter (Signed)
I have faxed rx to [facility] [place]

## 2016-11-19 DIAGNOSIS — H524 Presbyopia: Secondary | ICD-10-CM | POA: Diagnosis not present

## 2016-11-19 DIAGNOSIS — H52203 Unspecified astigmatism, bilateral: Secondary | ICD-10-CM | POA: Diagnosis not present

## 2017-01-09 ENCOUNTER — Encounter: Payer: Self-pay | Admitting: Family Medicine

## 2017-01-09 ENCOUNTER — Ambulatory Visit (INDEPENDENT_AMBULATORY_CARE_PROVIDER_SITE_OTHER): Payer: Medicare HMO | Admitting: Family Medicine

## 2017-01-09 VITALS — BP 124/70 | HR 83 | Temp 98.1°F | Wt 175.8 lb

## 2017-01-09 DIAGNOSIS — K4091 Unilateral inguinal hernia, without obstruction or gangrene, recurrent: Secondary | ICD-10-CM

## 2017-01-09 DIAGNOSIS — J029 Acute pharyngitis, unspecified: Secondary | ICD-10-CM | POA: Diagnosis not present

## 2017-01-09 LAB — POCT RAPID STREP A (OFFICE): Rapid Strep A Screen: NEGATIVE

## 2017-01-09 NOTE — Progress Notes (Signed)
   Subjective:    Patient ID: Logan Banks, male    DOB: 07-27-1940, 76 y.o.   MRN: [id number]  HPI He complains of a 2 day history of sore throat and earache but no fever, chills, cough, congestion, shortness of breath. He also has concerns over fullness in the right groin area. He has a previous history of surgery for hernia repair.   Review of Systems     Objective:   Physical Exam Alert and in no distress. Tympanic membranes and canals are normal. Pharyngeal area is normal. Neck is supple without adenopathy or thyromegaly. Cardiac exam shows a regular sinus rhythm without murmurs or gallops. Lungs are clear to auscultation. Fullness and bulging noted in the right inguinal area. Testes normal.  Strep Screen negative      Assessment & Plan:  Unilateral recurrent inguinal hernia without obstruction or gangrene - Plan: Ambulatory referral to General Surgery  Pharyngitis, unspecified etiology - Plan: Rapid Strep A  Supportive care for the pharyngitis symptoms. Referral back to the general surgeon that did the original surgery.

## 2017-01-28 ENCOUNTER — Other Ambulatory Visit: Payer: Self-pay | Admitting: Family Medicine

## 2017-01-28 DIAGNOSIS — K4091 Unilateral inguinal hernia, without obstruction or gangrene, recurrent: Secondary | ICD-10-CM | POA: Diagnosis not present

## 2017-01-28 DIAGNOSIS — E785 Hyperlipidemia, unspecified: Secondary | ICD-10-CM

## 2017-02-05 ENCOUNTER — Ambulatory Visit: Payer: Medicare HMO | Admitting: Family Medicine

## 2017-02-19 ENCOUNTER — Telehealth: Payer: Self-pay | Admitting: Family Medicine

## 2017-02-19 MED ORDER — ATORVASTATIN CALCIUM 10 MG PO TABS: 10.0000 mg | ORAL_TABLET | Freq: Every day | ORAL | 0 refills | Status: DC

## 2017-02-19 NOTE — Telephone Encounter (Signed)
[facility]  Pt has med check appt next month.

## 2017-02-20 ENCOUNTER — Telehealth: Payer: Self-pay | Admitting: Family Medicine

## 2017-02-20 MED ORDER — ATORVASTATIN CALCIUM 10 MG PO TABS: 10.0000 mg | ORAL_TABLET | Freq: Every day | ORAL | 0 refills | Status: DC

## 2017-02-20 NOTE — Telephone Encounter (Signed)
Patient has AWV in Nov, ok'd by [employer]

## 2017-02-20 NOTE — Telephone Encounter (Signed)
Recv'd [facility] for 90 day supply for Atorvastatin 10 mg

## 2017-02-21 ENCOUNTER — Other Ambulatory Visit (INDEPENDENT_AMBULATORY_CARE_PROVIDER_SITE_OTHER): Payer: Medicare HMO

## 2017-02-21 DIAGNOSIS — Z23 Encounter for immunization: Secondary | ICD-10-CM

## 2017-03-08 DIAGNOSIS — M7521 Bicipital tendinitis, right shoulder: Secondary | ICD-10-CM | POA: Diagnosis not present

## 2017-04-17 ENCOUNTER — Encounter: Payer: Self-pay | Admitting: Family Medicine

## 2017-04-17 ENCOUNTER — Ambulatory Visit (INDEPENDENT_AMBULATORY_CARE_PROVIDER_SITE_OTHER): Payer: Medicare HMO | Admitting: Family Medicine

## 2017-04-17 VITALS — BP 140/80 | HR 64 | Ht 67.0 in | Wt 175.0 lb

## 2017-04-17 DIAGNOSIS — E785 Hyperlipidemia, unspecified: Secondary | ICD-10-CM

## 2017-04-17 DIAGNOSIS — J301 Allergic rhinitis due to pollen: Secondary | ICD-10-CM | POA: Diagnosis not present

## 2017-04-17 DIAGNOSIS — N400 Enlarged prostate without lower urinary tract symptoms: Secondary | ICD-10-CM | POA: Diagnosis not present

## 2017-04-17 DIAGNOSIS — Z8249 Family history of ischemic heart disease and other diseases of the circulatory system: Secondary | ICD-10-CM

## 2017-04-17 DIAGNOSIS — Z86010 Personal history of colonic polyps: Secondary | ICD-10-CM

## 2017-04-17 LAB — CBC WITH DIFFERENTIAL/PLATELET
BASOS ABS: 21 {cells}/uL (ref 0–200)
Basophils Relative: 0.4 %
EOS ABS: 37 {cells}/uL (ref 15–500)
Eosinophils Relative: 0.7 %
HCT: 48.3 % (ref 38.5–50.0)
Hemoglobin: 16.3 g/dL (ref 13.2–17.1)
Lymphs Abs: 763 cells/uL — ABNORMAL LOW (ref 850–3900)
MCH: 30.4 pg (ref 27.0–33.0)
MCHC: 33.7 g/dL (ref 32.0–36.0)
MCV: 89.9 fL (ref 80.0–100.0)
MONOS PCT: 8.2 %
MPV: 10.6 fL (ref 7.5–12.5)
Neutro Abs: 4044 cells/uL (ref 1500–7800)
Neutrophils Relative %: 76.3 %
Platelets: 217 10*3/uL (ref 140–400)
RBC: 5.37 10*6/uL (ref 4.20–5.80)
RDW: 11.4 % (ref 11.0–15.0)
TOTAL LYMPHOCYTE: 14.4 %
WBC mixed population: 435 cells/uL (ref 200–950)
WBC: 5.3 10*3/uL (ref 3.8–10.8)

## 2017-04-17 LAB — COMPREHENSIVE METABOLIC PANEL
AG Ratio: 1.3 (calc) (ref 1.0–2.5)
ALT: 25 U/L (ref 9–46)
AST: 21 U/L (ref 10–35)
Albumin: 4.2 g/dL (ref 3.6–5.1)
Alkaline phosphatase (APISO): 57 U/L (ref 40–115)
BUN: 21 mg/dL (ref 7–25)
CO2: 28 mmol/L (ref 20–32)
CREATININE: 0.95 mg/dL (ref 0.70–1.18)
Calcium: 9.7 mg/dL (ref 8.6–10.3)
Chloride: 105 mmol/L (ref 98–110)
GLUCOSE: 94 mg/dL (ref 65–99)
Globulin: 3.3 g/dL (calc) (ref 1.9–3.7)
Potassium: 4.9 mmol/L (ref 3.5–5.3)
Sodium: 140 mmol/L (ref 135–146)
Total Bilirubin: 0.7 mg/dL (ref 0.2–1.2)
Total Protein: 7.5 g/dL (ref 6.1–8.1)

## 2017-04-17 LAB — LIPID PANEL
Cholesterol: 153 mg/dL (ref <200)
HDL: 41 mg/dL (ref >40)
LDL Cholesterol (Calc): 95 mg/dL (calc)
NON-HDL CHOLESTEROL (CALC): 112 mg/dL (ref <130)
TRIGLYCERIDES: 83 mg/dL (ref <150)
Total CHOL/HDL Ratio: 3.7 (calc) (ref <5.0)

## 2017-04-17 NOTE — Progress Notes (Signed)
Logan Banks is a 76 y.o. male who presents for annual wellness visit and follow-up on chronic medical conditions.  He has the following concerns: He does have a right inguinal hernia and will probably have this repaired sometime next year.  He continues on atorvastatin and is having no difficulty with that.  He rarely needs Cialis for sexual function.  He gets regular follow-up concerning his colonic polyps.  Apparently his most recent examination was negative.  His allergies are under good control.   Immunizations and Health Maintenance Immunization History  Administered Date(s) Administered  . Influenza Split 02/13/2013  . Influenza, High Dose Seasonal PF 02/15/2014, 01/26/2015, 01/12/2016, 02/21/2017  . Pneumococcal Conjugate-13 12/03/2013  . Pneumococcal Polysaccharide-23 12/15/2012  . Tdap 08/30/2010  . Zoster 12/07/2013   There are no preventive care reminders to display for this patient.  Last colonoscopy: 01/2016  [facility]. Last PSA:  11/2016 Dentist:  Yes Dolly RiasJamal Odeh [facility]htho: [facility] Exercise: yes- 3-4 days walking and at gym .  Other doctors caring for patient include: Fraser DinJohn O'Hollarn- PT.   Advanced Directives: yes- in chart    Depression screen:  See questionnaire below.     Depression screen [facility] 04/17/2017 01/09/2017 12/13/2015 12/01/2014 03/18/2013  Decreased Interest 0 0 0 0 0  Down, Depressed, Hopeless 0 0 0 0 0  PHQ - 2 Score 0 0 0 0 0    Fall Screen: See Questionaire below.   Fall Risk  04/17/2017 01/09/2017 12/13/2015 12/01/2014 03/18/2013  Falls in the past year? No No No No No    ADL screen:  See questionnaire below.  Functional Status Survey: Is the patient deaf or have difficulty hearing?: No Does the patient have difficulty seeing, even when wearing glasses/contacts?: No Does the patient have difficulty concentrating, remembering, or making decisions?: No Does the patient have difficulty walking or climbing stairs?: No Does the patient  have difficulty dressing or bathing?: No Does the patient have difficulty doing errands alone such as visiting a doctor's office or shopping?: No   Review of Systems  Constitutional: -, -unexpected weight change, -anorexia, -fatigue Allergy: -sneezing, -itching, -congestion Dermatology: denies changing moles, rash, lumps ENT: -runny nose, -ear pain, -sore throat,  Cardiology:  -chest pain, -palpitations, -orthopnea, Respiratory: -cough, -shortness of breath, -dyspnea on exertion, -wheezing,  Gastroenterology: -abdominal pain, -nausea, -vomiting, -diarrhea, -constipation, -dysphagia Hematology: -bleeding or bruising problems Musculoskeletal: -arthralgias, -myalgias, -joint swelling, -back pain, - Ophthalmology: -vision changes,  Urology: -dysuria, -difficulty urinating,  -urinary frequency, -urgency, incontinence Neurology: -, -numbness, , -memory loss, -falls, -dizziness    PHYSICAL EXAM:  General Appearance: Alert, cooperative, no distress, appears stated age Head: Normocephalic, without obvious abnormality, atraumatic Eyes: PERRL, conjunctiva/corneas clear, EOM's intact, fundi benign Ears: Normal TM's and external ear canals Nose: Nares normal, mucosa normal, no drainage or sinus   tenderness Throat: Lips, mucosa, and tongue normal; teeth and gums normal Neck: Supple, no lymphadenopathy, thyroid:no enlargement/tenderness/nodules; no carotid bruit or JVD Lungs: Clear to auscultation bilaterally without wheezes, rales or ronchi; respirations unlabored Heart: Regular rate and rhythm, S1 and S2 normal, no murmur, rub or gallop Abdomen: Soft, non-tender, nondistended, normoactive bowel sounds, no masses, no hepatosplenomegaly Extremities: No clubbing, cyanosis or edema Pulses: 2+ and symmetric all extremities Skin: Skin color, texture, turgor normal, no rashes or lesions Lymph nodes: Cervical, supraclavicular, and axillary nodes normal Neurologic: CNII-XII intact, normal strength,  sensation and gait; reflexes 2+ and symmetric throughout   Psych: Normal mood, affect, hygiene and grooming  ASSESSMENT/PLAN: History of  colonic polyps  Benign prostatic hyperplasia, unspecified whether lower urinary tract symptoms present - Plan: CBC with Differential/Platelet, Comprehensive metabolic panel  Hyperlipidemia, unspecified hyperlipidemia type - Plan: CBC with Differential/Platelet, Comprehensive metabolic panel, Lipid panel  Seasonal allergic rhinitis due to pollen - Plan: CBC with Differential/Platelet, Comprehensive metabolic panel  Family history of heart disease in male family member before age 74 - Plan: CBC with Differential/Platelet, Comprehensive metabolic panel, Lipid panel     . Immunization recommendations discussed.  Colonoscopy recommendations reviewed.   Medicare Attestation I have personally reviewed: The patient's medical and social history Their use of alcohol, tobacco or illicit drugs Their current medications and supplements The patient's functional ability including ADLs,fall risks, home safety risks, cognitive, and hearing and visual impairment Diet and physical activities Evidence for depression or mood disorders  The patient's weight, height, and BMI have been recorded in the chart.  I have made referrals, counseling, and provided education to the patient based on review of the above and I have provided the patient with a written personalized care plan for preventive services.     Sharlot GowdaJohn Amira Podolak, MD   04/17/2017

## 2017-05-10 DIAGNOSIS — H43812 Vitreous degeneration, left eye: Secondary | ICD-10-CM | POA: Diagnosis not present

## 2017-05-13 ENCOUNTER — Other Ambulatory Visit: Payer: Self-pay | Admitting: Family Medicine

## 2017-05-13 DIAGNOSIS — R1084 Generalized abdominal pain: Secondary | ICD-10-CM

## 2017-05-16 ENCOUNTER — Telehealth: Payer: Self-pay | Admitting: Family Medicine

## 2017-05-16 MED ORDER — ATORVASTATIN CALCIUM 10 MG PO TABS: 10.0000 mg | ORAL_TABLET | Freq: Every day | ORAL | 0 refills | Status: DC

## 2017-05-16 NOTE — Telephone Encounter (Signed)
Recv'd refill request form [facility] for Atorvastatin 10mg  #[phone]

## 2017-05-16 NOTE — Telephone Encounter (Signed)
rx sent

## 2017-06-06 ENCOUNTER — Ambulatory Visit: Payer: Self-pay | Admitting: General Surgery

## 2017-06-06 DIAGNOSIS — K4091 Unilateral inguinal hernia, without obstruction or gangrene, recurrent: Secondary | ICD-10-CM | POA: Diagnosis not present

## 2017-06-06 NOTE — H&P (Signed)
History of Present Illness Logan Filler MD; 06/06/2017 9:48 AM) The patient is a 77 year old male who presents with an inguinal hernia. Update: Patient has had no changes in his health or the state of the hernia since last clinic visit. He does state that he continues with a right inguinal hernia and bulge. It is give him pain on and off as previously discussed.  --------------------------- Referred by: Dr. Sharlot Gowda Chief Complaint: Recurrent right inguinal hernia  Patient is a 77 year old male who states that he had a previous open right inguinal hernia by Dr. Daphine Deutscher in 2005/06. He states that he notices an asymmetrical bulge to the right inguinal area. He states he has some twinges of pain that are minor. There is no severity to them. He states that it comes and goes. [place] He does do some heavy lifting. There are no other modifying factors at this time. Patient has had no signs or symptoms of incarceration or strangulation.    Allergies (Tanisha A. Manson Passey, RMA; 06/06/2017 9:32 AM) No Known Drug Allergies [01/28/2017]: Allergies Reconciled   Medication History (Tanisha A. Manson Passey, RMA; 06/06/2017 9:32 AM) Aspirin EC (81MG  Tablet DR, Oral) Active. Atorvastatin Calcium (10MG  Tablet, Oral) Active. Multiple Vitamins-Minerals (Oral) Active. Fish Oil + D3 (Oral) Specific strength unknown - Active. Tadalafil (20MG  Tablet, Oral) Active. Medications Reconciled    Review of Systems Logan Filler, MD; 06/06/2017 9:48 AM) General Not Present- Appetite Loss, Chills, Fatigue, Fever, Night Sweats, Weight Gain and Weight Loss. Skin Not Present- Change in Wart/Mole, Dryness, Hives, Jaundice, New Lesions, Non-Healing Wounds, Rash and Ulcer. HEENT Present- Seasonal Allergies. Not Present- Earache, Hearing Loss, Hoarseness, Nose Bleed, Oral Ulcers, Ringing in the Ears, Sinus Pain, Sore Throat, Visual Disturbances, Wears glasses/contact lenses and Yellow  Eyes. Breast Not Present- Breast Mass, Breast Pain, Nipple Discharge and Skin Changes. Cardiovascular Not Present- Chest Pain, Difficulty Breathing Lying Down, Leg Cramps, Palpitations, Rapid Heart Rate, Shortness of Breath and Swelling of Extremities. Gastrointestinal Present- Bloating. Not Present- Abdominal Pain, Bloody Stool, Change in Bowel Habits, Chronic diarrhea, Constipation, Difficulty Swallowing, Excessive gas, Gets full quickly at meals, Hemorrhoids, Indigestion, Nausea, Rectal Pain and Vomiting. Male Genitourinary Not Present- Blood in Urine, Change in Urinary Stream, Frequency, Impotence, Nocturia, Painful Urination, Urgency and Urine Leakage. All other systems negative  Vitals (Tanisha A. Brown RMA; 06/06/2017 9:32 AM) 06/06/2017 9:32 AM Weight: 178.8 lb Height: 68in Body Surface Area: 1.95 m Body Mass Index: 27.19 kg/m  Temp.: 98.69F  Pulse: 81 (Regular)  BP: 118/84 (Sitting, Left Arm, Standard)       Physical Exam Logan Filler, MD; 06/06/2017 9:48 AM) The physical exam findings are as follows: Note: Constitutional: No acute distress, conversant, appears stated age  Eyes: Anicteric sclerae, moist conjunctiva, no lid lag  Neck: No thyromegaly, trachea midline, no cervical lymphadenopathy  Lungs: Clear to auscultation biilaterally, normal respiratory effot  Cardiovascular: regular rate & rhythm, no murmurs, no peripheal edema, pedal pulses 2+  GI: Soft, no masses or hepatosplenomegaly, non-tender to palpation  MSK: Normal gait, no clubbing cyanosis, edema  Skin: No rashes, palpation reveals normal skin turgor  Psychiatric: Appropriate judgment and insight, oriented to person, place, and time  Abdomen Inspection Hernias - Inguinal hernia - Right - Reducible.    Assessment & Plan Logan Filler MD; 06/06/2017 9:48 AM) RECURRENT RIGHT INGUINAL HERNIA (K40.91) Impression: Patient is a 77 year old male with a recurrent right inguinal  hernia. 1. The patient will like to proceed to the operating  room for laparoscopic right inguinal hernia repair with mesh.  2. I discussed with the patient the signs and symptoms of incarceration and strangulation and the need to proceed to the ER should they occur.  3. I discussed with the patient the risks and benefits of the procedure to include but not limited to: Infection, bleeding, damage to surrounding structures, possible need for further surgery, possible nerve pain, and possible recurrence. The patient was understanding and wishes to proceed.

## 2017-06-11 ENCOUNTER — Other Ambulatory Visit: Payer: Self-pay | Admitting: Family Medicine

## 2017-06-11 ENCOUNTER — Telehealth: Payer: Self-pay

## 2017-06-11 NOTE — Telephone Encounter (Signed)
Spoke to pt about medicine that has been requested by his pharmacy. Atorvastatin 10 mg . Pt was called to see if he wanted to schedule his mgmt at this time and ppt let me know that he had a 90 days supply on hand now and he would let us know when he is close to running out. Thanks Colgate-PalmoliveKH

## 2017-06-13 ENCOUNTER — Telehealth: Payer: Self-pay | Admitting: Family Medicine

## 2017-06-13 MED ORDER — ATORVASTATIN CALCIUM 10 MG PO TABS: 10.0000 mg | ORAL_TABLET | Freq: Every day | ORAL | 3 refills | Status: DC

## 2017-06-13 MED ORDER — OMEGA-3-ACID ETHYL ESTERS 1 G PO CAPS: 1.0000 g | ORAL_CAPSULE | Freq: Every day | ORAL | 3 refills | Status: DC

## 2017-06-13 NOTE — Telephone Encounter (Signed)
Pt was here 11/18 for CPE, would like refill on Atorvastatin for year, said that you don't usually require him to come in except once a year.

## 2017-06-28 NOTE — Pre-Procedure Instructions (Signed)
Logan BowensSheldon Banks  06/28/2017      [id number] [place], [place] - [address] [place] [id number] Phone: [phone] Fax: [phone]    Your procedure is scheduled on February 18  [facility].M[facility]  Call this number if you have problems the morning of surgery:  [phone]   Remember:  Do not eat food or drink liquids after midnight.  Continue all medications as directed by your physician except follow these medication instructions before surgery below   Take these medicines the morning of surgery with A SIP OF WATER  ALPRAZolam (XANAX) chlorhexidine (PERIDEX) ondansetron (ZOFRAN if needed oxymetazoline (AFRIN)   7 days prior to surgery STOP taking any Aspirin(unless otherwise instructed by your surgeon), Aleve, Naproxen, Ibuprofen, Motrin, Advil, Goody's, BC's, all herbal medications, fish oil, and all vitamins  Follow your doctors instructions regarding your Aspirin.  If no instructions were given by your doctor, then you will need to call the prescribing office office to get instructions.      Do not wear jewelry  Do not wear lotions, powders, or cologne, or deodorant.  Men may shave face and neck.  Do not bring valuables to the hospital.  [facility] is not responsible for any belongings or valuables.  Contacts, dentures or bridgework may not be worn into surgery.  Leave your suitcase in the car.  After surgery it may be brought to your room.  For patients admitted to the hospital, discharge time will be determined by your treatment team.  Patients discharged the day of surgery will not be allowed to drive home.    Special instructions:   [place]- Preparing For Surgery  Before surgery, you can play an important role. Because skin is not sterile, your skin needs to be as free of germs as possible. You can reduce the number of germs on your skin by washing with CHG (chlorahexidine gluconate)  Soap before surgery.  CHG is an antiseptic cleaner which kills germs and bonds with the skin to continue killing germs even after washing.  Please do not use if you have an allergy to CHG or antibacterial soaps. If your skin becomes reddened/irritated stop using the CHG.  Do not shave (including legs and underarms) for at least 48 hours prior to first CHG shower. It is OK to shave your face.  Please follow these instructions carefully.   1. Shower the NIGHT BEFORE SURGERY and the MORNING OF SURGERY with CHG.   2. If you chose to wash your hair, wash your hair first as usual with your normal shampoo.  3. After you shampoo, rinse your hair and body thoroughly to remove the shampoo.  4. Use CHG as you would any other liquid soap. You can apply CHG directly to the skin and wash gently with a scrungie or a clean washcloth.   5. Apply the CHG Soap to your body ONLY FROM THE NECK DOWN.  Do not use on open wounds or open sores. Avoid contact with your eyes, ears, mouth and genitals (private parts). Wash Face and genitals (private parts)  with your normal soap.  6. Wash thoroughly, paying special attention to the area where your surgery will be performed.  7. Thoroughly rinse your body with warm water from the neck down.  8. DO NOT shower/wash with your normal soap after using and rinsing off the CHG Soap.  9. Pat yourself dry with a CLEAN TOWEL.  10.  Wear CLEAN PAJAMAS to bed the night before surgery, wear comfortable clothes the morning of surgery  11. Place CLEAN SHEETS on your bed the night of your first shower and DO NOT SLEEP WITH PETS.    Day of Surgery: Do not apply any deodorants/lotions. Please wear clean clothes to the hospital/surgery center.      Please read over the following fact sheets that you were given.

## 2017-07-01 ENCOUNTER — Other Ambulatory Visit: Payer: Self-pay

## 2017-07-01 ENCOUNTER — Encounter (HOSPITAL_COMMUNITY): Payer: Self-pay

## 2017-07-01 ENCOUNTER — Encounter (HOSPITAL_COMMUNITY)
Admission: RE | Admit: 2017-07-01 | Discharge: 2017-07-01 | Disposition: A | Discharge status: Home / Self Care | Payer: Medicare HMO | Source: Ambulatory Visit | Attending: General Surgery | Admitting: General Surgery

## 2017-07-01 DIAGNOSIS — Z01812 Encounter for preprocedural laboratory examination: Secondary | ICD-10-CM | POA: Diagnosis present

## 2017-07-01 HISTORY — DX: Adhesive capsulitis of unspecified shoulder: M75.00

## 2017-07-01 HISTORY — DX: Other specified postprocedural states: R11.2

## 2017-07-01 HISTORY — DX: Cardiac arrhythmia, unspecified: I49.9

## 2017-07-01 HISTORY — DX: Other specified postprocedural states: Z98.890

## 2017-07-01 LAB — BASIC METABOLIC PANEL
ANION GAP: 10 (ref 5–15)
BUN: 21 mg/dL — ABNORMAL HIGH (ref 6–20)
CALCIUM: 9.2 mg/dL (ref 8.9–10.3)
CO2: 20 mmol/L — AB (ref 22–32)
CREATININE: 0.96 mg/dL (ref 0.61–1.24)
Chloride: 106 mmol/L (ref 101–111)
GFR calc Af Amer: >60 mL/min (ref >60)
Glucose, Bld: 94 mg/dL (ref 65–99)
Potassium: 5.4 mmol/L — ABNORMAL HIGH (ref 3.5–5.1)
SODIUM: 136 mmol/L (ref 135–145)

## 2017-07-01 LAB — HEMOGLOBIN: Hemoglobin: 16.1 g/dL (ref 13.0–17.0)

## 2017-07-01 NOTE — Progress Notes (Signed)
PCP - Sharlot GowdaJohn [name] Cardiologist - Verdis PrimeHenry Smith - hasnt seen in 5 years just PRN  Chest x-ray - not needed EKG - not needed, no HTN Stress Test - 15 years ECHO - 7years Cardiac Cath - denies    Aspirin Instructions: last dose 06/30/17  Anesthesia review: YES,   Patient denies shortness of breath, fever, cough and chest pain at PAT appointment   Patient verbalized understanding of instructions that were given to them at the PAT appointment. Patient was also instructed that they will need to review over the PAT instructions again at home before surgery.

## 2017-07-02 ENCOUNTER — Encounter (HOSPITAL_COMMUNITY): Payer: Self-pay

## 2017-07-08 ENCOUNTER — Encounter (HOSPITAL_COMMUNITY): Payer: Self-pay | Admitting: *Deleted

## 2017-07-08 ENCOUNTER — Ambulatory Visit (HOSPITAL_COMMUNITY): Payer: Medicare HMO | Admitting: Anesthesiology

## 2017-07-08 ENCOUNTER — Ambulatory Visit (HOSPITAL_COMMUNITY): Payer: Medicare HMO | Admitting: Emergency Medicine

## 2017-07-08 ENCOUNTER — Encounter (HOSPITAL_COMMUNITY)
Admission: RE | Disposition: A | Discharge status: Home / Self Care | Payer: Self-pay | Source: Ambulatory Visit | Attending: General Surgery

## 2017-07-08 ENCOUNTER — Ambulatory Visit (HOSPITAL_COMMUNITY)
Admission: RE | Admit: 2017-07-08 | Discharge: 2017-07-08 | Disposition: A | Discharge status: Home / Self Care | Payer: Medicare HMO | Source: Ambulatory Visit | Attending: General Surgery | Admitting: General Surgery

## 2017-07-08 ENCOUNTER — Other Ambulatory Visit: Payer: Self-pay

## 2017-07-08 DIAGNOSIS — Z7982 Long term (current) use of aspirin: Secondary | ICD-10-CM | POA: Insufficient documentation

## 2017-07-08 DIAGNOSIS — Z79899 Other long term (current) drug therapy: Secondary | ICD-10-CM | POA: Diagnosis not present

## 2017-07-08 DIAGNOSIS — K4031 Unilateral inguinal hernia, with obstruction, without gangrene, recurrent: Secondary | ICD-10-CM | POA: Insufficient documentation

## 2017-07-08 DIAGNOSIS — E785 Hyperlipidemia, unspecified: Secondary | ICD-10-CM | POA: Diagnosis not present

## 2017-07-08 DIAGNOSIS — N400 Enlarged prostate without lower urinary tract symptoms: Secondary | ICD-10-CM | POA: Diagnosis not present

## 2017-07-08 DIAGNOSIS — Z87891 Personal history of nicotine dependence: Secondary | ICD-10-CM | POA: Insufficient documentation

## 2017-07-08 DIAGNOSIS — K4091 Unilateral inguinal hernia, without obstruction or gangrene, recurrent: Secondary | ICD-10-CM | POA: Diagnosis not present

## 2017-07-08 DIAGNOSIS — Z885 Allergy status to narcotic agent status: Secondary | ICD-10-CM | POA: Diagnosis not present

## 2017-07-08 HISTORY — PX: INSERTION OF MESH: SHX5868

## 2017-07-08 HISTORY — PX: INGUINAL HERNIA REPAIR: SHX194

## 2017-07-08 SURGERY — REPAIR, HERNIA, INGUINAL, LAPAROSCOPIC
Anesthesia: General | Site: Groin | Laterality: Right

## 2017-07-08 MED ORDER — ROCURONIUM BROMIDE 100 MG/10ML IV SOLN
INTRAVENOUS | Status: DC | PRN
  Administered 2017-07-08: 50 mg via INTRAVENOUS

## 2017-07-08 MED ORDER — BUPIVACAINE HCL 0.25 % IJ SOLN
INTRAMUSCULAR | Status: DC | PRN
  Administered 2017-07-08: 7 mL

## 2017-07-08 MED ORDER — SODIUM CHLORIDE 0.9 % IV SOLN: 250.0000 mL | INTRAVENOUS | Status: DC | PRN

## 2017-07-08 MED ORDER — FENTANYL CITRATE (PF) 100 MCG/2ML IJ SOLN
INTRAMUSCULAR | Status: AC
  Filled 2017-07-08: qty 2

## 2017-07-08 MED ORDER — ACETAMINOPHEN 500 MG PO TABS
1000.0000 mg | ORAL_TABLET | ORAL | Status: AC
  Administered 2017-07-08: 1000 mg via ORAL
  Filled 2017-07-08: qty 2

## 2017-07-08 MED ORDER — LIDOCAINE HCL (CARDIAC) 20 MG/ML IV SOLN
INTRAVENOUS | Status: DC | PRN
  Administered 2017-07-08: 100 mg via INTRAVENOUS

## 2017-07-08 MED ORDER — CEFAZOLIN SODIUM-DEXTROSE 2-4 GM/100ML-% IV SOLN
2.0000 g | INTRAVENOUS | Status: AC
  Administered 2017-07-08: 2 g via INTRAVENOUS
  Filled 2017-07-08: qty 100

## 2017-07-08 MED ORDER — SUGAMMADEX SODIUM 200 MG/2ML IV SOLN
INTRAVENOUS | Status: DC | PRN
  Administered 2017-07-08: 162.2 mg via INTRAVENOUS

## 2017-07-08 MED ORDER — DEXAMETHASONE SODIUM PHOSPHATE 10 MG/ML IJ SOLN
INTRAMUSCULAR | Status: AC
  Filled 2017-07-08: qty 1

## 2017-07-08 MED ORDER — OXYCODONE HCL 5 MG PO TABS
ORAL_TABLET | ORAL | Status: AC
  Filled 2017-07-08: qty 2

## 2017-07-08 MED ORDER — TRAMADOL HCL 50 MG PO TABS: 50.0000 mg | ORAL_TABLET | Freq: Four times a day (QID) | ORAL | 0 refills | Status: DC | PRN

## 2017-07-08 MED ORDER — ACETAMINOPHEN 650 MG RE SUPP: 650.0000 mg | RECTAL | Status: DC | PRN

## 2017-07-08 MED ORDER — CHLORHEXIDINE GLUCONATE CLOTH 2 % EX PADS: 6.0000 | MEDICATED_PAD | Freq: Once | CUTANEOUS | Status: DC

## 2017-07-08 MED ORDER — ONDANSETRON HCL 4 MG/2ML IJ SOLN
INTRAMUSCULAR | Status: AC
  Filled 2017-07-08: qty 2

## 2017-07-08 MED ORDER — 0.9 % SODIUM CHLORIDE (POUR BTL) OPTIME
TOPICAL | Status: DC | PRN
  Administered 2017-07-08: 1000 mL

## 2017-07-08 MED ORDER — MIDAZOLAM HCL 5 MG/5ML IJ SOLN
INTRAMUSCULAR | Status: DC | PRN
  Administered 2017-07-08: 2 mg via INTRAVENOUS

## 2017-07-08 MED ORDER — OXYCODONE HCL 5 MG PO TABS
5.0000 mg | ORAL_TABLET | ORAL | Status: DC | PRN
  Administered 2017-07-08: 10 mg via ORAL

## 2017-07-08 MED ORDER — MIDAZOLAM HCL 2 MG/2ML IJ SOLN
INTRAMUSCULAR | Status: AC
  Filled 2017-07-08: qty 2

## 2017-07-08 MED ORDER — ROCURONIUM BROMIDE 10 MG/ML (PF) SYRINGE
PREFILLED_SYRINGE | INTRAVENOUS | Status: AC
Start: 2017-07-08 — End: ?
  Filled 2017-07-08: qty 5

## 2017-07-08 MED ORDER — ACETAMINOPHEN 325 MG PO TABS: 650.0000 mg | ORAL_TABLET | ORAL | Status: DC | PRN

## 2017-07-08 MED ORDER — CELECOXIB 200 MG PO CAPS
200.0000 mg | ORAL_CAPSULE | ORAL | Status: AC
  Administered 2017-07-08: 200 mg via ORAL
  Filled 2017-07-08: qty 1

## 2017-07-08 MED ORDER — PROPOFOL 10 MG/ML IV BOLUS
INTRAVENOUS | Status: DC | PRN
  Administered 2017-07-08: 150 mg via INTRAVENOUS

## 2017-07-08 MED ORDER — PROPOFOL 10 MG/ML IV BOLUS
INTRAVENOUS | Status: AC
  Filled 2017-07-08: qty 20

## 2017-07-08 MED ORDER — LIDOCAINE 2% (20 MG/ML) 5 ML SYRINGE
INTRAMUSCULAR | Status: AC
  Filled 2017-07-08: qty 5

## 2017-07-08 MED ORDER — BUPIVACAINE HCL (PF) 0.25 % IJ SOLN
INTRAMUSCULAR | Status: AC
  Filled 2017-07-08: qty 30

## 2017-07-08 MED ORDER — FENTANYL CITRATE (PF) 100 MCG/2ML IJ SOLN
25.0000 ug | INTRAMUSCULAR | Status: DC | PRN
  Administered 2017-07-08 (×2): 50 ug via INTRAVENOUS

## 2017-07-08 MED ORDER — SUGAMMADEX SODIUM 200 MG/2ML IV SOLN
INTRAVENOUS | Status: AC
Start: 2017-07-08 — End: ?
  Filled 2017-07-08: qty 2

## 2017-07-08 MED ORDER — LACTATED RINGERS IV SOLN
INTRAVENOUS | Status: DC
  Administered 2017-07-08 (×2): via INTRAVENOUS

## 2017-07-08 MED ORDER — GABAPENTIN 300 MG PO CAPS
300.0000 mg | ORAL_CAPSULE | ORAL | Status: AC
  Administered 2017-07-08: 300 mg via ORAL
  Filled 2017-07-08: qty 1

## 2017-07-08 MED ORDER — ONDANSETRON HCL 4 MG/2ML IJ SOLN
INTRAMUSCULAR | Status: DC | PRN
  Administered 2017-07-08: 4 mg via INTRAVENOUS

## 2017-07-08 MED ORDER — SUGAMMADEX SODIUM 200 MG/2ML IV SOLN
INTRAVENOUS | Status: AC
  Filled 2017-07-08: qty 2

## 2017-07-08 MED ORDER — DEXAMETHASONE SODIUM PHOSPHATE 10 MG/ML IJ SOLN
INTRAMUSCULAR | Status: DC | PRN
  Administered 2017-07-08: 10 mg via INTRAVENOUS

## 2017-07-08 MED ORDER — SODIUM CHLORIDE 0.9% FLUSH: 3.0000 mL | INTRAVENOUS | Status: DC | PRN

## 2017-07-08 MED ORDER — FENTANYL CITRATE (PF) 100 MCG/2ML IJ SOLN
INTRAMUSCULAR | Status: DC | PRN
  Administered 2017-07-08: 100 ug via INTRAVENOUS

## 2017-07-08 MED ORDER — SODIUM CHLORIDE 0.9% FLUSH: 3.0000 mL | Freq: Two times a day (BID) | INTRAVENOUS | Status: DC

## 2017-07-08 MED ORDER — FENTANYL CITRATE (PF) 250 MCG/5ML IJ SOLN
INTRAMUSCULAR | Status: AC
  Filled 2017-07-08: qty 5

## 2017-07-08 MED ORDER — MORPHINE SULFATE (PF) 2 MG/ML IV SOLN: 2.0000 mg | INTRAVENOUS | Status: DC | PRN

## 2017-07-08 SURGICAL SUPPLY — 47 items
APL SKNCLS STERI-STRIP NONHPOA (GAUZE/BANDAGES/DRESSINGS) ×1
APPLIER CLIP 5 13 M/L LIGAMAX5 (MISCELLANEOUS)
APR CLP MED LRG 5 ANG JAW (MISCELLANEOUS)
BENZOIN TINCTURE PRP APPL 2/3 (GAUZE/BANDAGES/DRESSINGS) ×2 IMPLANT
CANISTER SUCT 3000ML PPV (MISCELLANEOUS) IMPLANT
CHLORAPREP W/TINT 26ML (MISCELLANEOUS) ×2 IMPLANT
CLIP APPLIE 5 13 M/L LIGAMAX5 (MISCELLANEOUS) IMPLANT
COVER SURGICAL LIGHT HANDLE (MISCELLANEOUS) ×2 IMPLANT
DISSECTOR BLUNT TIP ENDO 5MM (MISCELLANEOUS) IMPLANT
ELECT REM PT RETURN 9FT ADLT (ELECTROSURGICAL) ×2
ELECTRODE REM PT RTRN 9FT ADLT (ELECTROSURGICAL) ×1 IMPLANT
ENDOLOOP SUT PDS II  0 18 (SUTURE) ×1
ENDOLOOP SUT PDS II 0 18 (SUTURE) IMPLANT
GAUZE SPONGE 2X2 8PLY STRL LF (GAUZE/BANDAGES/DRESSINGS) ×1 IMPLANT
GLOVE BIO SURGEON STRL SZ7.5 (GLOVE) ×2 IMPLANT
GOWN STRL REUS W/ TWL LRG LVL3 (GOWN DISPOSABLE) ×2 IMPLANT
GOWN STRL REUS W/ TWL XL LVL3 (GOWN DISPOSABLE) ×1 IMPLANT
GOWN STRL REUS W/TWL LRG LVL3 (GOWN DISPOSABLE) ×4
GOWN STRL REUS W/TWL XL LVL3 (GOWN DISPOSABLE) ×2
KIT BASIN OR (CUSTOM PROCEDURE TRAY) ×2 IMPLANT
KIT ROOM TURNOVER OR (KITS) ×2 IMPLANT
MESH 3DMAX 5X7 RT XLRG (Mesh General) ×1 IMPLANT
NDL INSUFFLATION 14GA 120MM (NEEDLE) IMPLANT
NEEDLE INSUFFLATION 14GA 120MM (NEEDLE) IMPLANT
NS IRRIG 1000ML POUR BTL (IV SOLUTION) ×2 IMPLANT
PAD ARMBOARD 7.5X6 YLW CONV (MISCELLANEOUS) ×4 IMPLANT
RELOAD STAPLE 4.0 BLU F/HERNIA (INSTRUMENTS) ×1 IMPLANT
RELOAD STAPLE 4.8 BLK F/HERNIA (STAPLE) IMPLANT
RELOAD STAPLE HERNIA 4.0 BLUE (INSTRUMENTS) ×2 IMPLANT
RELOAD STAPLE HERNIA 4.8 BLK (STAPLE) IMPLANT
SCISSORS LAP 5X35 DISP (ENDOMECHANICALS) ×2 IMPLANT
SET IRRIG TUBING LAPAROSCOPIC (IRRIGATION / IRRIGATOR) IMPLANT
SET TROCAR LAP APPLE-HUNT 5MM (ENDOMECHANICALS) ×2 IMPLANT
SPONGE GAUZE 2X2 STER 10/PKG (GAUZE/BANDAGES/DRESSINGS) ×1
STAPLER HERNIA 12 8.5 360D (INSTRUMENTS) ×2 IMPLANT
STRIP CLOSURE SKIN 1/2X4 (GAUZE/BANDAGES/DRESSINGS) ×2 IMPLANT
SUT MNCRL AB 4-0 PS2 18 (SUTURE) ×2 IMPLANT
SUT VIC AB 1 CT1 27 (SUTURE)
SUT VIC AB 1 CT1 27XBRD ANBCTR (SUTURE) IMPLANT
SYRINGE TOOMEY DISP (SYRINGE) ×1 IMPLANT
TOWEL OR 17X24 6PK STRL BLUE (TOWEL DISPOSABLE) ×2 IMPLANT
TOWEL OR 17X26 10 PK STRL BLUE (TOWEL DISPOSABLE) ×2 IMPLANT
TRAY FOLEY CATH SILVER 14FR (SET/KITS/TRAYS/PACK) ×1 IMPLANT
TRAY LAPAROSCOPIC MC (CUSTOM PROCEDURE TRAY) ×2 IMPLANT
TROCAR XCEL 12X100 BLDLESS (ENDOMECHANICALS) ×2 IMPLANT
TUBING INSUFFLATION (TUBING) ×2 IMPLANT
WATER STERILE IRR 1000ML POUR (IV SOLUTION) ×2 IMPLANT

## 2017-07-08 NOTE — Op Note (Signed)
07/08/2017  10:08 AM  PATIENT:  Candise BowensSheldon Kady  77 y.o. male  PRE-OPERATIVE DIAGNOSIS:  Recurrent right inguinal hernia  POST-OPERATIVE DIAGNOSIS:  Recurrent, incarcerated right indirect inguinal hernia  PROCEDURE:  Procedure(s): LAPAROSCOPIC RIGHT  INGUINAL HERNIA REPAIR (Right) INSERTION OF MESH (Right)  SURGEON:  Surgeon(s) and Role:    Axel Filler* Dalasia Predmore, MD - Primary  ANESTHESIA:   local and general  EBL:  10 mL   BLOOD ADMINISTERED:none  DRAINS: none   LOCAL MEDICATIONS USED:  BUPIVICAINE   SPECIMEN:  No Specimen  DISPOSITION OF SPECIMEN:  N/A  COUNTS:  YES  TOURNIQUET:  * No tourniquets in log *  DICTATION: .Dragon Dictation   Counts: reported as correct x 2  Findings:  The patient had a large recurrent indirect hernia  Indications for procedure:  The patient is a 77 year old male with a right recurrent inguinal hernia for several months. Patient complained of symptomatology to his right inguinal area. The patient was taken back for elective inguinal hernia repair.  Details of the procedure: The patient was taken back to the operating room. The patient was placed in supine position with bilateral SCDs in place.  The patient was prepped and draped in the usual sterile fashion.  After appropriate anitbiotics were confirmed, a time-out was confirmed and all facts were verified.  0.25% Marcaine was used to infiltrate the umbilical area. A 11-blade was used to cut down the skin and blunt dissection was used to get the anterior fashion.  The anterior fascia was incised approximately 1 cm and the muscles were retracted laterally. Blunt dissection was then used to create a space in the preperitoneal area. At this time a 10 mm camera was then introduced into the space and advanced the pubic tubercle and a 12 mm trocar was placed over this and insufflation was started.  At this time and space was created from medial to laterally the preperitoneal space.  Cooper's ligament  was initially cleaned off.  The hernia sac was identified in the indirect space. Dissection of the hernia sac was undertaken. The hernia was incarcerated and densely scarred in place. The vas deferens was identified and protected in all parts of the case.  There was a small tear into the hernia sac. A Veress needle right upper quadrant to help evacuate the intraperitoneal air.  A PDS endoloop was used to ligate the peritoneum at the tear and encapsulated the the entire tear.  Once the hernia sac was taken down to approximately the umbilicus a Bard 3D Max mesh, size: Barney DrainXLarge, was  introduced into the preperitoneal space.  The mesh was brought over to cover the direct and indirect hernia spaces.  This was anchored into place and secured to Cooper's ligament with 4.670mm staples from a Coviden hernia stapler. It was anchored to the anterior abdominal wall with 4.8 mm staples. The hernia sac was seen lying posterior to the mesh. There was no staples placed laterally. The insufflation was evacuated and the peritoneum was seen posterior to the mesh. The trochars were removed. The anterior fascia was reapproximated using #1 Vicryl on a UR- 6.  Intra-abdominal air was evacuated and the Veress needle removed. The skin was reapproximated using 4-0 Monocryl subcuticular fashion the patient was awakened from general anesthesia and taken to recovery in stable condition.   PLAN OF CARE: Discharge to home after PACU  PATIENT DISPOSITION:  PACU - hemodynamically stable.   Delay start of Pharmacological VTE agent (>24hrs) due to surgical blood loss or  risk of bleeding: not applicable

## 2017-07-08 NOTE — H&P (Signed)
History of Present Illness The patient is a 77 year old male who presents with an inguinal hernia. Update: Patient has had no changes in his health or the state of the hernia since last clinic visit. He does state that he continues with a right inguinal hernia and bulge. It is give him pain on and off as previously discussed.  --------------------------- Referred by: Dr. Sharlot Gowda Chief Complaint: Recurrent right inguinal hernia  Patient is a 78 year old male who states that he had a previous open right inguinal hernia by Dr. Daphine Deutscher in 2005/06. He states that he notices an asymmetrical bulge to the right inguinal area. He states he has some twinges of pain that are minor. There is no severity to them. He states that it comes and goes. [place] He does do some heavy lifting. There are no other modifying factors at this time. Patient has had no signs or symptoms of incarceration or strangulation.    Allergies No Known Drug Allergies [01/28/2017]: Allergies Reconciled   Medication History  Aspirin EC (81MG  Tablet DR, Oral) Active. Atorvastatin Calcium (10MG  Tablet, Oral) Active. Multiple Vitamins-Minerals (Oral) Active. Fish Oil + D3 (Oral) Specific strength unknown - Active. Tadalafil (20MG  Tablet, Oral) Active. Medications Reconciled    Review of Systems  General Not Present- Appetite Loss, Chills, Fatigue, Fever, Night Sweats, Weight Gain and Weight Loss. Skin Not Present- Change in Wart/Mole, Dryness, Hives, Jaundice, New Lesions, Non-Healing Wounds, Rash and Ulcer. HEENT Present- Seasonal Allergies. Not Present- Earache, Hearing Loss, Hoarseness, Nose Bleed, Oral Ulcers, Ringing in the Ears, Sinus Pain, Sore Throat, Visual Disturbances, Wears glasses/contact lenses and Yellow Eyes. Breast Not Present- Breast Mass, Breast Pain, Nipple Discharge and Skin Changes. Cardiovascular Not Present- Chest Pain, Difficulty Breathing Lying Down,  Leg Cramps, Palpitations, Rapid Heart Rate, Shortness of Breath and Swelling of Extremities. Gastrointestinal Present- Bloating. Not Present- Abdominal Pain, Bloody Stool, Change in Bowel Habits, Chronic diarrhea, Constipation, Difficulty Swallowing, Excessive gas, Gets full quickly at meals, Hemorrhoids, Indigestion, Nausea, Rectal Pain and Vomiting. Male Genitourinary Not Present- Blood in Urine, Change in Urinary Stream, Frequency, Impotence, Nocturia, Painful Urination, Urgency and Urine Leakage. All other systems negative  BP 138/74   Pulse 74   Temp 97.9 F (36.6 C) (Oral)   Resp 18   SpO2 99%       Physical Exam ( The physical exam findings are as follows: Note: Constitutional: No acute distress, conversant, appears stated age  Eyes: Anicteric sclerae, moist conjunctiva, no lid lag  Neck: No thyromegaly, trachea midline, no cervical lymphadenopathy  Lungs: Clear to auscultation biilaterally, normal respiratory effot  Cardiovascular: regular rate & rhythm, no murmurs, no peripheal edema, pedal pulses 2+  GI: Soft, no masses or hepatosplenomegaly, non-tender to palpation  MSK: Normal gait, no clubbing cyanosis, edema  Skin: No rashes, palpation reveals normal skin turgor  Psychiatric: Appropriate judgment and insight, oriented to person, place, and time  Abdomen Inspection Hernias - Inguinal hernia - Right - Reducible.    Assessment & Plan  RECURRENT RIGHT INGUINAL HERNIA (K40.91) Impression: Patient is a 77 year old male with a recurrent right inguinal hernia. 1. The patient will like to proceed to the operating room for laparoscopic right inguinal hernia repair with mesh.  2. I discussed with the patient the signs and symptoms of incarceration and strangulation and the need to proceed to the ER should they occur.  3. I discussed with the patient the risks and benefits of the procedure to include but not  limited to: Infection, bleeding,  damage to surrounding structures, possible need for further surgery, possible nerve pain, and possible recurrence. The patient was understanding and wishes to proceed.

## 2017-07-08 NOTE — Anesthesia Procedure Notes (Signed)
Procedure Name: Intubation Date/Time: 07/08/2017 9:02 AM Performed by: Lowella Dell, CRNA Pre-anesthesia Checklist: Patient identified, Emergency Drugs available, Suction available and Patient being monitored Patient Re-evaluated:Patient Re-evaluated prior to induction Oxygen Delivery Method: Circle System Utilized Preoxygenation: Pre-oxygenation with 100% oxygen Induction Type: IV induction Ventilation: Mask ventilation without difficulty Laryngoscope Size: Mac Grade View: Grade I Tube type: Oral Number of attempts: 1 Airway Equipment and Method: Stylet Placement Confirmation: ETT inserted through vocal cords under direct vision,  positive ETCO2 and breath sounds checked- equal and bilateral Secured at: 23 cm Tube secured with: Tape Dental Injury: Teeth and Oropharynx as per pre-operative assessment

## 2017-07-08 NOTE — Transfer of Care (Signed)
Immediate Anesthesia Transfer of Care Note  Patient: Logan Banks  Procedure(s) Performed: LAPAROSCOPIC RIGHT  INGUINAL HERNIA REPAIR (Right Groin) INSERTION OF MESH (Right Groin)  Patient Location: PACU  Anesthesia Type:General  Level of Consciousness: awake, drowsy and patient cooperative  Airway & Oxygen Therapy: Patient Spontanous Breathing and Patient connected to nasal cannula oxygen  Post-op Assessment: Report given to RN, Post -op Vital signs reviewed and stable and Patient moving all extremities X 4  Post vital signs: Reviewed and stable  Last Vitals:  Vitals:   07/08/17 0749 07/08/17 1017  BP: 138/74   Pulse: 74   Resp: 18 (P) 13  Temp: 36.6 C (!) (P) 36.2 C  SpO2: 99%     Last Pain:  Vitals:   07/08/17 0749  TempSrc: Oral         Complications: No apparent anesthesia complications

## 2017-07-08 NOTE — Transfer of Care (Deleted)
Immediate Anesthesia Transfer of Care Note  Patient: Logan Banks  Procedure(s) Performed: LAPAROSCOPIC RIGHT  INGUINAL HERNIA REPAIR (Right ) INSERTION OF MESH (Right )  Patient Location: PACU  Anesthesia Type:General  Level of Consciousness: awake, alert  and oriented  Airway & Oxygen Therapy: Patient Spontanous Breathing and Patient connected to nasal cannula oxygen  Post-op Assessment: Report given to RN, Post -op Vital signs reviewed and stable and Patient moving all extremities X 4  Post vital signs: Reviewed and stable  Last Vitals:  Vitals:   07/08/17 0749  BP: 138/74  Pulse: 74  Resp: 18  Temp: 36.6 C  SpO2: 99%    Last Pain:  Vitals:   07/08/17 0749  TempSrc: Oral         Complications: No apparent anesthesia complications

## 2017-07-08 NOTE — Anesthesia Postprocedure Evaluation (Signed)
Anesthesia Post Note  Patient: Candise BowensSheldon Jasso  Procedure(s) Performed: LAPAROSCOPIC RIGHT  INGUINAL HERNIA REPAIR (Right Groin) INSERTION OF MESH (Right Groin)     Patient location during evaluation: PACU Anesthesia Type: General Level of consciousness: awake and alert Pain management: pain level controlled Vital Signs Assessment: post-procedure vital signs reviewed and stable Respiratory status: spontaneous breathing, nonlabored ventilation and respiratory function stable Cardiovascular status: blood pressure returned to baseline and stable Postop Assessment: no apparent nausea or vomiting Anesthetic complications: no    Last Vitals:  Vitals:   07/08/17 1108 07/08/17 1139  BP: (!) 159/85 (!) 145/80  Pulse: 68 67  Resp: 15 16  Temp:    SpO2: 98% 97%    Last Pain:  Vitals:   07/08/17 1108  TempSrc:   PainSc: 3                  Beryle Lathehomas E Christyanna Mckeon

## 2017-07-08 NOTE — Anesthesia Preprocedure Evaluation (Signed)
Anesthesia Evaluation  Patient identified by MRN, date of birth, ID band Patient awake    Reviewed: Allergy & Precautions, H&P , Patient's Chart, lab work & pertinent test results, reviewed documented beta blocker date and time   Airway Mallampati: II  TM Distance: >3 FB Neck ROM: full    Dental no notable dental hx.    Pulmonary former smoker,    Pulmonary exam normal breath sounds clear to auscultation       Cardiovascular  Rhythm:regular Rate:Normal     Neuro/Psych    GI/Hepatic   Endo/Other    Renal/GU      Musculoskeletal   Abdominal   Peds  Hematology   Anesthesia Other Findings   Reproductive/Obstetrics                             Anesthesia Physical Anesthesia Plan  ASA: II  Anesthesia Plan: General   Post-op Pain Management:    Induction: Intravenous  PONV Risk Score and Plan: 2 and Dexamethasone, Ondansetron and Treatment may vary due to age or medical condition  Airway Management Planned: Oral ETT  Additional Equipment:   Intra-op Plan:   Post-operative Plan: Extubation in OR  Informed Consent: I have reviewed the patients History and Physical, chart, labs and discussed the procedure including the risks, benefits and alternatives for the proposed anesthesia with the patient or authorized representative who has indicated his/her understanding and acceptance.   Dental Advisory Given  Plan Discussed with: CRNA and Surgeon  Anesthesia Plan Comments: (  )        Anesthesia Quick Evaluation

## 2017-07-08 NOTE — Discharge Instructions (Signed)
[facility] _______[facility] ° °UMBILICAL OR INGUINAL HERNIA REPAIR: POST OP INSTRUCTIONS ° °Always review your discharge instruction sheet given to you by the facility where your surgery was performed. °IF YOU HAVE DISABILITY OR FAMILY LEAVE FORMS, YOU MUST BRING THEM TO THE OFFICE FOR PROCESSING.   °DO NOT GIVE THEM TO YOUR DOCTOR. ° °1. A  prescription for pain medication may be given to you upon discharge.  Take your pain medication as prescribed, if needed.  If narcotic pain medicine is not needed, then you may take acetaminophen (Tylenol) or ibuprofen (Advil) as needed. °2. Take your usually prescribed medications unless otherwise directed. °If you need a refill on your pain medication, please contact your pharmacy.  They will contact our office to request authorization. Prescriptions will not be filled after 5 pm or on week-ends. °3. You should follow a light diet the first 24 hours after arrival home, such as soup and crackers, etc.  Be sure to include lots of fluids daily.  Resume your normal diet the day after surgery. °4.Most patients will experience some swelling and bruising around the umbilicus or in the groin and scrotum.  Ice packs and reclining will help.  Swelling and bruising can take several days to resolve.  °6. It is common to experience some constipation if taking pain medication after surgery.  Increasing fluid intake and taking a stool softener (such as Colace) will usually help or prevent this problem from occurring.  A mild laxative (Milk of Magnesia or Miralax) should be taken according to package directions if there are no bowel movements after 48 hours. °7. Unless discharge instructions indicate otherwise, you may remove your bandages 24-48 hours after surgery, and you may shower at that time.  You may have steri-strips (small skin tapes) in place directly over the incision.  These strips should be left on the skin for 7-10 days.  If your surgeon used skin glue on the  incision, you may shower in 24 hours.  The glue will flake off over the next 2-3 weeks.  Any sutures or staples will be removed at the office during your follow-up visit. °8. ACTIVITIES:  You may resume regular (light) daily activities beginning the next day--such as daily self-care, walking, climbing stairs--gradually increasing activities as tolerated.  You may have sexual intercourse when it is comfortable.  Refrain from any heavy lifting or straining until approved by your doctor. ° °a.You may drive when you are no longer taking prescription pain medication, you can comfortably wear a seatbelt, and you can safely maneuver your car and apply brakes. °b.RETURN TO WORK:   °_____________________________________________ ° °9.You should see your doctor in the office for a follow-up appointment approximately 2-3 weeks after your surgery.  Make sure that you call for this appointment within a day or two after you arrive home to insure a convenient appointment time. °10.OTHER INSTRUCTIONS: _________________________ °   _____________________________________ ° °WHEN TO CALL YOUR DOCTOR: °1. Fever over 101.0 °2. Inability to urinate °3. Nausea and/or vomiting °4. Extreme swelling or bruising °5. Continued bleeding from incision. °6. Increased pain, redness, or drainage from the incision ° °The clinic staff is available to answer your questions during regular business hours.  Please don’t hesitate to call and ask to speak to one of the nurses for clinical concerns.  If you have a medical emergency, go to the nearest emergency room or call 911.  A surgeon from [facility] is always on call at the hospital ° ° °  [address], [address], [place], [place]  [address] ? ° [address] [address], [place], [place]   [address] °[phone] ? [phone] ? FAX [phone] °Web site: [email or web address] °

## 2017-07-09 ENCOUNTER — Encounter (HOSPITAL_COMMUNITY): Payer: Self-pay | Admitting: General Surgery

## 2017-07-29 ENCOUNTER — Encounter (HOSPITAL_COMMUNITY): Payer: Self-pay | Admitting: General Surgery

## 2017-09-25 ENCOUNTER — Telehealth: Payer: Self-pay

## 2017-09-25 NOTE — Telephone Encounter (Signed)
Pt says he is taking the atorvastatin.Marland Kitchen K[facility]

## 2017-10-17 ENCOUNTER — Encounter: Payer: Self-pay | Admitting: Family Medicine

## 2017-10-17 ENCOUNTER — Ambulatory Visit (INDEPENDENT_AMBULATORY_CARE_PROVIDER_SITE_OTHER): Payer: Medicare HMO | Admitting: Family Medicine

## 2017-10-17 VITALS — BP 140/80 | HR 60 | Temp 97.5°F | Resp 20 | Ht 67.0 in | Wt 174.4 lb

## 2017-10-17 DIAGNOSIS — J309 Allergic rhinitis, unspecified: Secondary | ICD-10-CM | POA: Diagnosis not present

## 2017-10-17 DIAGNOSIS — R05 Cough: Secondary | ICD-10-CM

## 2017-10-17 DIAGNOSIS — R053 Chronic cough: Secondary | ICD-10-CM

## 2017-10-17 NOTE — Patient Instructions (Signed)
Try taking either Xyzal or Zyrtec or Claritin or Allegra. You can take the one with the decongestant if you stop Sudafed.   Call if you are not improving by early next week.

## 2017-10-17 NOTE — Progress Notes (Signed)
Chief Complaint  Patient presents with  . URI    x 2-3 weeks, coughing. No disolored mucus or fevers.    Subjective:  Logan Banks is a 77 y.o. male who presents for a 2-3 week history of a persistent dry cough. Symptoms are not worsening. He has been taking Sudafed which usually helps with his symptoms but no improvement. He also complains of mild nasal congestion L>R. Uses Afrin occasionally but not more than one day in a row.   Denies fever, chills, ear pain, sinus pain, headache, sore throat, chest pain, shortness of breath, wheezing, abdominal pain, N/V/D or LE edema.   He has underlying allergies and has taken an antihistamine in the past but stopped 1-2 months ago.   Denies recent antibiotic use.  No history of asthma, COPD, pneumonia.  Has had bronchitis in the past and states this does not feel like bronchitis. No chest congestion.   Former smoker.   Treatment to date: decongestants.  Denies sick contacts.  No other aggravating or relieving factors.  No other c/o.  ROS as in subjective.   Objective: Vitals:   10/17/17 0837  BP: 140/80  Pulse: 60  Resp: 20  Temp: (!) 97.5 F (36.4 C)  SpO2: 98%    General appearance: Alert, WD/WN, no distress, mildly ill appearing                             Skin: warm, no rash                           Head: no sinus tenderness                            Eyes: conjunctiva normal, corneas clear, PERRLA                            Ears: pearly TMs, external ear canals normal                          Nose: septum midline, turbinates swollen, with erythema and clear discharge             Mouth/throat: MMM, tongue normal, mild pharyngeal erythema                           Neck: supple, no adenopathy, no thyromegaly, nontender                          Heart: RRR, normal S1, S2, no murmurs                         Lungs: CTA bilaterally, no wheezes, rales, or rhonchi      Assessment: Persistent dry cough  Allergic rhinitis,  unspecified seasonality, unspecified trigger    Plan: Discussed diagnosis and treatment of dry cough that may be related to post nasal drip, allergies. No sign of an obvious infection. Will have him try an antihistamine and if he is not improving he will call next week. If he has any new or worseing symptoms we will consider an antibiotic.  Suggested symptomatic OTC remedies. Nasal saline spray for congestion.  Tylenol or Ibuprofen OTC for fever and malaise.  Call/return  in 2-3 days if symptoms aren't resolving.

## 2017-10-21 ENCOUNTER — Telehealth: Payer: Self-pay | Admitting: Family Medicine

## 2017-10-21 MED ORDER — AZITHROMYCIN 250 MG PO TABS: 250.0000 mg | ORAL_TABLET | Freq: Every day | ORAL | 0 refills | Status: DC

## 2017-10-21 NOTE — Telephone Encounter (Signed)
Patient states he is just having the same dry cough and symptoms he came in with. Zpak sent to [facility] [address]. He will come in if he is not better at day 10 after starting rx.

## 2017-10-21 NOTE — Telephone Encounter (Signed)
Please call him and find out if he is having any new symptoms or just continuing to have dry cough. Ok to send in a Z-pak for him, sig: take 2 tablets on day 1, then 1 tablet on days 2-5.  #6 no refill. Thanks.  If he is not back to baseline after day 10 of starting this, he should be seen again.

## 2017-10-21 NOTE — Telephone Encounter (Signed)
Pt is still coughing. Zyrtec is not helping with his cough at all. Per pt, Vickie mentioned that is allergy med did not help with cough, he may need Zpak or similar antibiotic.

## 2017-10-24 ENCOUNTER — Ambulatory Visit (INDEPENDENT_AMBULATORY_CARE_PROVIDER_SITE_OTHER): Payer: Medicare HMO | Admitting: Family Medicine

## 2017-10-24 ENCOUNTER — Encounter: Payer: Self-pay | Admitting: Family Medicine

## 2017-10-24 VITALS — BP 128/84 | HR 60 | Temp 97.6°F | Wt 175.2 lb

## 2017-10-24 DIAGNOSIS — K219 Gastro-esophageal reflux disease without esophagitis: Secondary | ICD-10-CM | POA: Diagnosis not present

## 2017-10-24 DIAGNOSIS — R05 Cough: Secondary | ICD-10-CM

## 2017-10-24 DIAGNOSIS — R053 Chronic cough: Secondary | ICD-10-CM

## 2017-10-24 NOTE — Progress Notes (Signed)
   Subjective:    Patient ID: Candise BowensSheldon Rodden, male    DOB: 11/20/1940, 77 y.o.   MRN: [id number]  HPI He presents with a 4-5 week history of dry persistent cough that is "not getting better or getting worse". States the cough starts as a "light tickle" when talking or taking a deep breath and describes it as "annoying". Cough is not worse at night. States he has chest pain from coughing and mild intermittent L ear pain. Denies mucous, SOB, fever, chills, headaches, n/v/d, edema in LE or wheezing. [place]. No medication changes. Patient had hernia surgery 3 months ago without complication. Former smoker.Patient was seen on 5/30 for a dry cough, and been taking Z-pack for 4 days. Pt states he tried zyrtec that has helped the allergies but not the cough.  He does rarely have reflux symptoms of acid indigestion but did not mention taking any medication.    Review of Systems     Objective:   Physical Exam Alert and in no distress. Tympanic membranes and canals are normal. Pharyngeal area is normal. Neck is supple without adenopathy or thyromegaly. Cardiac exam shows a regular sinus rhythm without murmurs or gallops. Lungs are clear to auscultation.        Assessment & Plan:  Persistent dry cough  Gastroesophageal reflux disease, esophagitis presence not specified  I do not think he has an allergy related cough or from an infection.  This could be reflux.  I will therefore have him take 40 mg of Prilosec daily for the next 2 weeks.  If he continues have difficulty, I will get an x-ray and then also referral to pulmonology.  [facility]

## 2017-10-29 ENCOUNTER — Telehealth: Payer: Self-pay | Admitting: Family Medicine

## 2017-10-29 NOTE — Telephone Encounter (Signed)
  Patient left voice mail on my phone while I was out last week  He wanted you to know that the only thing he has changed is that he has been eating a lot of watermelon lately.He searched on the internet and one of the main allergies for watermelon is excessive cough.

## 2017-10-31 ENCOUNTER — Telehealth: Payer: Self-pay | Admitting: Family Medicine

## 2017-10-31 NOTE — Telephone Encounter (Signed)
Set him up to see pulmonary for chronic cough.  Have him continue on the Prilosec until seen

## 2017-10-31 NOTE — Telephone Encounter (Signed)
Pt has taken Prilosec for 1 week now and there has not been any changes at all. Should he still continue for the 2nd week as instructed or do something different?

## 2017-11-04 ENCOUNTER — Telehealth: Payer: Self-pay

## 2017-11-04 ENCOUNTER — Other Ambulatory Visit: Payer: Self-pay

## 2017-11-04 DIAGNOSIS — R053 Chronic cough: Secondary | ICD-10-CM

## 2017-11-04 DIAGNOSIS — R05 Cough: Secondary | ICD-10-CM

## 2017-11-04 NOTE — Telephone Encounter (Signed)
Done Kh 

## 2017-11-04 NOTE — Telephone Encounter (Signed)
Called pt to  advise of his referral to pulmonology and that Dr. [name] wants him to continue the prilosec. KH

## 2017-11-07 ENCOUNTER — Telehealth: Payer: Self-pay | Admitting: Family Medicine

## 2017-11-07 DIAGNOSIS — R053 Chronic cough: Secondary | ICD-10-CM

## 2017-11-07 DIAGNOSIS — R05 Cough: Secondary | ICD-10-CM | POA: Diagnosis not present

## 2017-11-07 NOTE — Telephone Encounter (Signed)
[facility] at [facility] and chest was clear and pt wanted you to know you were right. Pt has a disk and he will drop it off to us soon.

## 2017-11-07 NOTE — Telephone Encounter (Signed)
Pt has an appt with Dr Sherene [facility]iresWert at [facility] on 7/24 at 1:30. He wants to know if we can help him get seen sooner since he is coughing.. [facility] was still waiting for an appt and this is the appt date he was given.

## 2017-11-07 NOTE — Telephone Encounter (Signed)
Pt called and would like to go get a chest xray states he is still coughing and would like to go one as soon as possible, just to see if there is something going on, pt can be reached at [phone]

## 2017-11-07 NOTE — Telephone Encounter (Signed)
Let him know that he can go by the x-ray place at any time.

## 2017-11-11 NOTE — Telephone Encounter (Signed)
Called [facility] and there is no sooner appt . But pt was advised that he could call each day to check to see if any one has canceled. KH

## 2017-11-18 ENCOUNTER — Other Ambulatory Visit: Payer: Self-pay

## 2017-11-18 ENCOUNTER — Telehealth: Payer: Self-pay | Admitting: Family Medicine

## 2017-11-18 DIAGNOSIS — N529 Male erectile dysfunction, unspecified: Secondary | ICD-10-CM

## 2017-11-18 MED ORDER — TADALAFIL 20 MG PO TABS: 20.0000 mg | ORAL_TABLET | Freq: Every day | ORAL | 5 refills | Status: DC | PRN

## 2017-11-18 NOTE — Telephone Encounter (Signed)
Patient called and asked for Cialis refill FAXED TO [facility] please place customer number [phone] on the order.  FAX [phone]

## 2017-11-18 NOTE — Telephone Encounter (Signed)
Order written

## 2017-11-18 NOTE — Telephone Encounter (Signed)
Faxed over . KH

## 2017-12-11 ENCOUNTER — Other Ambulatory Visit (INDEPENDENT_AMBULATORY_CARE_PROVIDER_SITE_OTHER): Payer: Medicare HMO

## 2017-12-11 ENCOUNTER — Encounter: Payer: Self-pay | Admitting: Internal Medicine

## 2017-12-11 ENCOUNTER — Ambulatory Visit: Payer: Medicare HMO | Admitting: Internal Medicine

## 2017-12-11 VITALS — BP 120/70 | HR 93 | Ht 68.0 in | Wt 173.0 lb

## 2017-12-11 DIAGNOSIS — R058 Other specified cough: Secondary | ICD-10-CM

## 2017-12-11 DIAGNOSIS — R05 Cough: Secondary | ICD-10-CM

## 2017-12-11 LAB — CBC WITH DIFFERENTIAL/PLATELET
BASOS PCT: 0.2 % (ref 0.0–3.0)
Basophils Absolute: 0 10*3/uL (ref 0.0–0.1)
EOS ABS: 0.1 10*3/uL (ref 0.0–0.7)
Eosinophils Relative: 0.8 % (ref 0.0–5.0)
HCT: 44.9 % (ref 39.0–52.0)
HEMOGLOBIN: 15.6 g/dL (ref 13.0–17.0)
LYMPHS ABS: 0.8 10*3/uL (ref 0.7–4.0)
Lymphocytes Relative: 8.8 % — ABNORMAL LOW (ref 12.0–46.0)
MCHC: 34.7 g/dL (ref 30.0–36.0)
MCV: 90.5 fl (ref 78.0–100.0)
MONO ABS: 0.7 10*3/uL (ref 0.1–1.0)
Monocytes Relative: 8.1 % (ref 3.0–12.0)
NEUTROS PCT: 82.1 % — AB (ref 43.0–77.0)
Neutro Abs: 7.2 10*3/uL (ref 1.4–7.7)
Platelets: 215 10*3/uL (ref 150.0–400.0)
RBC: 4.96 Mil/uL (ref 4.22–5.81)
RDW: 12.8 % (ref 11.5–15.5)
WBC: 8.7 10*3/uL (ref 4.0–10.5)

## 2017-12-11 NOTE — Patient Instructions (Signed)
For cough > delsym 2 tsp every 12 hours as needed and keep candy handy   If still coughing >   Prilosec 20 Take 30- 60 min before your first and last meals of the day and watch diet   GERD (REFLUX)  is an extremely common cause of respiratory symptoms just like yours , many times with no obvious heartburn at all.    It can be treated with medication, but also with lifestyle changes including elevation of the head of your bed (ideally with 6 inch  bed blocks),  Smoking cessation, avoidance of late meals, excessive alcohol, and avoid fatty foods, chocolate, peppermint, colas, red wine, and acidic juices such as orange juice.  NO MINT OR MENTHOL PRODUCTS SO NO COUGH DROPS   USE SUGARLESS CANDY INSTEAD (Jolley ranchers or Stover's or Life Savers) or even ice chips will also do - the key is to swallow to prevent all throat clearing. NO OIL BASED VITAMINS - use powdered substitutes.    Please remember to go to the lab department downstairs in the basement  for your tests - we will call you with the results when they are available.       If you are satisfied with your treatment plan,  let your doctor know and he/she can either refill your medications or you can return here when your prescription runs out.     If in any way you are not 100% satisfied,  please tell us.  If 100% better, tell your friends!  Pulmonary follow up is as needed

## 2017-12-11 NOTE — Progress Notes (Signed)
Logan BowensSheldon Banks, male    DOB: 03/03/1941,   MRN: [id number]    Brief patient profile:  89 yowm quit smoking 1972 with onset acute cough in April 2019 no better with zpak, prilosec, cough drops which seemed to help the most referred to pulmonary clinic 12/11/2017 by Dr  [name].    12/11/2017  Initial pulmonary eval/Larysa Pall Chief Complaint  Patient presents with  . Pulmonary Consult    Referred by Dr. Sharlot GowdaJohn Logan Banks.  Pt states started coughing in April 2019- took zpack and prilosec without any relief. He has progressively improved over the past 3 wks. He states now his cough is 99% gone.   does have problem [place] a tendency to stuffy nose, watery several times a year but esp on exp to pollen in spring s cough / wheeze and overall [place] but 2019 not so much of upper airway symptoms as a bad cough onset  in April 2019 seemed the best with cough drops/ worse p meals/better when sleeping and no sob.  Some chest discomfort with coughing in midline.  All resolved by time of ov  Does sleep 30 degrees x 6 months    No obvious day to day or daytime variability or assoc excess/ purulent sputum or mucus plugs or hemoptysis or cp or chest tightness, subjective wheeze or overt sinus or hb symptoms.    Also denies any obvious fluctuation of symptoms with weather or environmental changes or other aggravating or alleviating factors except as outlined above   No unusual exposure hx or h/o childhood pna/ asthma or knowledge of premature birth.  Current Allergies, Complete Past Medical History, Past Surgical History, Family History, and Social History were reviewed in [facility] electronic medical record.  ROS  The following are not active complaints unless bolded Hoarseness, sore throat, dysphagia, dental problems, itching, sneezing,  nasal congestion or discharge of excess mucus or purulent secretions, ear ache,   fever, chills, sweats,  unintended wt loss or wt gain, classically pleuritic or exertional cp,  orthopnea pnd or arm/hand swelling  or leg swelling, presyncope, palpitations, abdominal pain, anorexia, nausea, vomiting, diarrhea  or change in bowel habits or change in bladder habits, change in stools or change in urine, dysuria, hematuria,  rash, arthralgias, visual complaints, headache, numbness, weakness or ataxia or problems with walking or coordination,  change in mood or  Memory.                        Past Medical History:  Diagnosis Date  . BPH (benign prostatic hyperplasia)   . Dysrhythmia   . Frozen shoulder left  . Hyperlipidemia   . PONV (postoperative nausea and vomiting)     Outpatient Medications Prior to Visit  Medication Sig Dispense Refill  . acetaminophen (TYLENOL) 500 MG tablet Take 1,000 mg by mouth daily as needed for moderate pain or headache.    . ALPRAZolam (XANAX) 0.25 MG tablet Take 0.25 mg by mouth daily as needed for anxiety.    Marland Kitchen. aspirin EC 81 MG tablet Take 81 mg by mouth daily.    Marland Kitchen. atorvastatin (LIPITOR) 10 MG tablet Take 1 tablet (10 mg total) by mouth daily. 90 tablet 3  . Multiple Vitamins-Minerals (MULTIVITAMIN WITH MINERALS) tablet Take 1 tablet by mouth daily.     . Omega-3 Fatty Acids (FISH OIL) 1200 MG CAPS Take 1,200 mg by mouth daily.    .Marland Kitchen  azithromycin (ZITHROMAX Z-PAK) 250 MG tablet Take 1 tablet (250 mg total) by mouth daily. 6 each 0  . chlorhexidine (PERIDEX) 0.12 % solution 15 mLs by Mouth Rinse route 2 (two) times a week.  0  . hydrocortisone cream 1 % Apply 1 application topically daily as needed for itching.    . ondansetron (ZOFRAN) 4 MG tablet TAKE 1 TABLET (4 MG TOTAL) BY MOUTH EVERY 8 (EIGHT) HOURS AS NEEDED FOR NAUSEA OR VOMITING. (Patient not taking: Reported on 10/17/2017) 6 tablet 0  . oxymetazoline (AFRIN) 0.05 % nasal spray Place 1 spray into both nostrils daily as needed for congestion.    . tadalafil (CIALIS) 20 MG tablet Take 1 tablet (20 mg total)  by mouth daily as needed for erectile dysfunction. 30 tablet 5   No facility-administered medications prior to visit.             Objective:     BP 120/70 (BP Location: Left Arm, Cuff Size: Normal)   Pulse 93   Ht 5\' 8"  (1.727 m)   Wt 173 lb (78.5 kg)   SpO2 96%   BMI 26.30 kg/m   SpO2: 96 % RA   HEENT: nl dentition, turbinates bilaterally, and oropharynx. Nl external ear canals without cough reflex   NECK :  without JVD/Nodes/TM/ nl carotid upstrokes bilaterally   LUNGS: no acc muscle use,  Nl contour chest which is clear to A and P bilaterally without cough on insp or exp maneuvers   CV:  RRR  no s3 or murmur or increase in P2, and no edema   ABD:  soft and nontender with nl inspiratory excursion in the supine position. No bruits or organomegaly appreciated, bowel sounds nl  MS:  Nl gait/ ext warm without deformities, calf tenderness, cyanosis or clubbing No obvious joint restrictions   SKIN: warm and dry without lesions    NEURO:  alert, approp, nl sensorium with  no motor or cerebellar deficits apparent.      I personally reviewed images and agree with radiology impression as follows:  CXR:   11/07/17     Labs ordered 12/11/2017  Allergy profile       Assessment   Upper airway cough syndrome Allergy profile 12/11/2017 >  Eos 0.1 /  IgE  Pending    Pattern of recurrent nasal symptoms and then cough that improved with cough drops/ worse p meals and not waking him at night is almost certainly not asthma but rather Upper airway cough syndrome (previously labeled PNDS),  is so named because it's frequently impossible to sort out how much is  CR/sinusitis with freq throat clearing (which can be related to primary GERD)   vs  causing  secondary (" extra esophageal")  GERD from wide swings in gastric pressure that occur with throat clearing, often  promoting self use of mint and menthol lozenges that reduce the lower esophageal sphincter tone and exacerbate the  problem further in a cyclical fashion.   These are the same pts (now being labeled as having "irritable larynx syndrome" by some cough centers) who not infrequently have a history of having failed to tolerate ace inhibitors,  dry powder inhalers or biphosphonates or report having atypical/extraesophageal reflux symptoms that don't respond to standard doses of PPI  and are easily confused as having aecopd or asthma flares by even experienced allergists/ pulmonologists (myself included).    Of the three most common causes of  Sub-acute / recurrent or chronic cough, only one (GERD)  can actually contribute to/ trigger  the other two (asthma and post nasal drip syndrome)  and perpetuate the cylce of cough.  While not intuitively obvious, many patients with chronic low grade reflux do not cough until there is a primary insult that disturbs the protective epithelial barrier and exposes sensitive nerve endings.   This is typically viral but can due to PNDS and  either may apply here.      Using hard rock candy (preferably s mint/menthol) is a tried and true way to control cyclical cough and should be coupled with a gerd diet and then otc acid suppression the next time this flares then return here if not satisfied.    Total time devoted to counseling  > 50 % of initial 60 min office visit:  review case with pt/ discussion of options/alternatives/ personally creating written customized instructions  in presence of pt  then going over those specific  Instructions directly with the pt including how to use all of the meds but in particular covering each new medication in detail and the difference between the maintenance= "automatic" meds and the prns using an action plan format for the latter (If this problem/symptom => do that organization reading Left to right).  Please see AVS from this visit for a full list of these instructions which I personally wrote for this pt and  are unique to this visit.       Sandrea Hughs, MD 12/11/2017

## 2017-12-12 ENCOUNTER — Encounter: Payer: Self-pay | Admitting: Internal Medicine

## 2017-12-12 LAB — RESPIRATORY ALLERGY PROFILE REGION II ~~LOC~~
Allergen, A. alternata, m6: <0.1 kU/L
Allergen, Cedar tree, t12: <0.1 kU/L
Allergen, Comm Silver Birch, t9: <0.1 kU/L
Allergen, Mulberry, t76: <0.1 kU/L
Aspergillus fumigatus, m3: <0.1 kU/L
Bermuda Grass: <0.1 kU/L
Box Elder IgE: <0.1 kU/L
CLADOSPORIUM HERBARUM (M2) IGE: <0.1 kU/L
CLASS: 0
CLASS: 0
CLASS: 0
CLASS: 0
CLASS: 0
CLASS: 0
CLASS: 0
CLASS: 0
CLASS: 0
CLASS: 0
CLASS: 0
CLASS: 0
CLASS: 0
COMMON RAGWEED (SHORT) (W1) IGE: <0.1 kU/L
Cat Dander: <0.1 kU/L
Class: 0
Class: 0
Class: 0
Class: 0
Class: 0
Class: 0
Class: 0
Class: 0
Class: 0
Class: 0
Class: 0
D. farinae: <0.1 kU/L
Dog Dander: <0.1 kU/L
Elm IgE: <0.1 kU/L
IGE (IMMUNOGLOBULIN E), SERUM: 7 kU/L (ref <=114)
Pecan/Hickory Tree IgE: <0.1 kU/L
Rough Pigweed  IgE: <0.1 kU/L

## 2017-12-12 LAB — INTERPRETATION:

## 2017-12-12 NOTE — Assessment & Plan Note (Addendum)
Allergy profile 12/11/2017 >  Eos 0.1 /  IgE  Pending    Pattern of recurrent nasal symptoms and then cough that improved with cough drops/ worse p meals and not waking him at night is almost certainly not asthma but rather Upper airway cough syndrome (previously labeled PNDS),  is so named because it's frequently impossible to sort out how much is  CR/sinusitis with freq throat clearing (which can be related to primary GERD)   vs  causing  secondary (" extra esophageal")  GERD from wide swings in gastric pressure that occur with throat clearing, often  promoting self use of mint and menthol lozenges that reduce the lower esophageal sphincter tone and exacerbate the problem further in a cyclical fashion.   These are the same pts (now being labeled as having "irritable larynx syndrome" by some cough centers) who not infrequently have a history of having failed to tolerate ace inhibitors,  dry powder inhalers or biphosphonates or report having atypical/extraesophageal reflux symptoms that don't respond to standard doses of PPI  and are easily confused as having aecopd or asthma flares by even experienced allergists/ pulmonologists (myself included).    Of the three most common causes of  Sub-acute / recurrent or chronic cough, only one (GERD)  can actually contribute to/ trigger  the other two (asthma and post nasal drip syndrome)  and perpetuate the cylce of cough.  While not intuitively obvious, many patients with chronic low grade reflux do not cough until there is a primary insult that disturbs the protective epithelial barrier and exposes sensitive nerve endings.   This is typically viral but can due to PNDS and  either may apply here.      Using hard rock candy (preferably s mint/menthol) is a tried and true way to control cyclical cough and should be coupled with a gerd diet and then otc acid suppression the next time this flares then return here if not satisfied.    Total time devoted to  counseling  > 50 % of initial 40 min office visit:  review case with pt/ discussion of options/alternatives/ personally creating written customized instructions  in presence of pt  then going over those specific  Instructions directly with the pt including how to use all of the meds but in particular covering each new medication in detail and the difference between the maintenance= "automatic" meds and the prns using an action plan format for the latter (If this problem/symptom => do that organization reading Left to right).  Please see AVS from this visit for a full list of these instructions which I personally wrote for this pt and  are unique to this visit.

## 2017-12-13 NOTE — Progress Notes (Signed)
Spoke with pt and notified of results per Dr. Wert. Pt verbalized understanding and denied any questions. 

## 2018-01-14 ENCOUNTER — Encounter: Payer: Self-pay | Admitting: Family Medicine

## 2018-01-14 ENCOUNTER — Ambulatory Visit (INDEPENDENT_AMBULATORY_CARE_PROVIDER_SITE_OTHER): Payer: Medicare HMO | Admitting: Family Medicine

## 2018-01-14 VITALS — BP 120/76 | HR 58 | Temp 97.4°F | Wt 175.6 lb

## 2018-01-14 DIAGNOSIS — H9201 Otalgia, right ear: Secondary | ICD-10-CM

## 2018-01-14 DIAGNOSIS — D692 Other nonthrombocytopenic purpura: Secondary | ICD-10-CM | POA: Diagnosis not present

## 2018-01-14 NOTE — Progress Notes (Signed)
   Subjective:    Patient ID: Logan Banks, male    DOB: 08/18/1940, 77 y.o.   MRN: [id number]  HPI He complains of right earache when he swallows but no sore throat, fever, chills, cough or congestion.   Review of Systems     Objective:   Physical Exam Alert and in no distress. Tympanic membranes and canals are normal. Pharyngeal area is normal. Neck is supple without adenopathy or thyromegaly. Cardiac exam shows a regular sinus rhythm without murmurs or gallops. Lungs are clear to auscultation. Skin on forearms does show purpuric lesions       Assessment & Plan:  Right ear pain  Senile purpura (HCC)  I explained that he does not have any infection.  Also no other pathologic issues going on.  Recommend watchful waiting but if symptoms change, he will call me. I explained that the purpuric lesions in his forearms were of no major concern.  He was comfortable with that.

## 2018-02-26 ENCOUNTER — Other Ambulatory Visit (INDEPENDENT_AMBULATORY_CARE_PROVIDER_SITE_OTHER): Payer: Medicare HMO

## 2018-02-26 DIAGNOSIS — Z23 Encounter for immunization: Secondary | ICD-10-CM | POA: Diagnosis not present

## 2018-05-30 DIAGNOSIS — H524 Presbyopia: Secondary | ICD-10-CM | POA: Diagnosis not present

## 2018-05-30 DIAGNOSIS — H52203 Unspecified astigmatism, bilateral: Secondary | ICD-10-CM | POA: Diagnosis not present

## 2018-06-12 ENCOUNTER — Encounter: Payer: Self-pay | Admitting: Family Medicine

## 2018-06-12 ENCOUNTER — Ambulatory Visit (INDEPENDENT_AMBULATORY_CARE_PROVIDER_SITE_OTHER): Payer: Medicare HMO | Admitting: Family Medicine

## 2018-06-12 VITALS — BP 138/86 | HR 88 | Temp 97.9°F | Wt 175.0 lb

## 2018-06-12 DIAGNOSIS — M25551 Pain in right hip: Secondary | ICD-10-CM | POA: Diagnosis not present

## 2018-06-12 NOTE — Patient Instructions (Signed)
Use CBD oil since this  Works.  If this keeps bothering you, come back for further evaluation.

## 2018-06-12 NOTE — Progress Notes (Signed)
   Subjective:    Patient ID: Logan Banks, male    DOB: 23-Sep-1940, 78 y.o.   MRN: [id number]  HPI He complains of a one-week history of right hip pain.  No history of injury or overuse.  No numbness, tingling or weakness.  He does point to the right lateral hip and upper thigh area.  No back pain.  He has tried over-the-counter medications with minimal relief including tramadol.  He did try CBD oil which apparently did help.   Review of Systems     Objective:   Physical Exam Alert and in no distress.  No palpable tenderness to his SI joint, greater trochanter.  Normal lumbar curve with good motion.  Normal hip motion.  Negative straight leg raising and normal DTRs.      Assessment & Plan:  Right hip pain I explained that since I did not find an underlying cause for this, treating this conservatively is appropriate.  He will continue on CBD but if he continues have difficulty he is to return here for further evaluation.

## 2018-06-13 ENCOUNTER — Ambulatory Visit: Payer: Medicare HMO | Admitting: Family Medicine

## 2018-06-18 DIAGNOSIS — M5126 Other intervertebral disc displacement, lumbar region: Secondary | ICD-10-CM | POA: Diagnosis not present

## 2018-06-25 DIAGNOSIS — M5126 Other intervertebral disc displacement, lumbar region: Secondary | ICD-10-CM | POA: Diagnosis not present

## 2018-07-09 DIAGNOSIS — M5126 Other intervertebral disc displacement, lumbar region: Secondary | ICD-10-CM | POA: Diagnosis not present

## 2018-07-16 DIAGNOSIS — M5126 Other intervertebral disc displacement, lumbar region: Secondary | ICD-10-CM | POA: Diagnosis not present

## 2018-07-25 ENCOUNTER — Ambulatory Visit (INDEPENDENT_AMBULATORY_CARE_PROVIDER_SITE_OTHER): Payer: Medicare HMO | Admitting: Family Medicine

## 2018-07-25 ENCOUNTER — Encounter: Payer: Self-pay | Admitting: Family Medicine

## 2018-07-25 VITALS — BP 140/88 | HR 70 | Temp 97.6°F | Ht 67.0 in | Wt 176.0 lb

## 2018-07-25 DIAGNOSIS — J301 Allergic rhinitis due to pollen: Secondary | ICD-10-CM | POA: Diagnosis not present

## 2018-07-25 DIAGNOSIS — E785 Hyperlipidemia, unspecified: Secondary | ICD-10-CM

## 2018-07-25 DIAGNOSIS — N400 Enlarged prostate without lower urinary tract symptoms: Secondary | ICD-10-CM | POA: Diagnosis not present

## 2018-07-25 DIAGNOSIS — Z8249 Family history of ischemic heart disease and other diseases of the circulatory system: Secondary | ICD-10-CM

## 2018-07-25 DIAGNOSIS — Z0000 Encounter for general adult medical examination without abnormal findings: Secondary | ICD-10-CM | POA: Diagnosis not present

## 2018-07-25 DIAGNOSIS — R058 Other specified cough: Secondary | ICD-10-CM

## 2018-07-25 DIAGNOSIS — D692 Other nonthrombocytopenic purpura: Secondary | ICD-10-CM | POA: Diagnosis not present

## 2018-07-25 DIAGNOSIS — R05 Cough: Secondary | ICD-10-CM | POA: Diagnosis not present

## 2018-07-25 LAB — POCT URINALYSIS DIP (PROADVANTAGE DEVICE)
BILIRUBIN UA: NEGATIVE
BILIRUBIN UA: NEGATIVE mg/dL
Blood, UA: NEGATIVE
Glucose, UA: NEGATIVE mg/dL
LEUKOCYTES UA: NEGATIVE
Nitrite, UA: NEGATIVE
PROTEIN UA: NEGATIVE mg/dL
Specific Gravity, Urine: 1.02
Urobilinogen, Ur: 3.5
pH, UA: 6 (ref 5.0–8.0)

## 2018-07-25 MED ORDER — ATORVASTATIN CALCIUM 10 MG PO TABS: 10.0000 mg | ORAL_TABLET | Freq: Every day | ORAL | 3 refills | Status: DC

## 2018-07-25 NOTE — Addendum Note (Signed)
Addended by: Renelda Loma on: 07/25/2018 04:38 PM   Modules accepted: Orders

## 2018-07-25 NOTE — Patient Instructions (Signed)
  Mr. Penning , Thank you for taking time to come for your Medicare Wellness Visit. I appreciate your ongoing commitment to your health goals. Please review the following plan we discussed and let me know if I can assist you in the future.   These are the goals we discussed: Goals   None     This is a list of the screening recommended for you and due dates:  Health Maintenance  Topic Date Due  . Tetanus Vaccine  08/29/2020  . Colon Cancer Screening  07/10/2021  . Flu Shot  Completed  . Pneumonia vaccines  Completed

## 2018-07-25 NOTE — Progress Notes (Signed)
Logan Banks is a 78 y.o. male who presents for annual wellness visit,CPE and follow-up on chronic medical conditions.  He has no particular concerns or complaints.  Does have hyperlipidemia and is doing well on atorvastatin.  Does have a family history of heart disease.  He also has underlying allergies that are not causing him any trouble at the present time.  He does have BPH but is having no trouble with that.  He has been seen in the past by pulmonary and diagnosed to have upper airway cough syndrome.  Family and social history was reviewed. He does have a history of senile purpura  Immunizations and Health Maintenance Immunization History  Administered Date(s) Administered  . Influenza Split 02/13/2013  . Influenza, High Dose Seasonal PF 02/15/2014, 01/26/2015, 01/12/2016, 02/21/2017, 02/26/2018  . Pneumococcal Conjugate-13 12/03/2013  . Pneumococcal Polysaccharide-23 12/15/2012  . Tdap 08/30/2010  . Zoster 12/07/2013   There are no preventive care reminders to display for this patient.  Last colonoscopy:07-10-16 Last PSA:12-15-2012 Dentist: q four months Ophtho: winter 2019 Exercise: qd walking  Total gym   Other doctors caring for patient include: Dr.Wert pulmonogy,  Dr. Kem Parkinson., Dr Kinnie Scales GI  Advanced Directives:yes copy in chart Does Patient Have a Medical Advance Directive?: Yes Type of Advance Directive: Healthcare Power of Attorney, Living will Does patient want to make changes to medical advance directive?: No - Patient declined Copy of Healthcare Power of Attorney in Chart?: Yes - validated most recent copy scanned in chart (See row information)  Depression screen:  See questionnaire below.     Depression screen [facility] 2/9 07/25/2018 04/17/2017 01/09/2017 12/13/2015 12/01/2014  Decreased Interest 0 0 0 0 0  Down, Depressed, Hopeless 0 0 0 0 0  PHQ - 2 Score 0 0 0 0 0    Fall Screen: See Questionaire below.   Fall Risk  07/25/2018 04/17/2017 01/09/2017 12/13/2015  12/01/2014  Falls in the past year? 0 No No No No    ADL screen:  See questionnaire below.  Functional Status Survey: Is the patient deaf or have difficulty hearing?: No Does the patient have difficulty seeing, even when wearing glasses/contacts?: No Does the patient have difficulty concentrating, remembering, or making decisions?: No Does the patient have difficulty walking or climbing stairs?: No Does the patient have difficulty dressing or bathing?: No Does the patient have difficulty doing errands alone such as visiting a doctor's office or shopping?: No   Review of Systems  Constitutional: -, -unexpected weight change, -anorexia, -fatigue Allergy: -sneezing, -itching, -congestion Dermatology: denies changing moles, rash, lumps ENT: -runny nose, -ear pain, -sore throat,  Cardiology:  -chest pain, -palpitations, -orthopnea, Respiratory: -cough, -shortness of breath, -dyspnea on exertion, -wheezing,  Gastroenterology: -abdominal pain, -nausea, -vomiting, -diarrhea, -constipation, -dysphagia Hematology: -bleeding or bruising problems Musculoskeletal: -arthralgias, -myalgias, -joint swelling, -back pain, - Ophthalmology: -vision changes,  Urology: -dysuria, -difficulty urinating,  -urinary frequency, -urgency, incontinence Neurology: -, -numbness, , -memory loss, -falls, -dizziness    PHYSICAL EXAM:  BP 140/88 (BP Location: Left Arm, Patient Position: Sitting)   Pulse 70   Temp 97.6 F (36.4 C)   Ht 5\' 7"  (1.702 m)   Wt 176 lb (79.8 kg)   SpO2 95%   BMI 27.57 kg/m   General Appearance: Alert, cooperative, no distress, appears stated age Head: Normocephalic, without obvious abnormality, atraumatic Eyes: PERRL, conjunctiva/corneas clear, EOM's intact, fundi benign Ears: Normal TM's and external ear canals Nose: Nares normal, mucosa normal, no drainage or sinus   tenderness Throat:  Lips, mucosa, and tongue normal; teeth and gums normal Neck: Supple, no lymphadenopathy,  thyroid:no enlargement/tenderness/nodules; no carotid bruit or JVD Lungs: Clear to auscultation bilaterally without wheezes, rales or ronchi; respirations unlabored Heart: Regular rate and rhythm, S1 and S2 normal, no murmur, rub or gallop Abdomen: Soft, non-tender, nondistended, normoactive bowel sounds, no masses, no hepatosplenomegaly Extremities: No clubbing, cyanosis or edema Pulses: 2+ and symmetric all extremities Skin: Skin color, texture, turgor normal, no purpuric lesions were noted today Lymph nodes: Cervical, supraclavicular, and axillary nodes normal Neurologic: CNII-XII intact, normal strength, sensation and gait; reflexes 2+ and symmetric throughout   Psych: Normal mood, affect, hygiene and grooming  ASSESSMENT/PLAN: Family history of heart disease in male family member before age 8 - Plan: CBC with Differential/Platelet  Benign prostatic hyperplasia, unspecified whether lower urinary tract symptoms present  Seasonal allergic rhinitis due to pollen  Hyperlipidemia, unspecified hyperlipidemia type - Plan: Lipid panel  Senile purpura (HCC)  Upper airway cough syndrome  Routine general medical examination at a health care facility - Plan: CBC with Differential/Platelet, Comprehensive metabolic panel, Lipid panel   recommended at least 30 minutes of aerobic activity at least 5 days/week;  healthy diet and alcohol recommendations (less than or equal to 2 drinks/day) reviewed;  Immunization recommendations discussed.  Colonoscopy recommendations reviewed.   Medicare Attestation I have personally reviewed: The patient's medical and social history Their use of alcohol, tobacco or illicit drugs Their current medications and supplements The patient's functional ability including ADLs,fall risks, home safety risks, cognitive, and hearing and visual impairment Diet and physical activities Evidence for depression or mood disorders  The patient's weight, height, and BMI have  been recorded in the chart.  I have made referrals, counseling, and provided education to the patient based on review of the above and I have provided the patient with a written personalized care plan for preventive services.     Sharlot Gowda, MD   07/25/2018

## 2018-07-26 LAB — LIPID PANEL
CHOL/HDL RATIO: 3 ratio (ref 0.0–5.0)
Cholesterol, Total: 140 mg/dL (ref 100–199)
HDL: 47 mg/dL (ref >39)
LDL Calculated: 78 mg/dL (ref 0–99)
Triglycerides: 74 mg/dL (ref 0–149)
VLDL CHOLESTEROL CAL: 15 mg/dL (ref 5–40)

## 2018-07-26 LAB — COMPREHENSIVE METABOLIC PANEL
ALT: 23 IU/L (ref 0–44)
AST: 23 IU/L (ref 0–40)
Albumin/Globulin Ratio: 1.7 (ref 1.2–2.2)
Albumin: 4.3 g/dL (ref 3.7–4.7)
Alkaline Phosphatase: 63 IU/L (ref 39–117)
BUN/Creatinine Ratio: 15 (ref 10–24)
BUN: 16 mg/dL (ref 8–27)
Bilirubin Total: 0.6 mg/dL (ref 0.0–1.2)
CALCIUM: 9.4 mg/dL (ref 8.6–10.2)
CO2: 23 mmol/L (ref 20–29)
CREATININE: 1.05 mg/dL (ref 0.76–1.27)
Chloride: 102 mmol/L (ref 96–106)
GFR, EST AFRICAN AMERICAN: 79 mL/min/{1.73_m2} (ref >59)
GFR, EST NON AFRICAN AMERICAN: 69 mL/min/{1.73_m2} (ref >59)
GLUCOSE: 86 mg/dL (ref 65–99)
Globulin, Total: 2.6 g/dL (ref 1.5–4.5)
Potassium: 4.2 mmol/L (ref 3.5–5.2)
Sodium: 141 mmol/L (ref 134–144)
Total Protein: 6.9 g/dL (ref 6.0–8.5)

## 2018-07-26 LAB — CBC WITH DIFFERENTIAL/PLATELET
Basophils Absolute: 0 10*3/uL (ref 0.0–0.2)
Basos: 1 %
EOS (ABSOLUTE): 0.1 10*3/uL (ref 0.0–0.4)
EOS: 1 %
Hematocrit: 46.5 % (ref 37.5–51.0)
Hemoglobin: 16 g/dL (ref 13.0–17.7)
Immature Grans (Abs): 0 10*3/uL (ref 0.0–0.1)
Immature Granulocytes: 0 %
LYMPHS ABS: 1.1 10*3/uL (ref 0.7–3.1)
Lymphs: 17 %
MCH: 31.2 pg (ref 26.6–33.0)
MCHC: 34.4 g/dL (ref 31.5–35.7)
MCV: 91 fL (ref 79–97)
MONOCYTES: 8 %
Monocytes Absolute: 0.6 10*3/uL (ref 0.1–0.9)
NEUTROS ABS: 4.8 10*3/uL (ref 1.4–7.0)
Neutrophils: 73 %
PLATELETS: 224 10*3/uL (ref 150–450)
RBC: 5.13 x10E6/uL (ref 4.14–5.80)
RDW: 12 % (ref 11.6–15.4)
WBC: 6.7 10*3/uL (ref 3.4–10.8)

## 2018-07-30 DIAGNOSIS — M5126 Other intervertebral disc displacement, lumbar region: Secondary | ICD-10-CM | POA: Diagnosis not present

## 2018-09-01 ENCOUNTER — Encounter: Payer: Self-pay | Admitting: Family Medicine

## 2018-09-01 ENCOUNTER — Ambulatory Visit (INDEPENDENT_AMBULATORY_CARE_PROVIDER_SITE_OTHER): Payer: Medicare HMO | Admitting: Family Medicine

## 2018-09-01 ENCOUNTER — Other Ambulatory Visit: Payer: Self-pay

## 2018-09-01 VITALS — Temp 97.0°F | Wt 172.0 lb

## 2018-09-01 DIAGNOSIS — R0789 Other chest pain: Secondary | ICD-10-CM

## 2018-09-01 NOTE — Progress Notes (Signed)
   Subjective:    Patient ID: Logan Banks, male    DOB: 13-Oct-1940, 78 y.o.   MRN: [id number]  HPI Documentation for virtual telephone encounter.  Documentation for virtual audio and video telecommunications through WebEx encounter:  The patient was located at home. The provider was located in the office. The patient did consent to this visit and is aware of possible charges through their insurance for this visit. The other persons participating in this telemedicine service were none. This virtual service is not related to other E/M service within previous 7 days. He has a 1 year history of upper chest discomfort however in the last 2 weeks he has noted increased difficulty with upper chest discomfort.  He also started an exercise program with doing deltoid and pectoral exercises.  The pain is worse if he coughs or stretches but if he breathes normally he has no pain.  He has had no shortness of breath, PND or to DOE. He states that Tylenol is not helping.   Review of Systems     Objective:   Physical Exam Alert and in no distress .  He was observed moving his arms without difficulty.       Assessment & Plan:  Chest wall pain Recommend he continue with his physical activity level and use Advil or Aleve.  He will call if further trouble.

## 2018-12-03 ENCOUNTER — Encounter: Payer: Self-pay | Admitting: Family Medicine

## 2018-12-03 ENCOUNTER — Ambulatory Visit (INDEPENDENT_AMBULATORY_CARE_PROVIDER_SITE_OTHER): Payer: Medicare HMO | Admitting: Family Medicine

## 2018-12-03 ENCOUNTER — Other Ambulatory Visit: Payer: Self-pay

## 2018-12-03 VITALS — BP 120/82 | HR 72 | Temp 98.1°F | Wt 176.8 lb

## 2018-12-03 DIAGNOSIS — L989 Disorder of the skin and subcutaneous tissue, unspecified: Secondary | ICD-10-CM | POA: Diagnosis not present

## 2018-12-03 NOTE — Progress Notes (Signed)
   Subjective:    Patient ID: Logan Banks, male    DOB: 06-03-1940, 78 y.o.   MRN: [id number]  HPI He is here for evaluation of some lesions on his mid back area that he states sometimes cause some discomfort.  He notes this especially when he leans back in a chair.   Review of Systems     Objective:   Physical Exam Alert and in no distress.  Exam of his back shows no red flag lesions.  He has 2 pigmented lesions with well-demarcated borders.  No tenderness palpation over his back in the area that he describes.       Assessment & Plan:   Encounter Diagnosis  Name Primary?  . Skin lesion Yes  I explained that the lesions on his back are benign and nothing to worry about.  He was comfortable with that.

## 2019-01-01 ENCOUNTER — Encounter: Payer: Self-pay | Admitting: Family Medicine

## 2019-01-01 ENCOUNTER — Ambulatory Visit (INDEPENDENT_AMBULATORY_CARE_PROVIDER_SITE_OTHER): Payer: Medicare HMO | Admitting: Family Medicine

## 2019-01-01 ENCOUNTER — Other Ambulatory Visit: Payer: Self-pay

## 2019-01-01 VITALS — BP 112/70 | HR 86 | Temp 98.9°F | Wt 178.0 lb

## 2019-01-01 DIAGNOSIS — M79622 Pain in left upper arm: Secondary | ICD-10-CM | POA: Diagnosis not present

## 2019-01-01 DIAGNOSIS — Z23 Encounter for immunization: Secondary | ICD-10-CM

## 2019-01-01 NOTE — Progress Notes (Signed)
   Acute Office Visit  Subjective:    Patient ID: Logan Banks, male    DOB: Jul 19, 1940, 78 y.o.   MRN: [id number]  Chief Complaint  Patient presents with  . othe    lymph nodes under left arm swollen    HPI   Patient is in today for soreness in the left axilla that began a week ago. Patient states that there is soreness on palpation or when lowering arms. No itching or burning. Patient did not feel any masses. No recent bites or injury to his arm. No fever, sneezing or coughing. Patient has been feeling congested for which he has been taking sudafed. He had a similar episode in the same location six months ago that resolved after a few days. He had taken sudafed and tylenol around the same time for unrelated reasons.      Objective:    Physical Exam   Examination of left and right axilla show show fat , also no edema in the left axilla. No erythema, lesions, or palpable masses or tenderness.  Full motion of the arm.  BP 112/70 (BP Location: Left Arm, Patient Position: Sitting)   Pulse 86   Temp 98.9 F (37.2 C)   Wt 178 lb (80.7 kg)   SpO2 97%   BMI 27.88 kg/m  Wt Readings from Last 3 Encounters:  01/01/19 178 lb (80.7 kg)  12/03/18 176 lb 12.8 oz (80.2 kg)  09/01/18 172 lb (78 kg)      Assessment & Plan:   Encounter Diagnosis  Name Primary?  Marland Kitchen Axillary pain, left Yes    Left Axilla Soreness - Reassured patient that his symptoms and examination were non-concerning and to watch for any new symptoms and contact us if condition worsens.    Ozella Almond, Medical Student

## 2019-01-05 ENCOUNTER — Ambulatory Visit: Payer: Medicare HMO | Admitting: Family Medicine

## 2019-01-13 NOTE — Addendum Note (Signed)
Addended by: Minette Headland A on: 01/13/2019 04:00 PM   Modules accepted: Orders

## 2019-02-06 ENCOUNTER — Telehealth: Payer: Self-pay | Admitting: Family Medicine

## 2019-02-06 MED ORDER — ALBUTEROL SULFATE HFA 108 (90 BASE) MCG/ACT IN AERS: 2.0000 | INHALATION_SPRAY | Freq: Four times a day (QID) | RESPIRATORY_TRACT | 0 refills | Status: DC | PRN

## 2019-02-06 NOTE — Telephone Encounter (Signed)
Pt called and states that he is needing a refill on a inhaler that was prescribed to him last year but he does not know the name of it, he would like to have it pt said he would do a visit if needed, pt uses [id number] - [place], [place]

## 2019-02-12 DIAGNOSIS — R69 Illness, unspecified: Secondary | ICD-10-CM | POA: Diagnosis not present

## 2019-02-24 ENCOUNTER — Other Ambulatory Visit: Payer: Self-pay | Admitting: Family Medicine

## 2019-02-24 NOTE — Telephone Encounter (Signed)
[facility] to [facility] which looks like it was fill 2 weeks ago. Please advise [facility]

## 2019-03-11 DIAGNOSIS — R69 Illness, unspecified: Secondary | ICD-10-CM | POA: Diagnosis not present

## 2019-04-24 ENCOUNTER — Other Ambulatory Visit: Payer: Self-pay

## 2019-04-24 ENCOUNTER — Encounter: Payer: Self-pay | Admitting: Family Medicine

## 2019-04-24 ENCOUNTER — Ambulatory Visit (INDEPENDENT_AMBULATORY_CARE_PROVIDER_SITE_OTHER): Payer: Medicare HMO | Admitting: Family Medicine

## 2019-04-24 VITALS — HR 96 | Temp 97.1°F | Wt 171.0 lb

## 2019-04-24 DIAGNOSIS — H1032 Unspecified acute conjunctivitis, left eye: Secondary | ICD-10-CM | POA: Diagnosis not present

## 2019-04-24 DIAGNOSIS — H5789 Other specified disorders of eye and adnexa: Secondary | ICD-10-CM | POA: Diagnosis not present

## 2019-04-24 DIAGNOSIS — J309 Allergic rhinitis, unspecified: Secondary | ICD-10-CM

## 2019-04-24 MED ORDER — POLYMYXIN B-TRIMETHOPRIM 10000-0.1 UNIT/ML-% OP SOLN: 1.0000 [drp] | OPHTHALMIC | 0 refills | Status: AC

## 2019-04-24 NOTE — Progress Notes (Signed)
   Subjective:  Documentation for virtual audio and video telecommunications through [place] encounter:  The patient was located at home. 2 patient identifiers used.  The provider was located in the office. The patient did consent to this visit and is aware of possible charges through their insurance for this visit.  The other persons participating in this telemedicine service were none.    Patient ID: Logan Banks, male    DOB: [birth year], 78 y.o.   MRN: [id number]  HPI Chief Complaint  Patient presents with  . left red eye    left eye red, put eye drops- otc soothe, itchying, slight runny nose. not sure if it allergies    Complains of a 2 day history of left eye redness and itching.  No injury.  He also reports having his usual rhinorrhea and nasal congestion associated with allergies. No drainage from his eye.  Reports some mild swelling of the left lower eyelid.  He used an OTC eyedrop "soothe" for hydration. It did help some.  No contact lenses.   No foreign body sensation, no eye pain or vision changes.  Denies fever, chills, headache, sinus pain, sore throat, cough, chest pain, shortness of breath, nausea, vomiting. No loss of taste or smell. Denies COVID-19 exposure  Reviewed allergies, medications, past medical, surgical, family, and social history.    Review of Systems Pertinent positives and negatives in the history of present illness.     Objective:   Physical Exam Pulse 96   Temp (!) 97.1 F (36.2 C)   Wt 171 lb (77.6 kg)   BMI 26.78 kg/m   Alert and oriented and in no acute distress.  Left eye lower lid appears edematous, conjunctiva is red, no drainage.  EOMs intact and no pain per patient.  No sinus tenderness per patient.  Respirations unlabored.  Normal speech.      Assessment & Plan:  Acute bacterial conjunctivitis of left eye - Plan: trimethoprim-polymyxin b (POLYTRIM) ophthalmic solution  Red eye  Allergic rhinitis, unspecified  seasonality, unspecified trigger  Discussed limitations of a virtual visit. No red flag symptoms. I will treat him for bacterial conjunctivitis since this is unilateral.  He will continue treating his underlying allergies.  May use warm compresses.  Follow-up if he notices any new or worsening symptoms.  Time spent on call was 12 minutes and in review of previous records 15 minutes total.  This virtual service is not related to other E/M service within previous 7 days.

## 2019-06-12 ENCOUNTER — Ambulatory Visit: Payer: Medicare HMO | Attending: Internal Medicine

## 2019-06-12 DIAGNOSIS — Z23 Encounter for immunization: Secondary | ICD-10-CM | POA: Insufficient documentation

## 2019-06-12 NOTE — Progress Notes (Signed)
   Covid-19 Vaccination Clinic  Name:  Logan Banks    MRN: [id number] DOB: Nov 03, 1940  06/12/2019  Logan Banks was observed post Covid-19 immunization for 15 minutes without incidence. He was provided with Vaccine Information Sheet and instruction to access the V-Safe system.   Logan Banks was instructed to call 911 with any severe reactions post vaccine: Marland Kitchen Difficulty breathing  . Swelling of your face and throat  . A fast heartbeat  . A bad rash all over your body  . Dizziness and weakness    Immunizations Administered    Name Date Dose VIS Date Route   Pfizer COVID-19 Vaccine 06/12/2019 11:37 AM 0.3 mL 05/01/2019 Intramuscular   Manufacturer: [employer], [employer]   Lot: [id number]   NDC: [address]-1

## 2019-06-16 DIAGNOSIS — H524 Presbyopia: Secondary | ICD-10-CM | POA: Diagnosis not present

## 2019-06-16 DIAGNOSIS — H5212 Myopia, left eye: Secondary | ICD-10-CM | POA: Diagnosis not present

## 2019-07-03 ENCOUNTER — Ambulatory Visit: Payer: Medicare HMO | Attending: Internal Medicine

## 2019-07-03 DIAGNOSIS — Z23 Encounter for immunization: Secondary | ICD-10-CM | POA: Insufficient documentation

## 2019-07-03 NOTE — Progress Notes (Signed)
   Covid-19 Vaccination Clinic  Name:  Logan Banks    MRN: [id number] DOB: 05/15/1941  07/03/2019  [name] was observed post Covid-19 immunization for 15 minutes without incidence. He was provided with Vaccine Information Sheet and instruction to access the V-Safe system.   [name] was instructed to call 911 with any severe reactions post vaccine: Marland Kitchen Difficulty breathing  . Swelling of your face and throat  . A fast heartbeat  . A bad rash all over your body  . Dizziness and weakness    Immunizations Administered    Name Date Dose VIS Date Route   Pfizer COVID-19 Vaccine 07/03/2019  8:36 AM 0.3 mL 05/01/2019 Intramuscular   Manufacturer: [employer], [employer]   Lot: [id number]   NDC: [address]-1

## 2019-07-15 ENCOUNTER — Other Ambulatory Visit: Payer: Self-pay | Admitting: Family Medicine

## 2019-07-15 DIAGNOSIS — E785 Hyperlipidemia, unspecified: Secondary | ICD-10-CM

## 2019-07-29 ENCOUNTER — Ambulatory Visit (INDEPENDENT_AMBULATORY_CARE_PROVIDER_SITE_OTHER): Payer: Medicare HMO | Admitting: Family Medicine

## 2019-07-29 ENCOUNTER — Other Ambulatory Visit: Payer: Self-pay

## 2019-07-29 ENCOUNTER — Encounter: Payer: Self-pay | Admitting: Family Medicine

## 2019-07-29 VITALS — BP 124/76 | HR 88 | Temp 98.7°F | Ht 67.0 in | Wt 178.6 lb

## 2019-07-29 DIAGNOSIS — R05 Cough: Secondary | ICD-10-CM

## 2019-07-29 DIAGNOSIS — R058 Other specified cough: Secondary | ICD-10-CM

## 2019-07-29 DIAGNOSIS — J301 Allergic rhinitis due to pollen: Secondary | ICD-10-CM

## 2019-07-29 DIAGNOSIS — N529 Male erectile dysfunction, unspecified: Secondary | ICD-10-CM

## 2019-07-29 DIAGNOSIS — Z8249 Family history of ischemic heart disease and other diseases of the circulatory system: Secondary | ICD-10-CM

## 2019-07-29 DIAGNOSIS — D692 Other nonthrombocytopenic purpura: Secondary | ICD-10-CM | POA: Diagnosis not present

## 2019-07-29 DIAGNOSIS — Z0000 Encounter for general adult medical examination without abnormal findings: Secondary | ICD-10-CM | POA: Diagnosis not present

## 2019-07-29 DIAGNOSIS — N400 Enlarged prostate without lower urinary tract symptoms: Secondary | ICD-10-CM | POA: Diagnosis not present

## 2019-07-29 DIAGNOSIS — E785 Hyperlipidemia, unspecified: Secondary | ICD-10-CM | POA: Diagnosis not present

## 2019-07-29 MED ORDER — TADALAFIL 20 MG PO TABS: 20.0000 mg | ORAL_TABLET | Freq: Every day | ORAL | 5 refills | Status: DC | PRN

## 2019-07-29 MED ORDER — ATORVASTATIN CALCIUM 10 MG PO TABS: 10.0000 mg | ORAL_TABLET | Freq: Every day | ORAL | 3 refills | Status: DC

## 2019-07-29 NOTE — Progress Notes (Signed)
Logan Banks is a 79 y.o. male who presents for annual wellness visit,CPE and follow-up on chronic medical conditions.  He has no particular concerns or complaints.  He continues on atorvastatin and is having no difficulty with that.  He would also like a refill on his Cialis.  He finds this useful.  He does have a history of BPH but is having no difficulty with that.  His allergies seem to be under good control.  He really is not having any difficulty with his cough.  He continues on multiple OTC medications.  Otherwise his social and family history are unremarkable.  He is working part-time.  He also paints for fun.  He did show me some of his art work.   Immunizations and Health Maintenance Immunization History  Administered Date(s) Administered  . Fluad Quad(high Dose 65+) 01/01/2019  . Influenza Split 02/13/2013  . Influenza, High Dose Seasonal PF 02/15/2014, 01/26/2015, 01/12/2016, 02/21/2017, 02/26/2018  . PFIZER SARS-COV-2 Vaccination 06/12/2019, 07/03/2019  . Pneumococcal Conjugate-13 12/03/2013  . Pneumococcal Polysaccharide-23 12/15/2012  . Tdap 08/30/2010  . Zoster 12/07/2013  . Zoster Recombinat (Shingrix) 07/30/2018   There are no preventive care reminders to display for this patient.  Last colonoscopy:07/10/2016 Last PSA: 12/15/12 Dentist: q four months Ophtho: once a year  Exercise: QD   Other doctors caring for patient include: Dr. Mollie Germany, Dr. Kinnie Scales GI  Advanced Directives: On file Does Patient Have a Medical Advance Directive?: Yes Type of Advance Directive: Healthcare Power of Attorney, Living will Does patient want to make changes to medical advance directive?: No - Patient declined Copy of Healthcare Power of Attorney in Chart?: Yes - validated most recent copy scanned in chart (See row information)  Depression screen:  See questionnaire below.     Depression screen [facility] 2/9 07/29/2019 07/25/2018 04/17/2017 01/09/2017 12/13/2015  Decreased Interest 0 0 0 0 0   Down, Depressed, Hopeless 0 0 0 0 0  PHQ - 2 Score 0 0 0 0 0    Fall Screen: See Questionaire below.   Fall Risk  07/29/2019 07/25/2018 04/17/2017 01/09/2017 12/13/2015  Falls in the past year? 0 0 No No No    ADL screen:  See questionnaire below.  Functional Status Survey: Is the patient deaf or have difficulty hearing?: No Does the patient have difficulty seeing, even when wearing glasses/contacts?: No Does the patient have difficulty concentrating, remembering, or making decisions?: No Does the patient have difficulty walking or climbing stairs?: No Does the patient have difficulty dressing or bathing?: No Does the patient have difficulty doing errands alone such as visiting a doctor's office or shopping?: No   Review of Systems  Constitutional: -, -unexpected weight change, -anorexia, -fatigue Allergy: -sneezing, -itching, -congestion Dermatology: denies changing moles, rash, lumps ENT: -runny nose, -ear pain, -sore throat,  Cardiology:  -chest pain, -palpitations, -orthopnea, Respiratory: -cough, -shortness of breath, -dyspnea on exertion, -wheezing,  Gastroenterology: -abdominal pain, -nausea, -vomiting, -diarrhea, -constipation, -dysphagia Hematology: -bleeding or bruising problems Musculoskeletal: -arthralgias, -myalgias, -joint swelling, -back pain, - Ophthalmology: -vision changes,  Urology: -dysuria, -difficulty urinating,  -urinary frequency, -urgency, incontinence Neurology: -, -numbness, , -memory loss, -falls, -dizziness    PHYSICAL EXAM:   General Appearance: Alert, cooperative, no distress, appears stated age Head: Normocephalic, without obvious abnormality, atraumatic Eyes: PERRL, conjunctiva/corneas clear, EOM's intact, Ears: Normal TM's and external ear canals Nose: Nares normal, mucosa normal, no drainage or sinus   tenderness Throat: Lips, mucosa, and tongue normal; teeth and gums normal Neck: Supple, no  lymphadenopathy, thyroid:no  enlargement/tenderness/nodules; no carotid bruit or JVD Lungs: Clear to auscultation bilaterally without wheezes, rales or ronchi; respirations unlabored Heart: Regular rate and rhythm, S1 and S2 normal, no murmur, rub or gallop Abdomen: Soft, non-tender, nondistended, normoactive bowel sounds, no masses, no hepatosplenomegaly Skin: Skin color, texture, turgor normal, purpuric lesions noted on arms.  Lymph nodes: Cervical, supraclavicular, and axillary nodes normal Neurologic: CNII-XII intact, normal strength, sensation and gait; reflexes 2+ and symmetric throughout   Psych: Normal mood, affect, hygiene and grooming  ASSESSMENT/PLAN: Erectile dysfunction, unspecified erectile dysfunction type  Hyperlipidemia, unspecified hyperlipidemia type  Senile purpura ([place])  Family history of heart disease in male family member before age 32  Seasonal allergic rhinitis due to pollen  Benign prostatic hyperplasia, unspecified whether lower urinary tract symptoms present  Upper airway cough syndrome  Routine general medical examination at a health care facility   healthy diet and alcohol recommendations (less than or equal to 2 drinks/day) reviewed; Marland Kitchen Immunization recommendations discussed.  Colonoscopy recommendations reviewed.  At this point he is past the point of needing a colonoscopy.   Medicare Attestation I have personally reviewed: The patient's medical and social history Their use of alcohol, tobacco or illicit drugs Their current medications and supplements The patient's functional ability including ADLs,fall risks, home safety risks, cognitive, and hearing and visual impairment Diet and physical activities Evidence for depression or mood disorders  The patient's weight, height, and BMI have been recorded in the chart.  I have made referrals, counseling, and provided education to the patient based on review of the above and I have provided the patient with a written personalized  care plan for preventive services.     Jill Alexanders, MD   07/29/2019

## 2019-07-30 LAB — LIPID PANEL
Chol/HDL Ratio: 4.1 ratio (ref 0.0–5.0)
Cholesterol, Total: 155 mg/dL (ref 100–199)
HDL: 38 mg/dL — ABNORMAL LOW (ref >39)
LDL Chol Calc (NIH): 96 mg/dL (ref 0–99)
Triglycerides: 117 mg/dL (ref 0–149)
VLDL Cholesterol Cal: 21 mg/dL (ref 5–40)

## 2019-07-30 LAB — COMPREHENSIVE METABOLIC PANEL
ALT: 33 IU/L (ref 0–44)
AST: 29 IU/L (ref 0–40)
Albumin/Globulin Ratio: 1.6 (ref 1.2–2.2)
Albumin: 4.4 g/dL (ref 3.7–4.7)
Alkaline Phosphatase: 63 IU/L (ref 39–117)
BUN/Creatinine Ratio: 19 (ref 10–24)
BUN: 18 mg/dL (ref 8–27)
Bilirubin Total: 0.5 mg/dL (ref 0.0–1.2)
CO2: 23 mmol/L (ref 20–29)
Calcium: 9.5 mg/dL (ref 8.6–10.2)
Chloride: 104 mmol/L (ref 96–106)
Creatinine, Ser: 0.97 mg/dL (ref 0.76–1.27)
GFR calc Af Amer: 87 mL/min/{1.73_m2} (ref >59)
GFR calc non Af Amer: 75 mL/min/{1.73_m2} (ref >59)
Globulin, Total: 2.8 g/dL (ref 1.5–4.5)
Glucose: 78 mg/dL (ref 65–99)
Potassium: 4.2 mmol/L (ref 3.5–5.2)
Sodium: 141 mmol/L (ref 134–144)
Total Protein: 7.2 g/dL (ref 6.0–8.5)

## 2019-07-30 LAB — CBC WITH DIFFERENTIAL/PLATELET
Basophils Absolute: 0 10*3/uL (ref 0.0–0.2)
Basos: 0 %
EOS (ABSOLUTE): 0 10*3/uL (ref 0.0–0.4)
Eos: 0 %
Hematocrit: 45.4 % (ref 37.5–51.0)
Hemoglobin: 16.3 g/dL (ref 13.0–17.7)
Immature Grans (Abs): 0 10*3/uL (ref 0.0–0.1)
Immature Granulocytes: 0 %
Lymphocytes Absolute: 1 10*3/uL (ref 0.7–3.1)
Lymphs: 14 %
MCH: 31.8 pg (ref 26.6–33.0)
MCHC: 35.9 g/dL — ABNORMAL HIGH (ref 31.5–35.7)
MCV: 89 fL (ref 79–97)
Monocytes Absolute: 0.6 10*3/uL (ref 0.1–0.9)
Monocytes: 8 %
Neutrophils Absolute: 5.3 10*3/uL (ref 1.4–7.0)
Neutrophils: 78 %
Platelets: 209 10*3/uL (ref 150–450)
RBC: 5.12 x10E6/uL (ref 4.14–5.80)
RDW: 12.1 % (ref 11.6–15.4)
WBC: 6.9 10*3/uL (ref 3.4–10.8)

## 2019-07-31 ENCOUNTER — Telehealth: Payer: Self-pay | Admitting: Family Medicine

## 2019-07-31 MED ORDER — ALPRAZOLAM 0.25 MG PO TABS: 0.2500 mg | ORAL_TABLET | Freq: Every day | ORAL | 0 refills | Status: DC | PRN

## 2019-07-31 NOTE — Telephone Encounter (Signed)
Pt called for refills of Xanax. Pt states he meant to mentioned it at his appt and forgot. [facility] to [facility] [address]

## 2019-09-14 ENCOUNTER — Encounter (HOSPITAL_BASED_OUTPATIENT_CLINIC_OR_DEPARTMENT_OTHER): Payer: Self-pay | Admitting: Emergency Medicine

## 2019-09-14 ENCOUNTER — Emergency Department (HOSPITAL_BASED_OUTPATIENT_CLINIC_OR_DEPARTMENT_OTHER)
Admission: EM | Admit: 2019-09-14 | Discharge: 2019-09-14 | Disposition: A | Discharge status: Home / Self Care | Payer: Medicare HMO | Attending: Emergency Medicine | Admitting: Emergency Medicine

## 2019-09-14 ENCOUNTER — Other Ambulatory Visit: Payer: Self-pay

## 2019-09-14 DIAGNOSIS — Z79899 Other long term (current) drug therapy: Secondary | ICD-10-CM | POA: Insufficient documentation

## 2019-09-14 DIAGNOSIS — E785 Hyperlipidemia, unspecified: Secondary | ICD-10-CM | POA: Insufficient documentation

## 2019-09-14 DIAGNOSIS — Z885 Allergy status to narcotic agent status: Secondary | ICD-10-CM | POA: Diagnosis not present

## 2019-09-14 DIAGNOSIS — R509 Fever, unspecified: Secondary | ICD-10-CM | POA: Diagnosis present

## 2019-09-14 DIAGNOSIS — K0889 Other specified disorders of teeth and supporting structures: Secondary | ICD-10-CM | POA: Diagnosis not present

## 2019-09-14 DIAGNOSIS — Z0000 Encounter for general adult medical examination without abnormal findings: Secondary | ICD-10-CM

## 2019-09-14 DIAGNOSIS — Z87891 Personal history of nicotine dependence: Secondary | ICD-10-CM | POA: Diagnosis not present

## 2019-09-14 DIAGNOSIS — Z20822 Contact with and (suspected) exposure to covid-19: Secondary | ICD-10-CM | POA: Diagnosis not present

## 2019-09-14 MED ORDER — AMOXICILLIN 500 MG PO CAPS: 500.0000 mg | ORAL_CAPSULE | Freq: Three times a day (TID) | ORAL | 0 refills | Status: DC

## 2019-09-14 NOTE — ED Notes (Signed)
ED Provider at bedside. 

## 2019-09-14 NOTE — ED Provider Notes (Signed)
[facility] EMERGENCY DEPARTMENT Provider Note   CSN: [id number] Arrival date & time: 09/14/19  2020    History Chief Complaint  Patient presents with  . Fever    Logan Banks is a 79 y.o. male with no prior past medical history who presents for evaluation of fever.  Patient states he felt warm and stated he took his temperature at home which was 100.3.  Did see a dentist today for a root canal which he had x-rays performed which do not show any evidence of infection, per patient.  He denies headache, lightheadedness, dizziness, neck pain, neck stiffness, chest pain, shortness of breath, cough, hemoptysis, nausea, vomiting, abdominal pain, diarrhea, dysuria, rashes or lesions.  He did take an old amoxicillin at the house.  He has not taken any antipyretics.  Denies aggravating or relieving factors.  History obtained from patient and past medical records.  No interpreter is used.  HPI     Past Medical History:  Diagnosis Date  . BPH (benign prostatic hyperplasia)   . Dysrhythmia   . Frozen shoulder left  . Hyperlipidemia   . PONV (postoperative nausea and vomiting)     Patient Active Problem List   Diagnosis Date Noted  . Senile purpura ([facility]) 01/14/2018  . Upper airway cough syndrome 12/11/2017  . Hyperlipidemia 10/23/2010  . Allergic rhinitis due to pollen 10/23/2010  . Family history of heart disease in male family member before age 21 10/23/2010  . BPH (benign prostatic hyperplasia)     Past Surgical History:  Procedure Laterality Date  . COLONOSCOPY  2009   MEDOFF  . HERNIA REPAIR    . INGUINAL HERNIA REPAIR Right 07/08/2017   Procedure: LAPAROSCOPIC RIGHT  INGUINAL HERNIA REPAIR;  Surgeon: Axel Filler, MD;  Location: [facility] OR;  Service: General;  Laterality: Right;  . INSERTION OF MESH Right 07/08/2017   Procedure: INSERTION OF MESH;  Surgeon: Axel Filler, MD;  Location: [facility] OR;  Service: General;  Laterality: Right;  . TRIGGER FINGER RELEASE  Bilateral        Family History  Problem Relation Age of Onset  . Coronary artery disease Other     Social History   Tobacco Use  . Smoking status: Former Smoker    Packs/day: 0.50    Years: 8.00    Pack years: 4.00    Types: Cigarettes    Quit date: 05/21/1970    Years since quitting: 49.3  . Smokeless tobacco: Never Used  Substance Use Topics  . Alcohol use: Yes    Alcohol/week: 1.0 standard drinks    Types: 1 Glasses of wine per week    Comment: rare  . Drug use: No    Home Medications Prior to Admission medications   Medication Sig Start Date End Date Taking? Authorizing Provider  acetaminophen (TYLENOL) 500 MG tablet Take 1,000 mg by mouth daily as needed for moderate pain or headache.    [provider]  albuterol (VENTOLIN HFA) 108 (90 Base) MCG/ACT inhaler TAKE 2 PUFFS BY MOUTH EVERY 6 HOURS AS NEEDED FOR WHEEZE OR SHORTNESS OF BREATH 02/24/19   Ronnald Nian, MD  ALPRAZolam Prudy Feeler) 0.25 MG tablet Take 1 tablet (0.25 mg total) by mouth daily as needed for anxiety. 07/31/19   Ronnald Nian, MD  amoxicillin (AMOXIL) 500 MG capsule Take 1 capsule (500 mg total) by mouth 3 (three) times daily. 09/14/19   Jariyah Hackley A, PA-C  APPLE CIDER VINEGAR PO Take by mouth.    [provider]  aspirin EC 81 MG tablet Take 81 mg by mouth daily.    [provider]  atorvastatin (LIPITOR) 10 MG tablet Take 1 tablet (10 mg total) by mouth daily. 07/29/19   Ronnald Nian, MD  chlorhexidine (PERIDEX) 0.12 % solution SMARTSIG:15 Milliliter(s) By Mouth Morning-Night 07/15/19   [provider]  Coenzyme Q10 (CO Q 10 PO) Take by mouth.    [provider]  Multiple Vitamins-Minerals (MULTIVITAMIN WITH MINERALS) tablet Take 1 tablet by mouth daily.     [provider]  Omega-3 Fatty Acids (FISH OIL) 1200 MG CAPS Take 1,200 mg by mouth daily.    [provider]  tadalafil (CIALIS) 20 MG tablet Take 1 tablet (20 mg total) by  mouth daily as needed for erectile dysfunction. 07/29/19   Ronnald Nian, MD  trimethoprim-polymyxin b (POLYTRIM) ophthalmic solution Place 1 drop into the left eye every 4 (four) hours. Patient not taking: Reported on 07/29/2019 04/24/19   Hetty Blend L, NP-C  Zinc Gluconate (ZINC COLD THERAPY PO) Take 35 mg by mouth.    [provider]    Allergies    Codeine  Review of Systems   Review of Systems  Constitutional: Positive for fever. Negative for activity change, appetite change, chills, diaphoresis, fatigue and unexpected weight change.  HENT: Negative.   Respiratory: Negative.   Cardiovascular: Negative.   Gastrointestinal: Negative.   Genitourinary: Negative.   Musculoskeletal: Negative.   Skin: Negative.   Neurological: Negative.   All other systems reviewed and are negative.   Physical Exam Updated Vital Signs BP (!) 153/82 (BP Location: Right Arm)   Pulse (!) 106   Temp 99.4 F (37.4 C) (Oral)   Resp 16   SpO2 96%   Physical Exam Vitals and nursing note reviewed.  Constitutional:      General: He is not in acute distress.    Appearance: He is not ill-appearing, toxic-appearing or diaphoretic.  HENT:     Head: Normocephalic and atraumatic.     Jaw: There is normal jaw occlusion.     Right Ear: Tympanic membrane, ear canal and external ear normal. There is no impacted cerumen. Tympanic membrane is not injected, scarred, perforated, erythematous, retracted or bulging.     Left Ear: Tympanic membrane, ear canal and external ear normal. There is no impacted cerumen. No hemotympanum. Tympanic membrane is not injected, scarred, perforated, erythematous, retracted or bulging.     Ears:     Comments: No Mastoid tenderness.    Nose:     Comments: No sinus tenderness.    Mouth/Throat:     Comments: Posterior oropharynx clear.  Mucous membranes moist.  Tonsils without erythema or exudate.  Uvula midline without deviation.  No evidence of PTA or RPA.  No drooling,  dysphasia or trismus.  Phonation normal. No periapical abscess. Multiple crown from prior cavity fillings. Neck:     Trachea: Trachea and phonation normal.     Meningeal: Brudzinski's sign and Kernig's sign absent.     Comments: No Neck stiffness or neck rigidity.  No meningismus.  No cervical lymphadenopathy. Cardiovascular:     Comments: No murmurs rubs or gallops. Pulmonary:     Comments: Clear to auscultation bilaterally without wheeze, rhonchi or rales.  No accessory muscle usage.  Able speak in full sentences. Abdominal:     Comments: Soft, nontender without rebound or guarding.  No CVA tenderness.  Musculoskeletal:     Comments: Moves all 4 extremities without  difficulty.  Lower extremities without edema, erythema or warmth.  Skin:    Comments: Brisk capillary refill.  No rashes or lesions.  Neurological:     Mental Status: He is alert.     Comments: Ambulatory in department without difficulty.  Cranial nerves II through XII grossly intact.  No facial droop.  No aphasia.     ED Results / Procedures / Treatments   Labs (all labs ordered are listed, but only abnormal results are displayed) Labs Reviewed - No data to display  EKG None  Radiology No results found.  Procedures Procedures (including critical care time)  Medications Ordered in ED Medications - No data to display  ED Course  I have reviewed the triage vital signs and the nursing notes.  Pertinent labs & imaging results that were available during my care of the patient were reviewed by me and considered in my medical decision making (see chart for details).  79 year old male presents for evaluation of fever.  Afebrile here in the emergency department, nonseptic, not ill-appearing.  Has not taken any antipyretics.  Heart and lungs clear.  Abdomen soft.  No urinary or upper respiratory complaints.  No rashes or lesions.  Tolerating p.o. intake.  Go to the dentist earlier today for a possible root canal which he  had x-rays done.  Not actually have any work performed at the dentist.  Was told he did not have an infection.  Shared decision making with patient for further work-up here in the emergency department.  Patient declines at this time.  He appears overall well.  Did discuss possibly obtaining Covid testing.  He declined.  States he has deceived both vaccines and he does not want to be tested at this time.  He did state that his thermometer was greater than 2 years old.  Discussed with him low grade temp here in the emergency department and he has not taken any antipyretics at home.  He states he will continue to monitor his symptoms at home however does not want any further work-up which I feel is perforated at this time.  He may return for any worsening symptoms.  Reevaluate patient states he would like ABX for a possible dental infection. Will dc home with Amox for possible dental source.  The patient has been appropriately medically screened and/or stabilized in the ED. I have low suspicion for any other emergent medical condition which would require further screening, evaluation or treatment in the ED or require inpatient management.  Patient is hemodynamically stable and in no acute distress.  Patient able to ambulate in department prior to ED.  Evaluation does not show acute pathology that would require ongoing or additional emergent interventions while in the emergency department or further inpatient treatment.  I have discussed the diagnosis with the patient and answered all questions.  Pain is been managed while in the emergency department and patient has no further complaints prior to discharge.  Patient is comfortable with plan discussed in room and is stable for discharge at this time.  I have discussed strict return precautions for returning to the emergency department.  Patient was encouraged to follow-up with PCP/specialist refer to at discharge.  Patient seen and evaluated by attending, Dr.  Charm Barges who agrees with above treatment, plan and disposition.  Clinical Course as of Sep 13 2224  Mon Sep 14, 2019  6122 79 year old male here with a possible fever at home.  He had no symptoms and was using an old thermometer.  He has not taken anything for it and now has a minimal low-grade fever here.  He currently does not want any further testing.  Recommended that he continue to monitor his temperature and if he has any symptoms then should return for evaluation.   [MB]    Clinical Course User Index [MB] Hayden Rasmussen, MD   MDM Rules/Calculators/A&P                       Final Clinical Impression(s) / ED Diagnoses Final diagnoses:  Well adult health check  Pain, dental    Rx / DC Orders ED Discharge Orders         Ordered    amoxicillin (AMOXIL) 500 MG capsule  3 times daily     09/14/19 2225           Sarie Stall A, PA-C 09/14/19 2226    Hayden Rasmussen, MD 09/15/19 1046

## 2019-09-14 NOTE — ED Triage Notes (Signed)
Took his temp before dinner and it was 101.3 felt warm  Denies cough, dysuria,  No pain anywhere , no h/a, n had been to  Dentist today  He states  Took an  Old amoxicillian that he had house

## 2019-09-14 NOTE — Discharge Instructions (Addendum)
If you notice any rashes, lesions, chest pain, shortness of breath, cough, facial swelling or burning with urination please seek reevaluation

## 2019-09-18 ENCOUNTER — Encounter: Payer: Self-pay | Admitting: Family Medicine

## 2019-09-18 ENCOUNTER — Other Ambulatory Visit: Payer: Self-pay

## 2019-09-18 ENCOUNTER — Telehealth (INDEPENDENT_AMBULATORY_CARE_PROVIDER_SITE_OTHER): Payer: Medicare HMO | Admitting: Family Medicine

## 2019-09-18 VITALS — Temp 100.3°F | Wt 174.0 lb

## 2019-09-18 DIAGNOSIS — Z20822 Contact with and (suspected) exposure to covid-19: Secondary | ICD-10-CM

## 2019-09-18 DIAGNOSIS — R509 Fever, unspecified: Secondary | ICD-10-CM

## 2019-09-18 DIAGNOSIS — Z20828 Contact with and (suspected) exposure to other viral communicable diseases: Secondary | ICD-10-CM | POA: Diagnosis not present

## 2019-09-18 NOTE — Progress Notes (Signed)
   Subjective:    Patient ID: Logan Banks, male    DOB: 1940/10/16, 79 y.o.   MRN: [id number]  HPI Documentation for virtual audio and video telecommunications through [place] encounter:. The patient was located at home. The provider was located in the office. The patient did consent to this visit and is aware of possible charges through their insurance for this visit. The other persons participating in this telemedicine service were none. Time spent on call was 8 minutes  This virtual service is not related to other E/M service within previous 7 days. 4 days ago he developed a fever and chills up to 101.  He then developed a slight cough.  He was seen in an emergency room and given Amoxil 500 3 times daily for treatment of dental pain.  He continues on that but did see his dentist who said that he did not have an infection.  He has not had sore throat, earache, productive cough.  No smell or taste change.  He did get the Covid vaccine.   Review of Systems     Objective:   Physical Exam Alert and in no distress otherwise not examined       Assessment & Plan:  Fever and chills I explained that the prudent thing to do would be to get the Covid test and if positive call me so I can get him set up to get the IV antibody and if the test is negative, continue on the Amoxil and treat his symptoms.  He was comfortable with that.

## 2019-09-21 ENCOUNTER — Telehealth: Payer: Self-pay | Admitting: Family Medicine

## 2019-09-21 NOTE — Telephone Encounter (Signed)
Pt called and states covid negative.  However he needs something for cough.  He saw you for virtual visit.  Please send something to [facility] [address]

## 2019-09-21 NOTE — Telephone Encounter (Signed)
Pt was called and advised . Pt says he has been taking this for a few days now. He will continue and call back if no change. KH

## 2019-09-21 NOTE — Telephone Encounter (Signed)
Have him use Robitussin-DM during the day and NyQuil at night and if that does not work then I will call in codeine for him.

## 2019-09-23 ENCOUNTER — Telehealth: Payer: Self-pay | Admitting: Family Medicine

## 2019-09-23 MED ORDER — BENZONATATE 100 MG PO CAPS: 200.0000 mg | ORAL_CAPSULE | Freq: Three times a day (TID) | ORAL | 0 refills | Status: DC | PRN

## 2019-09-23 NOTE — Telephone Encounter (Signed)
Pt called and left message he is [facility] [address]. Pt ph [phone]

## 2019-09-23 NOTE — Telephone Encounter (Signed)
I called it into [facility]

## 2019-09-23 NOTE — Telephone Encounter (Signed)
Called pt to advised cough med called in.  Pt wanted to know can he still use dayquill and nyquill.  Asked Dr Susann Givens and he said yes if needed.  Pt advised.

## 2019-09-23 NOTE — Telephone Encounter (Signed)
Pt called and wants to know if he needs to stop taking the dayquil and the nyquil or can he continue taking it with the tessalon cough pearls pt can be reached at [phone]

## 2019-09-29 ENCOUNTER — Telehealth: Payer: Self-pay | Admitting: Family Medicine

## 2019-09-29 MED ORDER — AMOXICILLIN-POT CLAVULANATE 875-125 MG PO TABS: 1.0000 | ORAL_TABLET | Freq: Two times a day (BID) | ORAL | 0 refills | Status: DC

## 2019-09-29 NOTE — Telephone Encounter (Signed)
Pt called and states he is still not 100%. He states he just feels off and occasionally lightheaded. He is still using the nose spray but cough is 80% better. He did finish cough pills. [facility] uses [facility] [address] and can be reached at [phone].

## 2019-09-29 NOTE — Telephone Encounter (Signed)
He states that he is feeling roughly 80% better but not back to his normal self.  After discussion with him I think Augmentin would be a reasonable choice.  He will keep me informed as to how he is doing.

## 2019-10-21 DIAGNOSIS — R69 Illness, unspecified: Secondary | ICD-10-CM | POA: Diagnosis not present

## 2020-01-08 ENCOUNTER — Ambulatory Visit (INDEPENDENT_AMBULATORY_CARE_PROVIDER_SITE_OTHER): Payer: Medicare HMO | Admitting: Family Medicine

## 2020-01-08 ENCOUNTER — Encounter: Payer: Self-pay | Admitting: Family Medicine

## 2020-01-08 ENCOUNTER — Other Ambulatory Visit: Payer: Self-pay

## 2020-01-08 VITALS — BP 108/64 | HR 72 | Temp 96.8°F | Wt 173.2 lb

## 2020-01-08 DIAGNOSIS — K2900 Acute gastritis without bleeding: Secondary | ICD-10-CM

## 2020-01-08 NOTE — Patient Instructions (Signed)
Take 2 Prilosec daily for the next 2 weeks.  See if that calms it down if it does not then call me

## 2020-01-08 NOTE — Progress Notes (Signed)
   Subjective:    Patient ID: Logan Banks, male    DOB: January 30, 1941, 79 y.o.   MRN: [id number]  HPI He complains of a 2-week history of midepigastric intermittent soreness.  He states that when he eats within a few minutes he feels a pressure sensation in that area and has occasional dysphagia and slight nausea.  No vomiting, diarrhea, black tarry stools.  He has tried Tums with some relief of his symptoms.  He rarely drinks does not smoke.  His meds are unchanged.   Review of Systems     Objective:   Physical Exam Alert and in no distress.  Cardiac exam shows regular rhythm without murmurs or gallops.  Lungs are clear to auscultation.  Abdominal exam shows decreased bowel sounds without masses or tenderness.      Assessment & Plan:  Acute gastritis without hemorrhage, unspecified gastritis type Recommend he take 2 Prilosec daily for the next 2 weeks and pay attention to anything that makes it better or worse.  He will call me at that time if still having difficulty and I will consider referral to GI for further evaluation.

## 2020-01-27 ENCOUNTER — Other Ambulatory Visit (INDEPENDENT_AMBULATORY_CARE_PROVIDER_SITE_OTHER): Payer: Medicare HMO

## 2020-01-27 ENCOUNTER — Other Ambulatory Visit: Payer: Self-pay

## 2020-01-27 DIAGNOSIS — Z23 Encounter for immunization: Secondary | ICD-10-CM

## 2020-02-01 ENCOUNTER — Telehealth: Payer: Self-pay | Admitting: Family Medicine

## 2020-02-01 ENCOUNTER — Other Ambulatory Visit: Payer: Self-pay

## 2020-02-01 DIAGNOSIS — K2900 Acute gastritis without bleeding: Secondary | ICD-10-CM

## 2020-02-01 NOTE — Telephone Encounter (Signed)
Time to send to GI

## 2020-02-01 NOTE — Telephone Encounter (Signed)
Pt called back and states that his stomach is still sore and is still having the stomach pain , he took the prilosec for the 2 weeks and that seemed to help but as soon as he stopped taking that the pain come right back, he wants to know if needs to go ahead the GI dr please advise pt can be reached at [phone]

## 2020-02-02 NOTE — Telephone Encounter (Signed)
Done and pt advised referral was put in. Kh

## 2020-02-05 ENCOUNTER — Telehealth: Payer: Self-pay

## 2020-02-05 NOTE — Telephone Encounter (Signed)
Pt. Called back said he was just speaking with you and wanted to give you so more info. He said [facility] needs for you to request his past colonoscopies from Dr. Valarie Cones office and there phone number for there records dept. Is [phone] before they could schedule him there. They need them faxed to them once we have them.

## 2020-02-05 NOTE — Telephone Encounter (Signed)
Requested stat 02-05-20.... KH

## 2020-02-08 NOTE — Telephone Encounter (Signed)
Still no records. Will call later today. KH

## 2020-02-11 ENCOUNTER — Encounter: Payer: Self-pay | Admitting: Gastroenterology

## 2020-02-24 ENCOUNTER — Ambulatory Visit (INDEPENDENT_AMBULATORY_CARE_PROVIDER_SITE_OTHER): Payer: Medicare HMO | Admitting: Family Medicine

## 2020-02-24 ENCOUNTER — Encounter: Payer: Self-pay | Admitting: Family Medicine

## 2020-02-24 ENCOUNTER — Other Ambulatory Visit: Payer: Self-pay

## 2020-02-24 VITALS — BP 120/72 | HR 75 | Temp 97.1°F | Wt 175.4 lb

## 2020-02-24 DIAGNOSIS — R0689 Other abnormalities of breathing: Secondary | ICD-10-CM | POA: Diagnosis not present

## 2020-02-24 NOTE — Progress Notes (Deleted)
     Established patient visit   Patient: Logan Banks   DOB: Oct 10, 1940   79 y.o. Male  MRN: [id number] Visit Date: 02/24/2020  Today's healthcare provider: Benard Rink  No chief complaint on file. I,Christeen Lai C Tanina Barb,acting as a Neurosurgeon for [employer] documented all relevant documentation on the behalf of Talmage Coin directed by  Benard Rink while in the presence of [employer].   Subjective    HPI    {Show patient history (optional):23778::" "}   Medications: Outpatient Medications Prior to Visit  Medication Sig  . acetaminophen (TYLENOL) 500 MG tablet Take 1,000 mg by mouth daily as needed for moderate pain or headache.  . albuterol (VENTOLIN HFA) 108 (90 Base) MCG/ACT inhaler TAKE 2 PUFFS BY MOUTH EVERY 6 HOURS AS NEEDED FOR WHEEZE OR SHORTNESS OF BREATH  . ALPRAZolam (XANAX) 0.25 MG tablet Take 1 tablet (0.25 mg total) by mouth daily as needed for anxiety. (Patient not taking: Reported on 09/18/2019)  . amoxicillin (AMOXIL) 500 MG capsule Take 1 capsule (500 mg total) by mouth 3 (three) times daily. (Patient not taking: Reported on 01/08/2020)  . amoxicillin-clavulanate (AUGMENTIN) 875-125 MG tablet Take 1 tablet by mouth 2 (two) times daily. (Patient not taking: Reported on 01/08/2020)  . APPLE CIDER VINEGAR PO Take by mouth. (Patient not taking: Reported on 01/08/2020)  . aspirin EC 81 MG tablet Take 81 mg by mouth daily.  Marland Kitchen atorvastatin (LIPITOR) 10 MG tablet Take 1 tablet (10 mg total) by mouth daily.  . benzonatate (TESSALON) 100 MG capsule Take 2 capsules (200 mg total) by mouth 3 (three) times daily as needed for cough. (Patient not taking: Reported on 01/08/2020)  . chlorhexidine (PERIDEX) 0.12 % solution SMARTSIG:15 Milliliter(s) By Mouth Morning-Night  . Coenzyme Q10 (CO Q 10 PO) Take by mouth.  . Multiple Vitamins-Minerals (MULTIVITAMIN WITH MINERALS) tablet Take 1 tablet by mouth daily.   . Omega-3 Fatty Acids (FISH  OIL) 1200 MG CAPS Take 1,200 mg by mouth daily.  . tadalafil (CIALIS) 20 MG tablet Take 1 tablet (20 mg total) by mouth daily as needed for erectile dysfunction.  Marland Kitchen trimethoprim-polymyxin b (POLYTRIM) ophthalmic solution Place 1 drop into the left eye every 4 (four) hours. (Patient not taking: Reported on 07/29/2019)  . Zinc Gluconate (ZINC COLD THERAPY PO) Take 35 mg by mouth.   No facility-administered medications prior to visit.    Review of Systems  {Heme  Chem  Endocrine  Serology  Results Review (optional):23779::" "}  Objective    There were no vitals taken for this visit. {Show previous vital signs (optional):23777::" "}  Physical Exam  ***  No results found for any visits on 02/24/20.  Assessment & Plan     ***  No follow-ups on file.      {provider attestation***:1}   Sharlot Gowda, MD  [facility] [phone] (phone) [phone] (fax)  [facility]

## 2020-02-24 NOTE — Progress Notes (Signed)
   Subjective:    Patient ID: Logan Banks, male    DOB: 11-12-1940, 79 y.o.   MRN: [id number]  HPI He states that over the last several months he has noted intermittent episodes of sighing but no chest pain, true shortness of breath, PND or DOE, cough or congestion.Marland Kitchen  He is physically active and riding his bike as well as doing yard work and has no difficulty with that.  He does not relate this to any stressful situation.   Review of Systems     Objective:   Physical Exam Alert and in no distress.  Cardiac exam shows regular rhythm without murmurs or gallops.  Lungs are clear to auscultation.       Assessment & Plan:  Sighing respiration I explained that sighing is a normal physiologic response and if no major concern.  Discussed chest pain, shortness of breath, DOE and PND as well as cough or congestion is being the major factors.  He was comfortable with that.  He will call if he has further difficulty. He would also like to be referred to a different gastroenterologist as she does not want to wait till mid November. Also discussed Covid vaccine in detail with him recommending the booster.  At this point he is not interested.  All of his questions were answered.

## 2020-02-29 DIAGNOSIS — K219 Gastro-esophageal reflux disease without esophagitis: Secondary | ICD-10-CM | POA: Diagnosis not present

## 2020-02-29 DIAGNOSIS — R140 Abdominal distension (gaseous): Secondary | ICD-10-CM | POA: Diagnosis not present

## 2020-02-29 DIAGNOSIS — E782 Mixed hyperlipidemia: Secondary | ICD-10-CM | POA: Diagnosis not present

## 2020-03-15 DIAGNOSIS — K449 Diaphragmatic hernia without obstruction or gangrene: Secondary | ICD-10-CM | POA: Diagnosis not present

## 2020-03-15 DIAGNOSIS — K2100 Gastro-esophageal reflux disease with esophagitis, without bleeding: Secondary | ICD-10-CM | POA: Diagnosis not present

## 2020-03-15 LAB — HM COLONOSCOPY

## 2020-03-17 DIAGNOSIS — K219 Gastro-esophageal reflux disease without esophagitis: Secondary | ICD-10-CM | POA: Insufficient documentation

## 2020-03-17 DIAGNOSIS — K449 Diaphragmatic hernia without obstruction or gangrene: Secondary | ICD-10-CM

## 2020-03-17 DIAGNOSIS — K208 Other esophagitis without bleeding: Secondary | ICD-10-CM

## 2020-03-18 ENCOUNTER — Encounter: Payer: Self-pay | Admitting: Family Medicine

## 2020-03-30 DIAGNOSIS — K2100 Gastro-esophageal reflux disease with esophagitis, without bleeding: Secondary | ICD-10-CM | POA: Diagnosis not present

## 2020-03-30 DIAGNOSIS — R140 Abdominal distension (gaseous): Secondary | ICD-10-CM | POA: Diagnosis not present

## 2020-04-06 ENCOUNTER — Ambulatory Visit: Payer: Medicare HMO | Admitting: Gastroenterology

## 2020-05-05 ENCOUNTER — Ambulatory Visit (INDEPENDENT_AMBULATORY_CARE_PROVIDER_SITE_OTHER): Payer: Medicare HMO

## 2020-05-05 ENCOUNTER — Other Ambulatory Visit: Payer: Self-pay

## 2020-05-05 DIAGNOSIS — Z23 Encounter for immunization: Secondary | ICD-10-CM

## 2020-06-17 DIAGNOSIS — H52203 Unspecified astigmatism, bilateral: Secondary | ICD-10-CM | POA: Diagnosis not present

## 2020-06-17 DIAGNOSIS — Z961 Presence of intraocular lens: Secondary | ICD-10-CM | POA: Diagnosis not present

## 2020-06-17 DIAGNOSIS — H524 Presbyopia: Secondary | ICD-10-CM | POA: Diagnosis not present

## 2020-06-21 DIAGNOSIS — K449 Diaphragmatic hernia without obstruction or gangrene: Secondary | ICD-10-CM | POA: Diagnosis not present

## 2020-06-21 DIAGNOSIS — R12 Heartburn: Secondary | ICD-10-CM | POA: Diagnosis not present

## 2020-06-21 DIAGNOSIS — K219 Gastro-esophageal reflux disease without esophagitis: Secondary | ICD-10-CM | POA: Diagnosis not present

## 2020-06-21 LAB — HM COLONOSCOPY

## 2020-06-29 ENCOUNTER — Encounter: Payer: Self-pay | Admitting: Family Medicine

## 2020-07-29 ENCOUNTER — Ambulatory Visit: Payer: Medicare HMO | Admitting: Family Medicine

## 2020-08-26 ENCOUNTER — Ambulatory Visit: Payer: Medicare HMO | Admitting: Family Medicine

## 2020-08-29 ENCOUNTER — Emergency Department (HOSPITAL_BASED_OUTPATIENT_CLINIC_OR_DEPARTMENT_OTHER)
Admission: EM | Admit: 2020-08-29 | Discharge: 2020-08-29 | Disposition: A | Discharge status: Home / Self Care | Payer: Medicare HMO | Attending: Emergency Medicine | Admitting: Emergency Medicine

## 2020-08-29 ENCOUNTER — Encounter (HOSPITAL_BASED_OUTPATIENT_CLINIC_OR_DEPARTMENT_OTHER): Payer: Self-pay

## 2020-08-29 ENCOUNTER — Other Ambulatory Visit: Payer: Self-pay

## 2020-08-29 DIAGNOSIS — R040 Epistaxis: Secondary | ICD-10-CM

## 2020-08-29 DIAGNOSIS — Z87891 Personal history of nicotine dependence: Secondary | ICD-10-CM | POA: Insufficient documentation

## 2020-08-29 DIAGNOSIS — Z7982 Long term (current) use of aspirin: Secondary | ICD-10-CM | POA: Insufficient documentation

## 2020-08-29 NOTE — ED Provider Notes (Signed)
[facility] EMERGENCY DEPT Provider Note   CSN: [id number] Arrival date & time: 08/29/20  1250     History Chief Complaint  Patient presents with  . Epistaxis    Logan Banks is a 80 y.o. male.  He is here with a complaint of nosebleed from his right nares that started after he blew his nose about an hour ago.  It is since stopped.  He has no history of nosebleeds.  He says he had allergy symptoms his whole last week with nasal congestion runny nose and cough.  No fevers.  The history is provided by the patient.  Epistaxis Location:  R nare Severity:  Moderate Duration:  1 hour Timing:  Constant Progression:  Resolved Chronicity:  New Context: not anticoagulants   Relieved by:  Applying pressure Worsened by:  Nothing Ineffective treatments:  None tried Associated symptoms: congestion and cough   Associated symptoms: no blood in oropharynx, no fever and no headaches        Past Medical History:  Diagnosis Date  . BPH (benign prostatic hyperplasia)   . Dysrhythmia   . Frozen shoulder left  . Hyperlipidemia   . PONV (postoperative nausea and vomiting)     Patient Active Problem List   Diagnosis Date Noted  . 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 Hernia, hiatal 03/17/2020  . Senile purpura (HCC) 01/14/2018  . Upper airway cough syndrome 12/11/2017  . Hyperlipidemia 10/23/2010  . Allergic rhinitis due to pollen 10/23/2010  . Family history of heart disease in male family member before age 74 10/23/2010  . BPH (benign prostatic hyperplasia)     Past Surgical History:  Procedure Laterality Date  . COLONOSCOPY  2009   MEDOFF  . HERNIA REPAIR    . INGUINAL HERNIA REPAIR Right 07/08/2017   Procedure: LAPAROSCOPIC RIGHT  INGUINAL HERNIA REPAIR;  Surgeon: Axel Filler, MD;  Location[facility] OR;  Service: General;  Laterality: Right;  . INSERTION OF MESH Right 07/08/2017   Procedure: INSERTION OF MESH;  Surgeon: Axel Filler, MD;   Location: [facility] OR;  Service: General;  Laterality: Right;  . TRIGGER FINGER RELEASE Bilateral        Family History  Problem Relation Age of Onset  . Coronary artery disease Other     Social History   Tobacco Use  . Smoking status: Former Smoker    Packs/day: 0.50    Years: 8.00    Pack years: 4.00    Types: Cigarettes    Quit date: 05/21/1970    Years since quitting: 50.3  . Smokeless tobacco: Never Used  Vaping Use  . Vaping Use: Never used  Substance Use Topics  . Alcohol use: Yes    Alcohol/week: 1.0 standard drink    Types: 1 Glasses of wine per week    Comment: rare  . Drug use: No    Home Medications Prior to Admission medications   Medication Sig Start Date End Date Taking? Authorizing Provider  tadalafil (CIALIS) 20 MG tablet Take 1 tablet (20 mg total) by mouth daily as needed for erectile dysfunction. 07/29/19  Yes Ronnald Nian, MD  acetaminophen (TYLENOL) 500 MG tablet Take 1,000 mg by mouth daily as needed for moderate pain or headache.    [provider]  albuterol (VENTOLIN HFA) 108 (90 Base) MCG/ACT inhaler TAKE 2 PUFFS BY MOUTH EVERY 6 HOURS AS NEEDED FOR WHEEZE OR SHORTNESS OF BREATH 02/24/19   Ronnald Nian, MD  ALPRAZolam Prudy Feeler) 0.25 MG tablet  Take 1 tablet (0.25 mg total) by mouth daily as needed for anxiety. Patient not taking: Reported on 09/18/2019 07/31/19   Ronnald Nian, MD  amoxicillin (AMOXIL) 500 MG capsule Take 1 capsule (500 mg total) by mouth 3 (three) times daily. Patient not taking: Reported on 01/08/2020 09/14/19   Henderly, Britni A, PA-C  amoxicillin-clavulanate (AUGMENTIN) 875-125 MG tablet Take 1 tablet by mouth 2 (two) times daily. Patient not taking: Reported on 01/08/2020 09/29/19   Ronnald Nian, MD  APPLE CIDER VINEGAR PO Take by mouth. Patient not taking: Reported on 01/08/2020    [provider]  aspirin EC 81 MG tablet Take 81 mg by mouth daily.    [provider]  atorvastatin (LIPITOR) 10 MG  tablet Take 1 tablet (10 mg total) by mouth daily. 07/29/19   Ronnald Nian, MD  benzonatate (TESSALON) 100 MG capsule Take 2 capsules (200 mg total) by mouth 3 (three) times daily as needed for cough. Patient not taking: Reported on 01/08/2020 09/23/19   Ronnald Nian, MD  chlorhexidine (PERIDEX) 0.12 % solution SMARTSIG:15 Milliliter(s) By Mouth Morning-Night 07/15/19   [provider]  Coenzyme Q10 (CO Q 10 PO) Take by mouth.    [provider]  guaiFENesin (MUCINEX) 600 MG 12 hr tablet Take 600 mg by mouth 2 (two) times daily.    [provider]  Multiple Vitamins-Minerals (MULTIVITAMIN WITH MINERALS) tablet Take 1 tablet by mouth daily.     [provider]  Omega-3 Fatty Acids (FISH OIL) 1200 MG CAPS Take 1,200 mg by mouth daily.    [provider]  trimethoprim-polymyxin b (POLYTRIM) ophthalmic solution Place 1 drop into the left eye every 4 (four) hours. Patient not taking: Reported on 07/29/2019 04/24/19   Hetty Blend L, NP-C  Zinc Gluconate (ZINC COLD THERAPY PO) Take 35 mg by mouth.    [provider]    Allergies    Codeine  Review of Systems   Review of Systems  Constitutional: Negative for fever.  HENT: Positive for congestion and nosebleeds.   Respiratory: Positive for cough.   Neurological: Negative for headaches.    Physical Exam Updated Vital Signs BP (!) 157/95 (BP Location: Right Arm)   Pulse 72   Temp 97.7 F (36.5 C) (Oral)   Resp 16   Ht 5\' 7"  (1.702 m)   Wt 79.6 kg   SpO2 100%   BMI 27.49 kg/m   Physical Exam Vitals and nursing note reviewed.  Constitutional:      Appearance: Normal appearance. He is well-developed.  HENT:     Head: Normocephalic and atraumatic.     Nose:     Comments: There is no active bleeding.  He has some dried blood around his right nare.  No obvious source identified on speculum exam.  No blood in the oropharynx.    Mouth/Throat:     Mouth: Mucous membranes are moist.      Pharynx: Oropharynx is clear.  Eyes:     Conjunctiva/sclera: Conjunctivae normal.  Pulmonary:     Effort: Pulmonary effort is normal.  Musculoskeletal:     Cervical back: Neck supple.  Skin:    General: Skin is warm and dry.  Neurological:     General: No focal deficit present.     Mental Status: He is alert.     GCS: GCS eye subscore is 4. GCS verbal subscore is 5. GCS motor subscore is 6.     ED Results /  Procedures / Treatments   Labs (all labs ordered are listed, but only abnormal results are displayed) Labs Reviewed - No data to display  EKG None  Radiology No results found.  Procedures Procedures   Medications Ordered in ED Medications - No data to display  ED Course  I have reviewed the triage vital signs and the nursing notes.  Pertinent labs & imaging results that were available during my care of the patient were reviewed by me and considered in my medical decision making (see chart for details).  Clinical Course as of 08/29/20 1736  Mon Aug 29, 2020  1317 No active nosebleed currently.  Will observe for a few minutes to make sure it does not restart.  Also counseled on how to apply pressure to the area. [MB]    Clinical Course User Index [MB] Terrilee Files, MD   MDM Rules/Calculators/A&P                           Final Clinical Impression(s) / ED Diagnoses Final diagnoses:  Epistaxis    Rx / DC Orders ED Discharge Orders    None       Terrilee Files, MD 08/29/20 1737

## 2020-08-29 NOTE — ED Triage Notes (Signed)
Pt reports that he started having a nosebleed x 1 hour ago. Pt has no active bleeding at this time. Pt reports that the bleeding has slowed at this time. Pt reports allergies have been bad this year.

## 2020-08-31 ENCOUNTER — Ambulatory Visit (INDEPENDENT_AMBULATORY_CARE_PROVIDER_SITE_OTHER): Payer: Medicare HMO | Admitting: Family Medicine

## 2020-08-31 ENCOUNTER — Encounter: Payer: Self-pay | Admitting: Family Medicine

## 2020-08-31 VITALS — BP 108/68 | HR 73 | Temp 97.6°F | Ht 66.75 in | Wt 168.6 lb

## 2020-08-31 DIAGNOSIS — D692 Other nonthrombocytopenic purpura: Secondary | ICD-10-CM | POA: Diagnosis not present

## 2020-08-31 DIAGNOSIS — R058 Other specified cough: Secondary | ICD-10-CM

## 2020-08-31 DIAGNOSIS — K449 Diaphragmatic hernia without obstruction or gangrene: Secondary | ICD-10-CM

## 2020-08-31 DIAGNOSIS — Z13220 Encounter for screening for lipoid disorders: Secondary | ICD-10-CM | POA: Diagnosis not present

## 2020-08-31 DIAGNOSIS — E785 Hyperlipidemia, unspecified: Secondary | ICD-10-CM

## 2020-08-31 DIAGNOSIS — Z1159 Encounter for screening for other viral diseases: Secondary | ICD-10-CM

## 2020-08-31 DIAGNOSIS — J301 Allergic rhinitis due to pollen: Secondary | ICD-10-CM

## 2020-08-31 DIAGNOSIS — Z0000 Encounter for general adult medical examination without abnormal findings: Secondary | ICD-10-CM | POA: Diagnosis not present

## 2020-08-31 DIAGNOSIS — K2100 Gastro-esophageal reflux disease with esophagitis, without bleeding: Secondary | ICD-10-CM | POA: Diagnosis not present

## 2020-08-31 DIAGNOSIS — Z7289 Other problems related to lifestyle: Secondary | ICD-10-CM | POA: Diagnosis not present

## 2020-08-31 DIAGNOSIS — Z8249 Family history of ischemic heart disease and other diseases of the circulatory system: Secondary | ICD-10-CM

## 2020-08-31 DIAGNOSIS — N400 Enlarged prostate without lower urinary tract symptoms: Secondary | ICD-10-CM | POA: Diagnosis not present

## 2020-08-31 DIAGNOSIS — Z86010 Personal history of colonic polyps: Secondary | ICD-10-CM | POA: Insufficient documentation

## 2020-08-31 NOTE — Progress Notes (Addendum)
Logan Banks is a 80 y.o. male who presents for annual CPE and follow-up on chronic medical conditions.  He has had difficulty recently with allergy symptoms of coughing, chest congestion, nasal congestion but states that he is feeling better.  He has been using Flonase as well as over-the-counter medicines for the coughing.  He continues on atorvastatin.  He does use Cialis on an as-needed basis.  He does have BPH but has no particular symptoms at the present time.  He has had reflux esophagitis and has had follow-up with Dr. Elnoria Howard concerning this.  He seems to doing well on the Prilosec.  He does have history of adenomatous colonic polyps with his last procedure in 2018.  He is working part-time.  In general things are going quite well for him.   Immunizations and Health Maintenance Immunization History  Administered Date(s) Administered  . Fluad Quad(high Dose 65+) 01/01/2019, 01/27/2020  . Influenza Split 02/13/2013  . Influenza, High Dose Seasonal PF 02/15/2014, 01/26/2015, 01/12/2016, 02/21/2017, 02/26/2018  . PFIZER(Purple Top)SARS-COV-2 Vaccination 06/12/2019, 07/03/2019, 05/05/2020  . Pneumococcal Conjugate-13 12/03/2013  . Pneumococcal Polysaccharide-23 12/15/2012  . Tdap 08/30/2010  . Zoster 12/07/2013  . Zoster Recombinat (Shingrix) 07/30/2018   Health Maintenance Due  Topic Date Due  . Hepatitis C Screening  Never done  . TETANUS/TDAP  08/29/2020    Last colonoscopy: 07/10/16 Last PSA: 12/15/12 Dentist:Q three months Ophtho: Q year Exercise: gym four days a week  Other doctors caring for patient include: Dr. Elnoria Howard GI , Dr. Nile Riggs ophthalmology,   Advanced Directives: Does Patient Have a Medical Advance Directive?: Yes Type of Advance Directive: Living will Does patient want to make changes to medical advance directive?: No - Patient declined  Depression screen:  See questionnaire below.     Depression screen [facility] 2/9 08/31/2020 07/29/2019 07/25/2018 04/17/2017 01/09/2017   Decreased Interest 0 0 0 0 0  Down, Depressed, Hopeless 0 0 0 0 0  PHQ - 2 Score 0 0 0 0 0    Fall Screen: See Questionaire below.   Fall Risk  08/31/2020 07/29/2019 07/25/2018 04/17/2017 01/09/2017  Falls in the past year? 0 0 0 No No  Number falls in past yr: 0 - - - -  Injury with Fall? 0 - - - -  Risk for fall due to : No Fall Risks - - - -  Follow up Falls evaluation completed - - - -    ADL screen:  See questionnaire below.  Functional Status Survey: Is the patient deaf or have difficulty hearing?: No Does the patient have difficulty seeing, even when wearing glasses/contacts?: No Does the patient have difficulty concentrating, remembering, or making decisions?: No Does the patient have difficulty walking or climbing stairs?: No Does the patient have difficulty dressing or bathing?: No Does the patient have difficulty doing errands alone such as visiting a doctor's office or shopping?: No   Review of Systems  Constitutional: -, -unexpected weight change, -anorexia, -fatigue Allergy: -sneezing, -itching,  Dermatology: denies changing moles, rash, lumps ENT: -runny nose, -ear pain, -sore throat,  Cardiology:  -chest pain, -palpitations, -orthopnea, Respiratory: -cough, -shortness of breath, -dyspnea on exertion, -wheezing,  Gastroenterology: -abdominal pain, -nausea, -vomiting, -diarrhea, -constipation, -dysphagia Hematology: -bleeding or bruising problems Musculoskeletal: -arthralgias, -myalgias, -joint swelling, -back pain, - Ophthalmology: -vision changes,  Urology: -dysuria, -difficulty urinating,  -urinary frequency, -urgency, incontinence Neurology: -, -numbness, , -memory loss, -falls, -dizziness    PHYSICAL EXAM:   General Appearance: Alert, cooperative, no distress, appears stated age  Head: Normocephalic, without obvious abnormality, atraumatic Eyes: PERRL, conjunctiva/corneas clear, EOM's intact,  Ears: Normal TM's and external ear canals Nose: Nares  normal, mucosa normal, no drainage or sinus   tenderness Throat: Lips, mucosa, and tongue normal; teeth and gums normal Neck: Supple, no lymphadenopathy, thyroid:no enlargement/tenderness/nodules; no carotid bruit or JVD Lungs: Clear to auscultation bilaterally without wheezes, rales or ronchi; respirations unlabored Heart: Regular rate and rhythm, S1 and S2 normal, no murmur, rub or gallop Abdomen: Soft, non-tender, nondistended, normoactive bowel sounds, no masses, no hepatosplenomegaly Extremities: No clubbing, cyanosis or edema Pulses: 2+ and symmetric all extremities Skin: Purpuric lesions noted on both forearms. Lymph nodes: Cervical, supraclavicular, and axillary nodes normal Neurologic: CNII-XII intact, normal strength, sensation and gait; reflexes 2+ and symmetric throughout   Psych: Normal mood, affect, hygiene and grooming  ASSESSMENT/PLAN: Routine general medical examination at a health care facility - Plan: CBC with Differential/Platelet, Comprehensive metabolic panel, Lipid panel  Benign prostatic hyperplasia, unspecified whether lower urinary tract symptoms present  Hyperlipidemia, unspecified hyperlipidemia type - Plan: Lipid panel  Seasonal allergic rhinitis due to pollen  Family history of heart disease in male family member before age 54 - Plan: Lipid panel  Upper airway cough syndrome  Senile purpura (HCC)  Gastroesophageal reflux disease with esophagitis without hemorrhage  Hernia, hiatal  Need for hepatitis C screening test - Plan: Hepatitis C antibody  History of colon polyps - Adenomatous He will follow up in 2023 for repeat colonoscopy.  Continue on present medication regimen.  recommended at least 30 minutes of aerobic activity at least 5 days/week;  Immunization recommendations discussed.  Recommend he get Tdap and his second Shingrix shot.  Colonoscopy recommendations reviewed.   Medicare Attestation I have personally reviewed: The patient's  medical and social history Their use of alcohol, tobacco or illicit drugs Their current medications and supplements The patient's functional ability including ADLs,fall risks, home safety risks, cognitive, and hearing and visual impairment Diet and physical activities Evidence for depression or mood disorders  The patient's weight, height, and BMI have been recorded in the chart.  I have made referrals, counseling, and provided education to the patient based on review of the above and I have provided the patient with a written personalized care plan for preventive services.     Sharlot Gowda, MD   08/31/2020

## 2020-08-31 NOTE — Patient Instructions (Signed)
  Logan Banks , Thank you for taking time to come for your Medicare Wellness Visit. I appreciate your ongoing commitment to your health goals. Please review the following plan we discussed and let me know if I can assist you in the future.   These are the goals we discussed: Goals   None     This is a list of the screening recommended for you and due dates:  Health Maintenance  Topic Date Due  .  Hepatitis C: One time screening is recommended by Center for Disease Control  (CDC) for  adults born from 18 through 1963.   Never done  . Tetanus Vaccine  08/29/2020  . Flu Shot  12/19/2020  . Colon Cancer Screening  06/21/2025  . COVID-19 Vaccine  Completed  . Pneumonia vaccines  Completed  . HPV Vaccine  Aged Out

## 2020-09-01 LAB — COMPREHENSIVE METABOLIC PANEL
ALT: 22 IU/L (ref 0–44)
AST: 20 IU/L (ref 0–40)
Albumin/Globulin Ratio: 1.3 (ref 1.2–2.2)
Albumin: 4.4 g/dL (ref 3.7–4.7)
Alkaline Phosphatase: 83 IU/L (ref 44–121)
BUN/Creatinine Ratio: 16 (ref 10–24)
BUN: 18 mg/dL (ref 8–27)
Bilirubin Total: 0.7 mg/dL (ref 0.0–1.2)
CO2: 23 mmol/L (ref 20–29)
Calcium: 9.3 mg/dL (ref 8.6–10.2)
Chloride: 99 mmol/L (ref 96–106)
Creatinine, Ser: 1.1 mg/dL (ref 0.76–1.27)
Globulin, Total: 3.4 g/dL (ref 1.5–4.5)
Glucose: 81 mg/dL (ref 65–99)
Potassium: 4.7 mmol/L (ref 3.5–5.2)
Sodium: 140 mmol/L (ref 134–144)
Total Protein: 7.8 g/dL (ref 6.0–8.5)
eGFR: 69 mL/min/{1.73_m2} (ref >59)

## 2020-09-01 LAB — LIPID PANEL
Chol/HDL Ratio: 3.4 ratio (ref 0.0–5.0)
Cholesterol, Total: 133 mg/dL (ref 100–199)
HDL: 39 mg/dL — ABNORMAL LOW (ref >39)
LDL Chol Calc (NIH): 81 mg/dL (ref 0–99)
Triglycerides: 64 mg/dL (ref 0–149)
VLDL Cholesterol Cal: 13 mg/dL (ref 5–40)

## 2020-09-01 LAB — CBC WITH DIFFERENTIAL/PLATELET
Basophils Absolute: 0 10*3/uL (ref 0.0–0.2)
Basos: 0 %
EOS (ABSOLUTE): 0 10*3/uL (ref 0.0–0.4)
Eos: 0 %
Hematocrit: 45.3 % (ref 37.5–51.0)
Hemoglobin: 15.1 g/dL (ref 13.0–17.7)
Immature Grans (Abs): 0.1 10*3/uL (ref 0.0–0.1)
Immature Granulocytes: 1 %
Lymphocytes Absolute: 0.9 10*3/uL (ref 0.7–3.1)
Lymphs: 9 %
MCH: 30.7 pg (ref 26.6–33.0)
MCHC: 33.3 g/dL (ref 31.5–35.7)
MCV: 92 fL (ref 79–97)
Monocytes Absolute: 1 10*3/uL — ABNORMAL HIGH (ref 0.1–0.9)
Monocytes: 10 %
Neutrophils Absolute: 8 10*3/uL — ABNORMAL HIGH (ref 1.4–7.0)
Neutrophils: 80 %
Platelets: 248 10*3/uL (ref 150–450)
RBC: 4.92 x10E6/uL (ref 4.14–5.80)
RDW: 12 % (ref 11.6–15.4)
WBC: 9.9 10*3/uL (ref 3.4–10.8)

## 2020-09-01 LAB — HEPATITIS C ANTIBODY: Hep C Virus Ab: <0.1 s/co ratio (ref 0.0–0.9)

## 2020-09-02 ENCOUNTER — Other Ambulatory Visit: Payer: Self-pay | Admitting: Family Medicine

## 2020-09-02 MED ORDER — AMOXICILLIN 500 MG PO CAPS: 500.0000 mg | ORAL_CAPSULE | Freq: Three times a day (TID) | ORAL | 0 refills | Status: DC

## 2020-09-13 ENCOUNTER — Other Ambulatory Visit: Payer: Self-pay | Admitting: Family Medicine

## 2020-09-13 DIAGNOSIS — E785 Hyperlipidemia, unspecified: Secondary | ICD-10-CM

## 2020-11-08 ENCOUNTER — Emergency Department (HOSPITAL_BASED_OUTPATIENT_CLINIC_OR_DEPARTMENT_OTHER): Payer: Medicare HMO | Admitting: Radiology

## 2020-11-08 ENCOUNTER — Emergency Department (HOSPITAL_BASED_OUTPATIENT_CLINIC_OR_DEPARTMENT_OTHER)
Admission: EM | Admit: 2020-11-08 | Discharge: 2020-11-08 | Disposition: A | Discharge status: Home / Self Care | Payer: Medicare HMO | Attending: Emergency Medicine | Admitting: Emergency Medicine

## 2020-11-08 ENCOUNTER — Encounter (HOSPITAL_BASED_OUTPATIENT_CLINIC_OR_DEPARTMENT_OTHER): Payer: Self-pay

## 2020-11-08 ENCOUNTER — Emergency Department (HOSPITAL_BASED_OUTPATIENT_CLINIC_OR_DEPARTMENT_OTHER)
Admission: EM | Admit: 2020-11-08 | Discharge: 2020-11-09 | Disposition: A | Discharge status: Home / Self Care | Payer: Medicare HMO | Source: Home / Self Care | Attending: Emergency Medicine | Admitting: Emergency Medicine

## 2020-11-08 ENCOUNTER — Other Ambulatory Visit: Payer: Self-pay

## 2020-11-08 DIAGNOSIS — Z87891 Personal history of nicotine dependence: Secondary | ICD-10-CM | POA: Insufficient documentation

## 2020-11-08 DIAGNOSIS — R109 Unspecified abdominal pain: Secondary | ICD-10-CM | POA: Insufficient documentation

## 2020-11-08 DIAGNOSIS — R52 Pain, unspecified: Secondary | ICD-10-CM

## 2020-11-08 DIAGNOSIS — W010XXA Fall on same level from slipping, tripping and stumbling without subsequent striking against object, initial encounter: Secondary | ICD-10-CM | POA: Insufficient documentation

## 2020-11-08 DIAGNOSIS — W19XXXA Unspecified fall, initial encounter: Secondary | ICD-10-CM | POA: Diagnosis not present

## 2020-11-08 DIAGNOSIS — K5900 Constipation, unspecified: Secondary | ICD-10-CM | POA: Insufficient documentation

## 2020-11-08 DIAGNOSIS — S29012A Strain of muscle and tendon of back wall of thorax, initial encounter: Secondary | ICD-10-CM | POA: Insufficient documentation

## 2020-11-08 DIAGNOSIS — Z7982 Long term (current) use of aspirin: Secondary | ICD-10-CM | POA: Insufficient documentation

## 2020-11-08 DIAGNOSIS — S3992XA Unspecified injury of lower back, initial encounter: Secondary | ICD-10-CM | POA: Diagnosis not present

## 2020-11-08 DIAGNOSIS — M545 Low back pain, unspecified: Secondary | ICD-10-CM | POA: Diagnosis not present

## 2020-11-08 DIAGNOSIS — M48061 Spinal stenosis, lumbar region without neurogenic claudication: Secondary | ICD-10-CM | POA: Diagnosis not present

## 2020-11-08 DIAGNOSIS — S39012A Strain of muscle, fascia and tendon of lower back, initial encounter: Secondary | ICD-10-CM | POA: Insufficient documentation

## 2020-11-08 DIAGNOSIS — R079 Chest pain, unspecified: Secondary | ICD-10-CM | POA: Diagnosis not present

## 2020-11-08 DIAGNOSIS — T148XXA Other injury of unspecified body region, initial encounter: Secondary | ICD-10-CM

## 2020-11-08 DIAGNOSIS — Y9389 Activity, other specified: Secondary | ICD-10-CM | POA: Insufficient documentation

## 2020-11-08 MED ORDER — KETOROLAC TROMETHAMINE 30 MG/ML IJ SOLN
15.0000 mg | Freq: Once | INTRAMUSCULAR | Status: AC
  Administered 2020-11-08: 15 mg via INTRAMUSCULAR
  Filled 2020-11-08: qty 1

## 2020-11-08 MED ORDER — METHOCARBAMOL 500 MG PO TABS: 500.0000 mg | ORAL_TABLET | Freq: Two times a day (BID) | ORAL | 0 refills | Status: AC

## 2020-11-08 NOTE — ED Triage Notes (Signed)
Patient here POV from Home with Back Pain.  Patient, on Friday, was carrying an [employer] and fell backwards onto back. Patient has been taking OTC medication which has been helping somewhat but today the pain has been worst.   Ambulatory, Personal Back Brace in Place, GCS 15. No Hx of Blood Thinners.

## 2020-11-08 NOTE — ED Provider Notes (Signed)
[facility] EMERGENCY DEPT Provider Note   CSN: [id number] Arrival date & time: 11/08/20  0051     History Chief Complaint  Patient presents with   Back Pain    Logan Banks is a 80 y.o. male.  The history is provided by the patient.  Back Pain Location:  Lumbar spine Quality:  Aching Radiates to:  Does not radiate Pain severity:  Moderate Onset quality:  Sudden Duration:  4 days Timing:  Intermittent Progression:  Worsening Chronicity:  New Context: falling and lifting heavy objects   Relieved by:  Cold packs Worsened by:  Ambulation Associated symptoms: no abdominal pain, no bladder incontinence, no bowel incontinence, no fever and no weakness   Risk factors: no recent surgery   Patient presents with back pain.  Patient reports that approximately 4 days ago he was lifting an air conditioner and he fell backwards onto his back.  No LOC or head injury.  Since that time he has had some pain in his low back, was worse tonight when he got out of bed.  No incontinence.  No leg weakness.  No previous history of back surgery He does not take anticoagulation    Past Medical History:  Diagnosis Date   BPH (benign prostatic hyperplasia)    Dysrhythmia    Frozen shoulder left   Hyperlipidemia    PONV (postoperative nausea and vomiting)     Patient Active Problem List   Diagnosis Date Noted   History of colon polyps 08/31/2020   GERD (gastroesophageal reflux disease) 03/17/2020   Hernia, hiatal 03/17/2020   Senile purpura (HCC) 01/14/2018   Upper airway cough syndrome 12/11/2017   Hyperlipidemia 10/23/2010   Allergic rhinitis due to pollen 10/23/2010   Family history of heart disease in male family member before age 74 10/23/2010   BPH (benign prostatic hyperplasia)     Past Surgical History:  Procedure Laterality Date   COLONOSCOPY  2009   [facility]   HERNIA REPAIR     INGUINAL HERNIA REPAIR Right 07/08/2017   Procedure: LAPAROSCOPIC RIGHT   INGUINAL HERNIA REPAIR;  Surgeon: Axel Filler, MD;  Location: [facility] OR;  Service: General;  Laterality: Right;   INSERTION OF MESH Right 07/08/2017   Procedure: INSERTION OF MESH;  Surgeon: Axel Filler, MD;  Location: [facility] OR;  Service: General;  Laterality: Right;   TRIGGER FINGER RELEASE Bilateral        Family History  Problem Relation Age of Onset   Coronary artery disease Other     Social History   Tobacco Use   Smoking status: Former    Packs/day: 0.50    Years: 8.00    Pack years: 4.00    Types: Cigarettes    Quit date: 05/21/1970    Years since quitting: 50.5   Smokeless tobacco: Never  Vaping Use   Vaping Use: Never used  Substance Use Topics   Alcohol use: Yes    Alcohol/week: 1.0 standard drink    Types: 1 Glasses of wine per week    Comment: rare   Drug use: No    Home Medications Prior to Admission medications   Medication Sig Start Date End Date Taking? Authorizing Provider  acetaminophen (TYLENOL) 500 MG tablet Take 1,000 mg by mouth daily as needed for moderate pain or headache.    [provider]  albuterol (VENTOLIN HFA) 108 (90 Base) MCG/ACT inhaler TAKE 2 PUFFS BY MOUTH EVERY 6 HOURS AS NEEDED FOR WHEEZE OR SHORTNESS OF BREATH 02/24/19  Ronnald Nian, MD  ALPRAZolam Prudy Feeler) 0.25 MG tablet Take 1 tablet (0.25 mg total) by mouth daily as needed for anxiety. Patient not taking: No sig reported 07/31/19   Ronnald Nian, MD  amoxicillin (AMOXIL) 500 MG capsule Take 1 capsule (500 mg total) by mouth 3 (three) times daily. 09/02/20   Ronnald Nian, MD  APPLE CIDER VINEGAR PO Take by mouth. Patient not taking: No sig reported    [provider]  aspirin EC 81 MG tablet Take 81 mg by mouth daily.    [provider]  atorvastatin (LIPITOR) 10 MG tablet TAKE 1 TABLET BY MOUTH EVERY DAY 09/13/20   Ronnald Nian, MD  chlorhexidine (PERIDEX) 0.12 % solution SMARTSIG:15 Milliliter(s) By Mouth Morning-Night 07/15/19   [provider]  Coenzyme Q10 (CO Q 10 PO) Take by mouth.    [provider]  guaiFENesin (MUCINEX) 600 MG 12 hr tablet Take 600 mg by mouth 2 (two) times daily.    [provider]  Multiple Vitamins-Minerals (MULTIVITAMIN WITH MINERALS) tablet Take 1 tablet by mouth daily.    [provider]  Omega-3 Fatty Acids (FISH OIL) 1200 MG CAPS Take 1,200 mg by mouth daily.    [provider]  omeprazole (PRILOSEC) 40 MG capsule 1 cap(s) 03/15/20   [provider]  tadalafil (CIALIS) 20 MG tablet Take 1 tablet (20 mg total) by mouth daily as needed for erectile dysfunction. 07/29/19   Ronnald Nian, MD  trimethoprim-polymyxin b (POLYTRIM) ophthalmic solution Place 1 drop into the left eye every 4 (four) hours. Patient not taking: No sig reported 04/24/19   Hetty Blend L, NP-C  Zinc Gluconate (ZINC COLD THERAPY PO) Take 35 mg by mouth.    [provider]    Allergies    Codeine  Review of Systems   Review of Systems  Constitutional:  Negative for fever.  Gastrointestinal:  Negative for abdominal pain and bowel incontinence.  Genitourinary:  Negative for bladder incontinence.  Musculoskeletal:  Positive for back pain.  Neurological:  Negative for weakness.  All other systems reviewed and are negative.  Physical Exam Updated Vital Signs BP (!) 167/73 (BP Location: Right Arm)   Pulse 65   Temp 98.2 F (36.8 C) (Oral)   Resp 16   Ht 1.702 m (5\' 7" )   Wt 74.4 kg   SpO2 100%   BMI 25.69 kg/m   Physical Exam CONSTITUTIONAL: Well developed/well nourished HEAD: Normocephalic/atraumatic EYES: EOMI/PERRL ENMT: Mucous membranes moist NECK: supple no meningeal signs SPINE/BACK:entire spine nontender mild lumbar paraspinal tenderness, no bruising/crepitance/stepoffs noted to spine CV: S1/S2 noted, no murmurs/rubs/gallops noted LUNGS: Lungs are clear to auscultation bilaterally, no apparent distress Chest-mild tenderness to R posterior  chest, no crepitus or bruising ABDOMEN: soft, nontender, no rebound or guarding GU:no cva tenderness, no bruising noted NEURO: Awake/alert, equal motor 5/5 strength noted with the following: hip flexion/knee flexion/extension, foot dorsi/plantar flexion, great toe extension intact bilaterally, no sensory deficit in any dermatome. Pt is able to ambulate unassisted. EXTREMITIES: pulses normal, full ROM SKIN: warm, color normal no bruising noted to the back PSYCH: no abnormalities of mood noted, alert and oriented to situation  ED Results / Procedures / Treatments   Labs (all labs ordered are listed, but only abnormal results are displayed) Labs Reviewed - No data to display  EKG None  Radiology DG Chest 2 View  Result Date: 11/08/2020 CLINICAL DATA:  Pain.  Back pain. EXAM: CHEST - 2  VIEW COMPARISON:  Chest x-ray 06/23/2011 FINDINGS: The heart size and mediastinal contours are unchanged. Atherosclerotic plaque. Left base linear atelectasis versus scarring again noted. No focal consolidation. No pulmonary edema. No pleural effusion. No pneumothorax. No acute osseous abnormality. Age-indeterminate anterior wedge deformity of the T12 vertebral body. IMPRESSION: 1. No active cardiopulmonary disease. 2. Age-indeterminate anterior wedge deformity of the T12 vertebral body. Correlate with point tenderness to palpation for an acute component. 3.  Aortic Atherosclerosis (ICD10-I70.0). Electronically Signed   By: Tish Frederickson M.D.   On: 11/08/2020 02:20   DG Lumbar Spine Complete  Result Date: 11/08/2020 CLINICAL DATA:  Pain. EXAM: LUMBAR SPINE - COMPLETE 4+ VIEW COMPARISON:  None. FINDINGS: Limited evaluation due to overlying bowel and stool. Five non-rib-bearing lumbar vertebral bodies. Multilevel degenerative changes of the spine. Intervertebral disc space narrowing in the lower lumbar vertebral bodies. Anterior wedge compression fracture of the T12 vertebral body. There is no evidence of lumbar  spine fracture. Alignment is normal. Skin staples overlying the pelvis.  Aortic calcifications. IMPRESSION: 1. No acute displaced fracture or traumatic listhesis of the lumbar spine in a patient with multilevel degenerative changes. 2. Age-indeterminate anterior wedge compression fracture of the T12 vertebral body. Correlate with tenderness to palpation for an acute component. 3. Markedly limited evaluation due to overlying bowel and stool. 4.  Aortic Atherosclerosis (ICD10-I70.0). Electronically Signed   By: Tish Frederickson M.D.   On: 11/08/2020 02:18    Procedures Procedures   Medications Ordered in ED Medications  ketorolac (TORADOL) 30 MG/ML injection 15 mg (15 mg Intramuscular Given 11/08/20 0112)    ED Course  I have reviewed the triage vital signs and the nursing notes.  Pertinent imaging results that were available during my care of the patient were reviewed by me and considered in my medical decision making (see chart for details).    MDM Rules/Calculators/A&P                          Patient presents several days after accidental fall.  He mostly had pain in the paraspinal region and some posterior rib tenderness.  No acute rib injury on x-ray.  No lumbar fracture noted. Patient has an age-indeterminate compression fracture that is likely not acute at T12 Plan for discharge home.  Patient is interested in trying  muscle relaxant Overall patient is well, appearing in no acute discharge Plan for discharge  Final Clinical Impression(s) / ED Diagnoses Final diagnoses:  Muscle strain    Rx / DC Orders ED Discharge Orders          Ordered    methocarbamol (ROBAXIN) 500 MG tablet  2 times daily        11/08/20 0205             Zadie Rhine, MD 11/08/20 0230

## 2020-11-08 NOTE — Discharge Instructions (Signed)

## 2020-11-09 ENCOUNTER — Encounter (HOSPITAL_BASED_OUTPATIENT_CLINIC_OR_DEPARTMENT_OTHER): Payer: Self-pay | Admitting: *Deleted

## 2020-11-09 ENCOUNTER — Other Ambulatory Visit: Payer: Self-pay

## 2020-11-09 ENCOUNTER — Emergency Department (HOSPITAL_BASED_OUTPATIENT_CLINIC_OR_DEPARTMENT_OTHER): Payer: Medicare HMO

## 2020-11-09 DIAGNOSIS — M545 Low back pain, unspecified: Secondary | ICD-10-CM | POA: Diagnosis not present

## 2020-11-09 DIAGNOSIS — R109 Unspecified abdominal pain: Secondary | ICD-10-CM | POA: Diagnosis not present

## 2020-11-09 LAB — URINALYSIS, ROUTINE W REFLEX MICROSCOPIC
Bilirubin Urine: NEGATIVE
Glucose, UA: NEGATIVE mg/dL
Hgb urine dipstick: NEGATIVE
Ketones, ur: NEGATIVE mg/dL
Leukocytes,Ua: NEGATIVE
Nitrite: NEGATIVE
Protein, ur: NEGATIVE mg/dL
Specific Gravity, Urine: 1.016 (ref 1.005–1.030)
pH: 6 (ref 5.0–8.0)

## 2020-11-09 LAB — CBC WITH DIFFERENTIAL/PLATELET
Abs Immature Granulocytes: 0.03 10*3/uL (ref 0.00–0.07)
Basophils Absolute: 0 10*3/uL (ref 0.0–0.1)
Basophils Relative: 0 %
Eosinophils Absolute: 0.2 10*3/uL (ref 0.0–0.5)
Eosinophils Relative: 2 %
HCT: 41.2 % (ref 39.0–52.0)
Hemoglobin: 13.9 g/dL (ref 13.0–17.0)
Immature Granulocytes: 0 %
Lymphocytes Relative: 12 %
Lymphs Abs: 0.9 10*3/uL (ref 0.7–4.0)
MCH: 30.8 pg (ref 26.0–34.0)
MCHC: 33.7 g/dL (ref 30.0–36.0)
MCV: 91.4 fL (ref 80.0–100.0)
Monocytes Absolute: 0.6 10*3/uL (ref 0.1–1.0)
Monocytes Relative: 8 %
Neutro Abs: 5.4 10*3/uL (ref 1.7–7.7)
Neutrophils Relative %: 78 %
Platelets: 202 10*3/uL (ref 150–400)
RBC: 4.51 MIL/uL (ref 4.22–5.81)
RDW: 12.5 % (ref 11.5–15.5)
WBC: 7 10*3/uL (ref 4.0–10.5)
nRBC: 0 % (ref 0.0–0.2)

## 2020-11-09 LAB — BASIC METABOLIC PANEL
Anion gap: 7 (ref 5–15)
BUN: 21 mg/dL (ref 8–23)
CO2: 27 mmol/L (ref 22–32)
Calcium: 9.5 mg/dL (ref 8.9–10.3)
Chloride: 105 mmol/L (ref 98–111)
Creatinine, Ser: 1.01 mg/dL (ref 0.61–1.24)
GFR, Estimated: >60 mL/min (ref >60)
Glucose, Bld: 104 mg/dL — ABNORMAL HIGH (ref 70–99)
Potassium: 3.9 mmol/L (ref 3.5–5.1)
Sodium: 139 mmol/L (ref 135–145)

## 2020-11-09 MED ORDER — KETOROLAC TROMETHAMINE 30 MG/ML IJ SOLN
15.0000 mg | Freq: Once | INTRAMUSCULAR | Status: AC
  Administered 2020-11-09: 15 mg via INTRAVENOUS
  Filled 2020-11-09: qty 1

## 2020-11-09 MED ORDER — FENTANYL CITRATE (PF) 100 MCG/2ML IJ SOLN
100.0000 ug | Freq: Once | INTRAMUSCULAR | Status: AC
  Administered 2020-11-09: 100 ug via INTRAVENOUS
  Filled 2020-11-09: qty 2

## 2020-11-09 MED ORDER — FENTANYL CITRATE (PF) 100 MCG/2ML IJ SOLN
50.0000 ug | Freq: Once | INTRAMUSCULAR | Status: AC
  Administered 2020-11-09: 50 ug via INTRAVENOUS
  Filled 2020-11-09: qty 2

## 2020-11-09 MED ORDER — HYDROCODONE-ACETAMINOPHEN 5-325 MG PO TABS: 1.0000 | ORAL_TABLET | Freq: Four times a day (QID) | ORAL | 0 refills | Status: AC | PRN

## 2020-11-09 MED ORDER — ONDANSETRON HCL 4 MG/2ML IJ SOLN
4.0000 mg | Freq: Once | INTRAMUSCULAR | Status: AC
  Administered 2020-11-09: 4 mg via INTRAVENOUS
  Filled 2020-11-09: qty 2

## 2020-11-09 MED ORDER — DOCUSATE SODIUM 100 MG PO CAPS: 100.0000 mg | ORAL_CAPSULE | Freq: Two times a day (BID) | ORAL | 0 refills | Status: AC

## 2020-11-09 NOTE — Discharge Instructions (Addendum)

## 2020-11-09 NOTE — ED Triage Notes (Addendum)
Pt c/o fall on Friday morning for a fall. States he was carrying an air conditioner when he lost his balance and fell backwards. Pt states he was seen here yesterday morning for same symptoms. States he was given a "shot" and RX for muscle relaxer. States pain is not improving. Pain to right posterior mid back and flank area. Denies any blood in urine.

## 2020-11-09 NOTE — ED Provider Notes (Signed)
MEDCENTER [facility] EMERGENCY DEPT Provider Note   CSN: [id number] Arrival date & time: 11/08/20  2352     History Chief Complaint  Patient presents with   Back Pain    Keona Sheffler is a 80 y.o. male.  The history is provided by the patient.  Flank Pain This is a recurrent problem. The current episode started more than 2 days ago. The problem occurs constantly. The problem has been gradually worsening. Pertinent negatives include no chest pain, no abdominal pain and no shortness of breath. The symptoms are aggravated by walking. Nothing relieves the symptoms. Treatments tried: Muscle relaxant. The treatment provided no relief.     Patient presents with continued back and flank pain. Patient reports he had an accidental fall several days ago where he landed on his back.  Since that time has been having pain in that area.  He was seen in the emergency department yesterday and had negative x-rays and was discharged.  He started a muscle relaxant but his pain worsened tonight.  No new trauma No leg weakness.  No incontinence.  No difficulty urinating.  Past Medical History:  Diagnosis Date   BPH (benign prostatic hyperplasia)    Dysrhythmia    Frozen shoulder left   Hyperlipidemia    PONV (postoperative nausea and vomiting)     Patient Active Problem List   Diagnosis Date Noted   History of colon polyps 08/31/2020   GERD (gastroesophageal reflux disease) 03/17/2020   Hernia, hiatal 03/17/2020   Senile purpura (HCC) 01/14/2018   Upper airway cough syndrome 12/11/2017   Hyperlipidemia 10/23/2010   Allergic rhinitis due to pollen 10/23/2010   Family history of heart disease in male family member before age 74 10/23/2010   BPH (benign prostatic hyperplasia)     Past Surgical History:  Procedure Laterality Date   COLONOSCOPY  2009   MEDOFF   HERNIA REPAIR     INGUINAL HERNIA REPAIR Right 07/08/2017   Procedure: LAPAROSCOPIC RIGHT  INGUINAL HERNIA REPAIR;   Surgeon: Axel Filler, MD;  Location: [facility] OR;  Service: General;  Laterality: Right;   INSERTION OF MESH Right 07/08/2017   Procedure: INSERTION OF MESH;  Surgeon: Axel Filler, MD;  Location: [facility] OR;  Service: General;  Laterality: Right;   TRIGGER FINGER RELEASE Bilateral        Family History  Problem Relation Age of Onset   Coronary artery disease Other     Social History   Tobacco Use   Smoking status: Former    Packs/day: 0.50    Years: 8.00    Pack years: 4.00    Types: Cigarettes    Quit date: 05/21/1970    Years since quitting: 50.5   Smokeless tobacco: Never  Vaping Use   Vaping Use: Never used  Substance Use Topics   Alcohol use: Yes    Alcohol/week: 1.0 standard drink    Types: 1 Glasses of wine per week    Comment: rare   Drug use: No    Home Medications Prior to Admission medications   Medication Sig Start Date End Date Taking? Authorizing Provider  docusate sodium (COLACE) 100 MG capsule Take 1 capsule (100 mg total) by mouth every 12 (twelve) hours. 11/09/20  Yes Zadie Rhine, MD  HYDROcodone-acetaminophen (NORCO/VICODIN) 5-325 MG tablet Take 1 tablet by mouth every 6 (six) hours as needed for severe pain. 11/09/20  Yes Zadie Rhine, MD  acetaminophen (TYLENOL) 500 MG tablet Take 1,000 mg by mouth daily as needed for moderate  pain or headache.    [provider]  albuterol (VENTOLIN HFA) 108 (90 Base) MCG/ACT inhaler TAKE 2 PUFFS BY MOUTH EVERY 6 HOURS AS NEEDED FOR WHEEZE OR SHORTNESS OF BREATH 02/24/19   Ronnald Nian, MD  ALPRAZolam Prudy Feeler) 0.25 MG tablet Take 1 tablet (0.25 mg total) by mouth daily as needed for anxiety. Patient not taking: No sig reported 07/31/19   Ronnald Nian, MD  APPLE CIDER VINEGAR PO Take by mouth. Patient not taking: No sig reported    [provider]  aspirin EC 81 MG tablet Take 81 mg by mouth daily.    [provider]  atorvastatin (LIPITOR) 10 MG tablet TAKE 1 TABLET BY MOUTH EVERY  DAY 09/13/20   Ronnald Nian, MD  chlorhexidine (PERIDEX) 0.12 % solution SMARTSIG:15 Milliliter(s) By Mouth Morning-Night 07/15/19   [provider]  Coenzyme Q10 (CO Q 10 PO) Take by mouth.    [provider]  guaiFENesin (MUCINEX) 600 MG 12 hr tablet Take 600 mg by mouth 2 (two) times daily.    [provider]  methocarbamol (ROBAXIN) 500 MG tablet Take 1 tablet (500 mg total) by mouth 2 (two) times daily. 11/08/20   Zadie Rhine, MD  Multiple Vitamins-Minerals (MULTIVITAMIN WITH MINERALS) tablet Take 1 tablet by mouth daily.    [provider]  Omega-3 Fatty Acids (FISH OIL) 1200 MG CAPS Take 1,200 mg by mouth daily.    [provider]  omeprazole (PRILOSEC) 40 MG capsule 1 cap(s) 03/15/20   [provider]  tadalafil (CIALIS) 20 MG tablet Take 1 tablet (20 mg total) by mouth daily as needed for erectile dysfunction. 07/29/19   Ronnald Nian, MD  trimethoprim-polymyxin b (POLYTRIM) ophthalmic solution Place 1 drop into the left eye every 4 (four) hours. Patient not taking: No sig reported 04/24/19   Hetty Blend L, NP-C  Zinc Gluconate (ZINC COLD THERAPY PO) Take 35 mg by mouth.    [provider]    Allergies    Codeine  Review of Systems   Review of Systems  Respiratory:  Negative for shortness of breath.   Cardiovascular:  Negative for chest pain.  Gastrointestinal:  Negative for abdominal pain.  Genitourinary:  Positive for flank pain.  Skin:  Negative for color change and rash.  Neurological:  Negative for weakness.  All other systems reviewed and are negative.  Physical Exam Updated Vital Signs BP (!) 113/54 (BP Location: Left Arm)   Pulse 63   Temp 98.4 F (36.9 C) (Oral)   Resp 17   Ht 1.702 m (5\' 7" )   Wt 74.4 kg   SpO2 99%   BMI 25.69 kg/m   Physical Exam CONSTITUTIONAL: Well developed/well nourished HEAD: Normocephalic/atraumatic EYES: EOMI/PERRL ENMT: Mucous membranes moist NECK: supple no  meningeal signs SPINE/BACK:entire spine nontender no bruising/crepitance/stepoffs noted to spine CV: S1/S2 noted, no murmurs/rubs/gallops noted LUNGS: Lungs are clear to auscultation bilaterally, no apparent distress ABDOMEN: soft, nontender, no rebound or guarding GU: Mild tenderness over right flank, no bruising or rash noted NEURO: Awake/alert,  equal motor 5/5 strength noted with the following: hip flexion/knee flexion/extension, foot dorsi/plantar flexion, great toe extension intact bilaterally, no clonus bilaterally,  no sensory deficit in any dermatome.  Equal patellar/achilles reflex noted (2+) in bilateral lower extremities.  Pt is able to ambulate unassisted. EXTREMITIES: pulses normal, full ROM SKIN: warm, color normal PSYCH: no abnormalities of mood noted, alert and oriented to situation  ED Results / Procedures /  Treatments   Labs (all labs ordered are listed, but only abnormal results are displayed) Labs Reviewed  BASIC METABOLIC PANEL - Abnormal; Notable for the following components:      Result Value   Glucose, Bld 104 (*)    All other components within normal limits  CBC WITH DIFFERENTIAL/PLATELET  URINALYSIS, ROUTINE W REFLEX MICROSCOPIC    EKG None  Radiology DG Chest 2 View  Result Date: 11/08/2020 CLINICAL DATA:  Pain.  Back pain. EXAM: CHEST - 2 VIEW COMPARISON:  Chest x-ray 06/23/2011 FINDINGS: The heart size and mediastinal contours are unchanged. Atherosclerotic plaque. Left base linear atelectasis versus scarring again noted. No focal consolidation. No pulmonary edema. No pleural effusion. No pneumothorax. No acute osseous abnormality. Age-indeterminate anterior wedge deformity of the T12 vertebral body. IMPRESSION: 1. No active cardiopulmonary disease. 2. Age-indeterminate anterior wedge deformity of the T12 vertebral body. Correlate with point tenderness to palpation for an acute component. 3.  Aortic Atherosclerosis (ICD10-I70.0). Electronically Signed   By:  Tish FredericksonMorgane  Naveau M.D.   On: 11/08/2020 02:20   DG Lumbar Spine Complete  Result Date: 11/08/2020 CLINICAL DATA:  Pain. EXAM: LUMBAR SPINE - COMPLETE 4+ VIEW COMPARISON:  None. FINDINGS: Limited evaluation due to overlying bowel and stool. Five non-rib-bearing lumbar vertebral bodies. Multilevel degenerative changes of the spine. Intervertebral disc space narrowing in the lower lumbar vertebral bodies. Anterior wedge compression fracture of the T12 vertebral body. There is no evidence of lumbar spine fracture. Alignment is normal. Skin staples overlying the pelvis.  Aortic calcifications. IMPRESSION: 1. No acute displaced fracture or traumatic listhesis of the lumbar spine in a patient with multilevel degenerative changes. 2. Age-indeterminate anterior wedge compression fracture of the T12 vertebral body. Correlate with tenderness to palpation for an acute component. 3. Markedly limited evaluation due to overlying bowel and stool. 4.  Aortic Atherosclerosis (ICD10-I70.0). Electronically Signed   By: Tish FredericksonMorgane  Naveau M.D.   On: 11/08/2020 02:18   CT L-SPINE NO CHARGE  Result Date: 11/09/2020 CLINICAL DATA:  Initial evaluation for acute flank pain, fall. EXAM: CT LUMBAR SPINE WITHOUT CONTRAST TECHNIQUE: Multidetector CT imaging of the lumbar spine was performed without intravenous contrast administration. Multiplanar CT image reconstructions were also generated. COMPARISON:  Prior radiograph from 11/08/2020. FINDINGS: Segmentation: Standard. Lowest well-formed disc space labeled the L5-S1 level. Alignment: Mild levoscoliosis. Alignment otherwise normal with preservation of the normal lumbar lordosis. No listhesis. Vertebrae: Minimal chronic wedging deformity involving the superior endplate of T12. Otherwise, vertebral body height maintained with no other acute or chronic fracture. Visualized sacrum and pelvis intact. SI joints symmetric and normal. No discrete or worrisome osseous lesions. Paraspinal and other  soft tissues: Paraspinous soft tissues demonstrate no acute finding. Aorto bi-iliac atherosclerotic disease noted. Disc levels: L1-2:  Unremarkable. L2-3: Degenerative intervertebral disc space narrowing with mild diffuse disc bulge. Chronic endplate Schmorl's node deformity. Mild facet hypertrophy. No significant spinal stenosis. Foramina remain patent. L3-4: Degenerative intervertebral disc space narrowing with mild diffuse disc bulge. Superimposed right foraminal to extraforaminal disc protrusion contacts the exiting right L3 nerve root (series 9, image 71). Mild facet hypertrophy. Resultant mild spinal stenosis. Mild to moderate left with moderate right L3 foraminal stenosis. L4-5: Degenerative intervertebral disc space narrowing with mild disc bulge. Associated minimal reactive endplate change. Mild facet hypertrophy. Resultant mild canal with bilateral lateral recess stenosis. Mild to moderate bilateral L4 foraminal stenosis. L5-S1: Degenerative intervertebral disc space narrowing with disc desiccation and mild disc bulge. Mild facet hypertrophy. No significant spinal stenosis. Moderate bilateral  L5 foraminal narrowing. IMPRESSION: 1. No acute traumatic injury within the lumbar spine. 2. Minimal chronic wedging deformity involving the superior endplate of T12, corresponding with finding on prior radiograph. 3. Right foraminal to extraforaminal disc protrusion at L3-4, contacting and potentially affecting the exiting right L3 nerve root. 4. Multifactorial degenerative changes at L3-4 and L4-5 with resultant mild spinal stenosis. 5. Moderate bilateral L3 and L4 and L5 foraminal stenosis related to disc bulge, reactive endplate changes, and facet hypertrophy. Aortic Atherosclerosis (ICD10-I70.0). Electronically Signed   By: Rise Mu M.D.   On: 11/09/2020 02:29   CT Renal Stone Study  Result Date: 11/09/2020 CLINICAL DATA:  Status post fall. Flank pain. Kidney stone suspected. EXAM: CT ABDOMEN AND  PELVIS WITHOUT CONTRAST TECHNIQUE: Multidetector CT imaging of the abdomen and pelvis was performed following the standard protocol without IV contrast. COMPARISON:  None. FINDINGS: Visualized lower thorax: No acute abnormality.  Tiny hiatal hernia. Liver: Not enlarged. No focal lesion. Biliary System: The gallbladder is otherwise unremarkable with no radio-opaque gallstones. No biliary ductal dilatation. Pancreas: Normal pancreatic contour. No main pancreatic duct dilatation. Spleen: Not enlarged. No focal lesion. A splenule is noted. Adrenal Glands: No nodularity bilaterally. Kidneys: Nonspecific bilateral perinephric stranding. No hydroureteronephrosis. 4 mm calcified stone within the left inferior renal pole. No right nephrolithiasis. No ureterolithiasis. No contour deforming renal mass. The urinary bladder is unremarkable. Bowel: No small or large bowel wall thickening or dilatation. Increased stool burden throughout the colon with fecalized material within multiple loops small bowel suggestive of a slow transition state. The appendix is unremarkable. Mesentery, Omentum, and Peritoneum: Surgical clips within the anterior right pelvis with question right inguinal surgical changes. No simple free fluid ascites. No pneumoperitoneum. No mesenteric hematoma identified. No organized fluid collection. Pelvic Organs: Normal. Lymph Nodes: No abdominal, pelvic, inguinal lymphadenopathy. Vasculature: Atherosclerotic plaque of the aorta and its branches. No abdominal aorta or iliac aneurysm. Musculoskeletal: No significant soft tissue hematoma. Small fat containing left inguinal hernia. No acute pelvic fracture. No spinal fracture. Multilevel degenerative changes of the spine. IMPRESSION: 1. Marked constipation with fecalization of multiple loops of small bowel. No bowel obstruction. 2. No acute traumatic injury to the abdomen, or pelvis with limited evaluation on this noncontrast study. 3. No acute fracture or traumatic  malalignment of the lumbar spine. 4. Other imaging findings of potential clinical significance: Tiny hiatal hernia. Nonobstructive 4 mm left nephrolithiasis. Aortic Atherosclerosis (ICD10-I70.0). Small fat containing left inguinal hernia. Electronically Signed   By: Tish Frederickson M.D.   On: 11/09/2020 02:42    Procedures Procedures   Medications Ordered in ED Medications  fentaNYL (SUBLIMAZE) injection 50 mcg (50 mcg Intravenous Given 11/09/20 0114)  ondansetron (ZOFRAN) injection 4 mg (4 mg Intravenous Given 11/09/20 0114)  ketorolac (TORADOL) 30 MG/ML injection 15 mg (15 mg Intravenous Given 11/09/20 0229)  ondansetron (ZOFRAN) injection 4 mg (4 mg Intravenous Given 11/09/20 0226)  fentaNYL (SUBLIMAZE) injection 100 mcg (100 mcg Intravenous Given 11/09/20 0229)    ED Course  I have reviewed the triage vital signs and the nursing notes.  Pertinent labs & imaging results that were available during my care of the patient were reviewed by me and considered in my medical decision making (see chart for details).    MDM Rules/Calculators/A&P                          Patient presents for repeat ER visit.  Seen yesterday in the ER for  similar type pain and had x-rays that only revealed likely nonacute T12 compression fracture  Due to persistent symptoms and age, will expand work-up to include labs, urinalysis and CT renal stone 3:01 AM Imaging reveals likely chronic findings.  No acute fracture.  Constipation was noted on CT but patient denies abdominal pain or left renal stone was noted.  Spinal stenosis of degenerative changes Patient has no focal leg weakness or any radiculopathy. Overall patient appears improved.  Still strongly suspicious that this represents muscle strain/spasm given pain is worse in the morning and improves with movement. He reports Robaxin did not really help the symptoms.  We will give short course of Vicodin and add on a stool softener  Overall patient is appropriate  for discharge home.  He is watching television and appears comfortable. Final Clinical Impression(s) / ED Diagnoses Final diagnoses:  Muscle strain of upper back  Constipation, unspecified constipation type  Spinal stenosis of lumbar region without neurogenic claudication    Rx / DC Orders ED Discharge Orders          Ordered    HYDROcodone-acetaminophen (NORCO/VICODIN) 5-325 MG tablet  Every 6 hours PRN        11/09/20 0259    docusate sodium (COLACE) 100 MG capsule  Every 12 hours        11/09/20 0259             Zadie Rhine, MD 11/09/20 [phone]

## 2020-11-10 ENCOUNTER — Ambulatory Visit (INDEPENDENT_AMBULATORY_CARE_PROVIDER_SITE_OTHER): Payer: Medicare HMO | Admitting: Family Medicine

## 2020-11-10 VITALS — BP 104/72 | HR 71 | Temp 97.4°F | Wt 166.6 lb

## 2020-11-10 DIAGNOSIS — Z9181 History of falling: Secondary | ICD-10-CM

## 2020-11-10 DIAGNOSIS — N200 Calculus of kidney: Secondary | ICD-10-CM | POA: Diagnosis not present

## 2020-11-10 DIAGNOSIS — I700 Atherosclerosis of aorta: Secondary | ICD-10-CM | POA: Diagnosis not present

## 2020-11-10 DIAGNOSIS — M7918 Myalgia, other site: Secondary | ICD-10-CM

## 2020-11-10 DIAGNOSIS — Z87442 Personal history of urinary calculi: Secondary | ICD-10-CM | POA: Insufficient documentation

## 2020-11-10 NOTE — Progress Notes (Signed)
   Subjective:    Patient ID: Logan Banks, male    DOB: Mar 05, 1941, 80 y.o.   MRN: [id number]  HPI He fell while carrying an air conditioner up the stairs last Friday he landed on his back in the twisted type position.  He was able to continue to do his ADLs that day without pain however the next day he woke up in pain.  He was seen in the emergency room twice because of the pain.  He was given a muscle relaxer and finally given codeine.  He states that he is now feeling much better only having slight discomfort.   Review of Systems     Objective:   Physical Exam Alert and in no distress full motion of his back.  No palpable tenderness over the spine or over the rib area. The emergency room record was reviewed including x-rays.  It did show evidence of a renal stone and also aortic atherosclerosis.  He presently is on a statin drug.      Assessment & Plan:  History of recent fall  Musculoskeletal pain  Aortic atherosclerosis (HCC)  Renal stone Recommend to use 2 Aleve twice per day for pain relief and codeine if he needs it. No intervention or further evaluation needed at the present time for the stone.  He was comfortable with that. I explained the idea behind aortic atherosclerosis and statin.  He will continue on his statin.

## 2020-12-04 ENCOUNTER — Other Ambulatory Visit: Payer: Self-pay | Admitting: Family Medicine

## 2020-12-04 DIAGNOSIS — E785 Hyperlipidemia, unspecified: Secondary | ICD-10-CM

## 2020-12-04 DIAGNOSIS — N529 Male erectile dysfunction, unspecified: Secondary | ICD-10-CM

## 2021-01-01 ENCOUNTER — Other Ambulatory Visit: Payer: Self-pay | Admitting: Family Medicine

## 2021-01-02 NOTE — Telephone Encounter (Signed)
[facility] is requesting to fill pt xanax. Please advise [facility] 

## 2021-01-26 ENCOUNTER — Telehealth (INDEPENDENT_AMBULATORY_CARE_PROVIDER_SITE_OTHER): Payer: Medicare HMO | Admitting: Family Medicine

## 2021-01-26 ENCOUNTER — Encounter: Payer: Self-pay | Admitting: Family Medicine

## 2021-01-26 ENCOUNTER — Other Ambulatory Visit: Payer: Self-pay

## 2021-01-26 VITALS — Temp 99.5°F | Wt 166.0 lb

## 2021-01-26 DIAGNOSIS — U071 COVID-19: Secondary | ICD-10-CM

## 2021-01-26 MED ORDER — NIRMATRELVIR/RITONAVIR (PAXLOVID)TABLET: 3.0000 | ORAL_TABLET | Freq: Two times a day (BID) | ORAL | 0 refills | Status: AC

## 2021-01-26 NOTE — Progress Notes (Signed)
   Subjective:    Patient ID: Logan Banks, male    DOB: 28-Feb-1941, 80 y.o.   MRN: [id number]  HPI Documentation for virtual audio and video telecommunications through [place] encounter: The patient was located at home. 2 patient identifiers used.  The provider was located in the office. The patient did consent to this visit and is aware of possible charges through their insurance for this visit. The other persons participating in this telemedicine service were none. Time spent on call was 5 minutes and in review of previous records >15 minutes total for counseling and coordination of care. This virtual service is not related to other E/M service within previous 7 days.  2 days ago he noted the onset of fever, rhinorrhea, subsequently having difficulty with chills and coughing as well as headache.  He was tested today and is positive for COVID.  He has had his 4 vaccines.  Review of Systems     Objective:   Physical Exam Alert and in no distress and does not appear toxic.       Assessment & Plan:  COVID-19 - Plan: nirmatrelvir/ritonavir EUA (PAXLOVID) 20 x 150 MG & 10 x 100MG  TABS He certainly qualifies for the antiviral medication.  Discussed the need for him to stay hydrated, use Tylenol for fever aches and pains as well as cough meds on an as-needed basis.  He will call if he gets worse.

## 2021-03-02 ENCOUNTER — Other Ambulatory Visit: Payer: Self-pay | Admitting: Family Medicine

## 2021-03-02 DIAGNOSIS — E785 Hyperlipidemia, unspecified: Secondary | ICD-10-CM

## 2021-03-13 ENCOUNTER — Telehealth: Payer: Self-pay

## 2021-03-13 ENCOUNTER — Other Ambulatory Visit: Payer: Self-pay | Admitting: Family Medicine

## 2021-03-13 DIAGNOSIS — R937 Abnormal findings on diagnostic imaging of other parts of musculoskeletal system: Secondary | ICD-10-CM

## 2021-03-13 NOTE — Telephone Encounter (Signed)
Pt was called to  advised that Dr. Susann Givens has put in a xray order. Pt was also advised that he ca go to any [place] imaging to have this done [facility]

## 2021-03-13 NOTE — Progress Notes (Signed)
I received a message from Dr. Velda Shell DDS concerning the Panorex that showed opacities at the C2 level.  We will start with a C-spine x-ray and proceed on from there.

## 2021-03-22 ENCOUNTER — Other Ambulatory Visit: Payer: Medicare HMO

## 2021-03-23 ENCOUNTER — Other Ambulatory Visit (INDEPENDENT_AMBULATORY_CARE_PROVIDER_SITE_OTHER): Payer: Medicare HMO

## 2021-03-23 ENCOUNTER — Ambulatory Visit
Admission: RE | Admit: 2021-03-23 | Discharge: 2021-03-23 | Disposition: A | Discharge status: Home / Self Care | Payer: Medicare HMO | Source: Ambulatory Visit | Attending: Family Medicine | Admitting: Family Medicine

## 2021-03-23 ENCOUNTER — Other Ambulatory Visit: Payer: Self-pay

## 2021-03-23 DIAGNOSIS — Z23 Encounter for immunization: Secondary | ICD-10-CM

## 2021-03-23 DIAGNOSIS — M542 Cervicalgia: Secondary | ICD-10-CM | POA: Diagnosis not present

## 2021-05-02 ENCOUNTER — Other Ambulatory Visit: Payer: Self-pay | Admitting: Family Medicine

## 2021-05-02 DIAGNOSIS — N529 Male erectile dysfunction, unspecified: Secondary | ICD-10-CM

## 2021-05-02 NOTE — Telephone Encounter (Signed)
Formatting of this note might be different from the original.  [facility] requesting to fill pt cialis . Please advise [facility]  Electronically signed by Renelda Loma, RMA at 05/02/2021 10:20 AM EST

## 2021-05-02 NOTE — Telephone Encounter (Signed)
[facility] requesting to fill pt cialis . Please advise [facility]

## 2021-05-13 ENCOUNTER — Other Ambulatory Visit: Payer: Self-pay | Admitting: Family Medicine

## 2021-05-13 MED ORDER — BENZONATATE 100 MG PO CAPS: 200.0000 mg | ORAL_CAPSULE | Freq: Three times a day (TID) | ORAL | 0 refills | Status: AC | PRN

## 2021-05-13 NOTE — Progress Notes (Signed)
Tessalon given for cough from Covid

## 2021-05-20 ENCOUNTER — Other Ambulatory Visit: Payer: Self-pay | Admitting: Family Medicine

## 2021-05-20 DIAGNOSIS — E785 Hyperlipidemia, unspecified: Secondary | ICD-10-CM

## 2021-06-01 DIAGNOSIS — H02823 Cysts of right eye, unspecified eyelid: Secondary | ICD-10-CM | POA: Diagnosis not present

## 2021-06-13 DIAGNOSIS — H02822 Cysts of right lower eyelid: Secondary | ICD-10-CM | POA: Diagnosis not present

## 2021-06-13 DIAGNOSIS — H02821 Cysts of right upper eyelid: Secondary | ICD-10-CM | POA: Diagnosis not present

## 2021-08-14 ENCOUNTER — Other Ambulatory Visit: Payer: Self-pay | Admitting: Family Medicine

## 2021-08-14 DIAGNOSIS — E785 Hyperlipidemia, unspecified: Secondary | ICD-10-CM

## 2021-08-31 ENCOUNTER — Other Ambulatory Visit: Payer: Self-pay | Admitting: Family Medicine

## 2021-08-31 DIAGNOSIS — E785 Hyperlipidemia, unspecified: Secondary | ICD-10-CM

## 2021-09-04 ENCOUNTER — Telehealth: Payer: Self-pay | Admitting: Family Medicine

## 2021-09-04 NOTE — Telephone Encounter (Signed)
Spoke to patient to schedule Medicare Annual Wellness Visit (AWV) either virtually or in office. ?On 09/08/21 prior to her cpx on 09/11/21 ? ?Pt could not due to being out of town and working  ? ?Last AWV ;08/31/20 ?please schedule at anytime with health coach ? ? ?

## 2021-09-10 ENCOUNTER — Other Ambulatory Visit: Payer: Self-pay | Admitting: Family Medicine

## 2021-09-10 DIAGNOSIS — E785 Hyperlipidemia, unspecified: Secondary | ICD-10-CM

## 2021-09-11 ENCOUNTER — Encounter: Payer: Self-pay | Admitting: Family Medicine

## 2021-09-11 ENCOUNTER — Ambulatory Visit (INDEPENDENT_AMBULATORY_CARE_PROVIDER_SITE_OTHER): Payer: Medicare HMO | Admitting: Family Medicine

## 2021-09-11 VITALS — BP 140/70 | HR 55 | Temp 96.8°F | Ht 66.0 in | Wt 171.4 lb

## 2021-09-11 DIAGNOSIS — I700 Atherosclerosis of aorta: Secondary | ICD-10-CM | POA: Diagnosis not present

## 2021-09-11 DIAGNOSIS — N400 Enlarged prostate without lower urinary tract symptoms: Secondary | ICD-10-CM | POA: Diagnosis not present

## 2021-09-11 DIAGNOSIS — Z0000 Encounter for general adult medical examination without abnormal findings: Secondary | ICD-10-CM | POA: Diagnosis not present

## 2021-09-11 DIAGNOSIS — H524 Presbyopia: Secondary | ICD-10-CM | POA: Diagnosis not present

## 2021-09-11 DIAGNOSIS — Z86010 Personal history of colonic polyps: Secondary | ICD-10-CM | POA: Diagnosis not present

## 2021-09-11 DIAGNOSIS — K449 Diaphragmatic hernia without obstruction or gangrene: Secondary | ICD-10-CM | POA: Diagnosis not present

## 2021-09-11 DIAGNOSIS — D692 Other nonthrombocytopenic purpura: Secondary | ICD-10-CM | POA: Diagnosis not present

## 2021-09-11 DIAGNOSIS — E785 Hyperlipidemia, unspecified: Secondary | ICD-10-CM | POA: Diagnosis not present

## 2021-09-11 DIAGNOSIS — K2100 Gastro-esophageal reflux disease with esophagitis, without bleeding: Secondary | ICD-10-CM

## 2021-09-11 DIAGNOSIS — H52203 Unspecified astigmatism, bilateral: Secondary | ICD-10-CM | POA: Diagnosis not present

## 2021-09-11 DIAGNOSIS — N529 Male erectile dysfunction, unspecified: Secondary | ICD-10-CM | POA: Diagnosis not present

## 2021-09-11 DIAGNOSIS — H5212 Myopia, left eye: Secondary | ICD-10-CM | POA: Diagnosis not present

## 2021-09-11 DIAGNOSIS — R058 Other specified cough: Secondary | ICD-10-CM

## 2021-09-11 DIAGNOSIS — Z8249 Family history of ischemic heart disease and other diseases of the circulatory system: Secondary | ICD-10-CM

## 2021-09-11 DIAGNOSIS — J301 Allergic rhinitis due to pollen: Secondary | ICD-10-CM

## 2021-09-11 DIAGNOSIS — Z961 Presence of intraocular lens: Secondary | ICD-10-CM | POA: Diagnosis not present

## 2021-09-11 LAB — COMPREHENSIVE METABOLIC PANEL
ALT: 25 IU/L (ref 0–44)
AST: 27 IU/L (ref 0–40)
Albumin/Globulin Ratio: 1.6 (ref 1.2–2.2)
Albumin: 4.6 g/dL (ref 3.7–4.7)
Alkaline Phosphatase: 64 IU/L (ref 44–121)
BUN/Creatinine Ratio: 19 (ref 10–24)
BUN: 17 mg/dL (ref 8–27)
Bilirubin Total: 0.6 mg/dL (ref 0.0–1.2)
CO2: 25 mmol/L (ref 20–29)
Calcium: 9.8 mg/dL (ref 8.6–10.2)
Chloride: 103 mmol/L (ref 96–106)
Creatinine, Ser: 0.89 mg/dL (ref 0.76–1.27)
Globulin, Total: 2.9 g/dL (ref 1.5–4.5)
Glucose: 91 mg/dL (ref 70–99)
Potassium: 4.9 mmol/L (ref 3.5–5.2)
Sodium: 141 mmol/L (ref 134–144)
Total Protein: 7.5 g/dL (ref 6.0–8.5)
eGFR: 87 mL/min/{1.73_m2} (ref >59)

## 2021-09-11 LAB — CBC WITH DIFFERENTIAL/PLATELET
Basophils Absolute: 0 10*3/uL (ref 0.0–0.2)
Basos: 0 %
EOS (ABSOLUTE): 0.1 10*3/uL (ref 0.0–0.4)
Eos: 2 %
Hematocrit: 45.6 % (ref 37.5–51.0)
Hemoglobin: 15.9 g/dL (ref 13.0–17.7)
Immature Grans (Abs): 0 10*3/uL (ref 0.0–0.1)
Immature Granulocytes: 0 %
Lymphocytes Absolute: 0.9 10*3/uL (ref 0.7–3.1)
Lymphs: 16 %
MCH: 31.6 pg (ref 26.6–33.0)
MCHC: 34.9 g/dL (ref 31.5–35.7)
MCV: 91 fL (ref 79–97)
Monocytes Absolute: 0.6 10*3/uL (ref 0.1–0.9)
Monocytes: 10 %
Neutrophils Absolute: 4.1 10*3/uL (ref 1.4–7.0)
Neutrophils: 72 %
Platelets: 208 10*3/uL (ref 150–450)
RBC: 5.03 x10E6/uL (ref 4.14–5.80)
RDW: 12.1 % (ref 11.6–15.4)
WBC: 5.7 10*3/uL (ref 3.4–10.8)

## 2021-09-11 LAB — LIPID PANEL
Chol/HDL Ratio: 3.4 ratio (ref 0.0–5.0)
Cholesterol, Total: 165 mg/dL (ref 100–199)
HDL: 48 mg/dL (ref >39)
LDL Chol Calc (NIH): 99 mg/dL (ref 0–99)
Triglycerides: 95 mg/dL (ref 0–149)
VLDL Cholesterol Cal: 18 mg/dL (ref 5–40)

## 2021-09-11 MED ORDER — ATORVASTATIN CALCIUM 10 MG PO TABS: 10.0000 mg | ORAL_TABLET | Freq: Every day | ORAL | 3 refills | Status: DC

## 2021-09-11 NOTE — Patient Instructions (Signed)
?  Logan Banks , ?Thank you for taking time to come for your Medicare Wellness Visit. I appreciate your ongoing commitment to your health goals. Please review the following plan we discussed and let me know if I can assist you in the future.  ? ?These are the goals we discussed: ? Goals   ?None ?  ?  ?This is a list of the screening recommended for you and due dates:  ?Health Maintenance  ?Topic Date Due  ? COVID-19 Vaccine (4 - Booster for Pfizer series) 06/30/2020  ? Flu Shot  12/19/2021  ? Colon Cancer Screening  06/21/2025  ? Tetanus Vaccine  09/01/2030  ? Pneumonia Vaccine  Completed  ? Hepatitis C Screening: USPSTF Recommendation to screen - Ages 61-79 yo.  Completed  ? Zoster (Shingles) Vaccine  Completed  ? HPV Vaccine  Aged Out  ?  ?

## 2021-09-11 NOTE — Progress Notes (Signed)
Logan Banks is a 81 y.o. male who presents for annual wellness visit and follow-up on chronic medical conditions.  He is now engaged to be married and plans to be living between here and [facility].  He continues to use Cialis on an as-needed basis.  There is a history of BPH but he is having no difficulty with urinary symptoms.  His allergies seem to be under good control.  He continues on his blood pressure medications and having no difficulty with them.  He has a history of aortic atherosclerosis and is on a statin drug.  He does have a family history of heart disease.  His allergies seem to be under good control.  He uses a Prilosec on an as-needed basis. ? ? ?Immunizations and Health Maintenance ?Immunization History  ?Administered Date(s) Administered  ? Fluad Quad(high Dose 65+) 01/01/2019, 01/27/2020, 03/23/2021  ? Influenza Split 02/13/2013  ? Influenza, High Dose Seasonal PF 02/15/2014, 01/26/2015, 01/12/2016, 02/21/2017, 02/26/2018  ? PFIZER(Purple Top)SARS-COV-2 Vaccination 06/12/2019, 07/03/2019, 05/05/2020  ? Pneumococcal Conjugate-13 12/03/2013  ? Pneumococcal Polysaccharide-23 12/15/2012  ? Tdap 08/30/2010, 08/31/2020  ? Zoster Recombinat (Shingrix) 07/30/2018, 08/31/2020  ? Zoster, Live 12/07/2013  ? ?Health Maintenance Due  ?Topic Date Due  ? COVID-19 Vaccine (4 - Booster for Pfizer series) 06/30/2020  ? ? ?Last colonoscopy: 07/10/16  Dr. Earlean Shawl ?Last PSA: 12/15/12 ( 0.81) ?Dentist: Q four months  ?Ophtho: Q year ?Exercise: 4 days a week for fifty min.  ? ?Other doctors caring for patient include: Dr. Earlean Shawl  and Dr. [facility] ?           Dr. Gershon Crane ophthalmology ?            ? ? ?Advanced Directives: ?  ? ?Depression screen:  See questionnaire below.   ?  ? ?  09/11/2021  ?  8:29 AM 08/31/2020  ?  8:17 AM 07/29/2019  ?  1:19 PM 07/25/2018  ?  2:31 PM 04/17/2017  ?  8:32 AM  ?Depression screen PHQ 2/9  ?Decreased Interest 0 0 0 0 0  ?Down, Depressed, Hopeless 0 0 0 0 0  ?PHQ - 2 Score 0 0  0 0 0  ? ? ?Fall Screen: See Questionaire below. ?  ? ?  09/11/2021  ?  8:29 AM 08/31/2020  ?  8:17 AM 07/29/2019  ?  1:19 PM 07/25/2018  ?  2:30 PM 04/17/2017  ?  8:32 AM  ?Fall Risk   ?Falls in the past year? 0 0 0 0 No  ?Number falls in past yr: 0 0     ?Injury with Fall? 0 0     ?Risk for fall due to : No Fall Risks No Fall Risks     ?Follow up Falls evaluation completed Falls evaluation completed     ? ? ?ADL screen:  See questionnaire below.  ?Functional Status Survey: ?Is the patient deaf or have difficulty hearing?: No ?Does the patient have difficulty seeing, even when wearing glasses/contacts?: No ?Does the patient have difficulty concentrating, remembering, or making decisions?: No ?Does the patient have difficulty walking or climbing stairs?: No ?Does the patient have difficulty dressing or bathing?: No ?Does the patient have difficulty doing errands alone such as visiting a doctor's office or shopping?: No ? ? ?Review of Systems ? ?Constitutional: -, -unexpected weight change, -anorexia, -fatigue ?Allergy: -sneezing, -itching, -congestion ?Dermatology: denies changing moles, rash, lumps ?ENT: -runny nose, -ear pain, -sore throat,  ?Cardiology:  -chest pain, -palpitations, -orthopnea, ?Respiratory: -  cough, -shortness of breath, -dyspnea on exertion, -wheezing,  ?Gastroenterology: -abdominal pain, -nausea, -vomiting, -diarrhea, -constipation, -dysphagia ?Hematology: -bleeding or bruising problems ?Musculoskeletal: -arthralgias, -myalgias, -joint swelling, -back pain, - ?Ophthalmology: -vision changes,  ?Urology: -dysuria, -difficulty urinating,  -urinary frequency, -urgency, incontinence ?Neurology: -, -numbness, , -memory loss, -falls, -dizziness ? ? ? ?PHYSICAL EXAM: ? ?BP 140/70   Pulse (!) 55   Temp (!) 96.8 ?F (36 ?C)   Ht 5\' 6"  (1.676 m)   Wt 171 lb 6.4 oz (77.7 kg)   SpO2 97%   BMI 27.66 kg/m?  ? ?General Appearance: Alert, cooperative, no distress, appears stated age ?Head: Normocephalic, without  obvious abnormality, atraumatic ?Eyes: PERRL, conjunctiva/corneas clear, EOM's intact, fundi benign ?Ears: Normal TM's and external ear canals ?Nose: Nares normal, mucosa normal, no drainage or sinus   tenderness ?Throat: Lips, mucosa, and tongue normal; teeth and gums normal ?Neck: Supple, no lymphadenopathy, thyroid:no enlargement/tenderness/nodules; no carotid bruit or JVD ?Lungs: Clear to auscultation bilaterally without wheezes, rales or ronchi; respirations unlabored ?Heart: Regular rate and rhythm, S1 and S2 normal, no murmur, rub or gallop ?Abdomen: Soft, non-tender, nondistended, normoactive bowel sounds, no masses, no hepatosplenomegaly ?Extremities: No clubbing, cyanosis or edema ?Pulses: 2+ and symmetric all extremities ?Skin: Purpuric lesions noted on both forearms.   ?Lymph nodes: Cervical, supraclavicular, and axillary nodes normal ?Neurologic: CNII-XII intact, normal strength, sensation and gait; reflexes 2+ and symmetric throughout   ?Psych: Normal mood, affect, hygiene and grooming ? ?ASSESSMENT/PLAN: ?Routine general medical examination at a health care facility ? ?Aortic atherosclerosis ([place]) - Plan: CBC with Differential/Platelet, Comprehensive metabolic panel, Lipid panel ? ?Senile purpura ([place]) - Plan: CBC with Differential/Platelet, Comprehensive metabolic panel ? ?Seasonal allergic rhinitis due to pollen ? ?Hernia, hiatal ? ?Gastroesophageal reflux disease with esophagitis without hemorrhage ? ?Benign prostatic hyperplasia, unspecified whether lower urinary tract symptoms present ? ?Family history of heart disease in male family member before age 19 - Plan: CBC with Differential/Platelet, Comprehensive metabolic panel, Lipid panel ? ?History of colon polyps ? ?Hyperlipidemia, unspecified hyperlipidemia type - Plan: atorvastatin (LIPITOR) 10 MG tablet ? ?Erectile dysfunction, unspecified erectile dysfunction type ?His meds were renewed.  He will also call when he needs a refill on his  Cialis.  I congratulated him on his engagement.  Discussed COVID-vaccine with him and he is not interested in getting that shot today.  Treat the allergies as needed. ? ? ?Immunization recommendations discussed.  Colonoscopy recommendations reviewed. ? ? ?Medicare Attestation ?I have personally reviewed: ?The patient's medical and social history ?Their use of alcohol, tobacco or illicit drugs ?Their current medications and supplements ?The patient's functional ability including ADLs,fall risks, home safety risks, cognitive, and hearing and visual impairment ?Diet and physical activities ?Evidence for depression or mood disorders ? ?The patient's weight, height, and BMI have been recorded in the chart.  I have made referrals, counseling, and provided education to the patient based on review of the above and I have provided the patient with a written personalized care plan for preventive services.   ? ? ?Jill Alexanders, MD   09/11/2021  ? ? ?  ?

## 2021-09-12 MED ORDER — ATORVASTATIN CALCIUM 20 MG PO TABS: 20.0000 mg | ORAL_TABLET | Freq: Every day | ORAL | 3 refills | Status: DC

## 2021-09-12 NOTE — Telephone Encounter (Signed)
Patient said, Dr. Denita Lung with the [facility], Phone#[phone] will send his medical records through [facility].     Patient said, he will be a New Patient with Dr. Evalyn Casco.

## 2021-09-12 NOTE — Addendum Note (Signed)
Addended by: Denita Lung on: 09/12/2021 08:35 AM ? ? Modules accepted: Orders ? ?

## 2021-09-19 ENCOUNTER — Ambulatory Visit
Admit: 2021-09-19 | Discharge: 2021-09-19 | Payer: MEDICARE | Attending: Emergency Medicine | Primary: Emergency Medicine

## 2021-09-19 DIAGNOSIS — K2100 Gastro-esophageal reflux disease with esophagitis, without bleeding: Secondary | ICD-10-CM | POA: Diagnosis not present

## 2021-09-19 DIAGNOSIS — J301 Allergic rhinitis due to pollen: Secondary | ICD-10-CM | POA: Diagnosis not present

## 2021-09-19 DIAGNOSIS — N5201 Erectile dysfunction due to arterial insufficiency: Secondary | ICD-10-CM | POA: Diagnosis not present

## 2021-09-19 DIAGNOSIS — R69 Illness, unspecified: Secondary | ICD-10-CM | POA: Diagnosis not present

## 2021-09-19 DIAGNOSIS — M5030 Other cervical disc degeneration, unspecified cervical region: Secondary | ICD-10-CM | POA: Diagnosis not present

## 2021-09-19 DIAGNOSIS — E7800 Pure hypercholesterolemia, unspecified: Secondary | ICD-10-CM | POA: Diagnosis not present

## 2021-09-19 DIAGNOSIS — I10 Essential (primary) hypertension: Secondary | ICD-10-CM | POA: Diagnosis not present

## 2021-09-19 DIAGNOSIS — I700 Atherosclerosis of aorta: Secondary | ICD-10-CM | POA: Diagnosis not present

## 2021-09-19 DIAGNOSIS — Z125 Encounter for screening for malignant neoplasm of prostate: Secondary | ICD-10-CM | POA: Diagnosis not present

## 2021-09-19 DIAGNOSIS — Z86010 Personal history of colonic polyps: Secondary | ICD-10-CM | POA: Diagnosis not present

## 2021-09-19 NOTE — Progress Notes (Signed)
Chief Complaint   Patient presents with    New Patient     Also patient of [facility])       HPI:     Dalton Bailey is a pleasant  81 y.o. year-old patient who presents come established as a new patient to the practice.  He did have a SAWV through previous PCP in [place] earlier this month.  I was able to review records and labs and everything was well controlled.  Cardiovascular and other risk factors assesed (incidental finding of calcified aorta on kidney CT takes aspirin and reports negative cardiac testing for atypical chest pain 7 years ago (NST and echo).  BP and hypercholesterolemia managed with medications.  He denies any chest pain, shortness of breath or syncope/TIA or claudication.  He does go to the gym several days a week.  He is engaged to another one of my patients.  He plans to still see his doctor in [place] as he works 1 week/month up there.  We reviewed and updated problem list, reviewed and addressed and any abnormal labs, and reviewed any applicable specialists' reports.    Additionally, patient has had previous colon polyps and reports normal colonoscopy about 7 years ago and declines repeat (turns 82 next year).  He had GERD with esophagitis 2 years ago treated with PPI with resolution of symptoms.  Additional medical conditions include: HTN, BPH mild/no meds, ED, DDD cervical spine, seasonal rhinitis with some wheezing.   We follow the patient every 6 months with labs.    Current Outpatient Medications   Medication Sig Note Dispense Refill    Multiple Vitamins-Minerals (MULTIVITAMIN ADULTS PO) Take 1 tablet by mouth       Zinc 25 MG TABS Take 35 mg by mouth       omeprazole (PRILOSEC) 40 MG delayed release capsule        atorvastatin (LIPITOR) 20 MG tablet        albuterol sulfate (PROAIR RESPICLICK) 108 (90 Base) MCG/ACT aerosol powder inhalation Inhale into the lungs every 6 hours as needed       ALPRAZolam (XANAX) 0.25 MG tablet Take 1 tablet by mouth  daily as needed. 1 tablet       ASPIRIN 81 PO Take 81 mg by mouth       chlorhexidine (PERIDEX) 0.12 % solution Swish and spit 15 mLs 2 times daily       Co-Enzyme Q10 100 MG CAPS Take by mouth       guaiFENesin (MUCINEX) 600 MG extended release tablet Take 2 tablets by mouth 2 times daily       guaiFENesin (ROBITUSSIN) 100 MG/5ML liquid Take 10 mLs by mouth 3 times daily as needed       OMEGA-3 FATTY ACIDS PO Take by mouth       pseudoephedrine (SUDAFED 12 HR) 120 MG extended release tablet Take 1 tablet by mouth in the morning and 1 tablet in the evening.       tadalafil (CIALIS) 20 MG tablet Take 1 tablet by mouth as needed 09/19/2021: Takes 1/2 tablet       No current facility-administered medications for this visit.     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 normal CBC, CMP, LDL 99/HDL 48, no PSA, TSH or UA    Primary hypertension 2021    Seasonal allergic rhinitis     Pollen related    Senile purpura (HCC)      Past Surgical History:   Procedure Laterality Date    CATARACT EXTRACTION W/ INTRAOCULAR LENS IMPLANT  2015     COLONOSCOPY  06/2015    Dr. Kinnie ScalesMedoff, Next due 06/2025 per previous records    ESOPHAGOGASTRODUODENOSCOPY  2022    Esophagitis NC with normal FU    FINGER TRIGGER RELEASE Bilateral 1997    5 different fingers    INGUINAL HERNIA REPAIR Right 2019    NC         ROS:     General/Constitutional:   Patient denies fever, shakes or chills, excessive weight gain or weight loss.  Comments insomnia/awakens after 5 hours has oatmeal and goes back to sleep for 2 hours  ENT:   Patient denies ear pain, change in hearing, nosebleed or difficulty swallowing.   Ophthalmologic:   Patient denies diminished visual acuity, eye pain or discharge.   Endocrine:   Patient denies excessive thirst, hunger or frequent urination.   Respiratory:   Patient denies cough, shortness of breath, wheezing or hemoptysis.   Cardiovascular:   Patient denies chest pain, palpatations or syncope.   Gastrointestinal:   Patient denies abdominal pain, nausea, vomiting or diarrhea, blood in stool.  Comments history of esophagitis 2021 and colon polyps-declines further screening colonoscopy  Men Only:   Patient BPH symptoms other than nocturia X1 and decreased stream, takes Cialis 20 mg, 1/2 tablet as needed  Musculoskeletal:   Patient denies leg swelling, painful or swollen joints.   Skin:   Patient denies rash.   Neurologic:   Patient denies dizziness, loss of use of extremity or focal neurologic deficit.   Psychiatric:   Patient denies anxiety, depression or suicidal thoughts.     Vital signs: BP 113/69 (Site: Left Upper Arm, Position: Sitting, Cuff Size: Large Adult)   Pulse 79   Temp 98.2 F (36.8 C) (Temporal)   Resp 16   Ht 5' 6.5" (1.689 m)   Wt 172 lb (78 kg)   SpO2 97%   BMI 27.35 kg/m       Physical Examination:   General Examination:  GENERAL APPEARANCE: well developed and well nourished in NAD.   EYES: PERRLA, EOMI conjunctiva clear and sclera nonicteric.   EARS: canals clear and TM's are normal.   NOSE: Bilateral nares are patent with normal  inferior turbinates without drainage, erythema or masses.   THROAT: MMM, pharynx normal  without erythema or exudate.   NECK: supple with FROM and no thyromegaly or mass, -carotid bruit or thrill .   HEART: RRR without murmurs, clicks or rubs.   LUNGS: CTA in all fields without rales, whezes or rubs.   ABDOMEN: soft with positive BS, nontender, no hepatosplenomegaly, no guarding or rebound and no masses or hernias.   RECTAL EXAM: not examined.   SKIN: warm and dry without rash or suspicious lesions.   BACK: FROM, no point tenderness, no bony stepoff or crepitus.   EXTREMITIES: FROM without clubbing, cyanosis or edema.   NEUROLOGIC: CN II - XII normal, motor strength 5/5 throughout and sensory exam intact.   MALE GENITOURINARY: not examined.   VASCULAR: 2+ bilateral radial pulses, 2+ dorsalis pedis and posterior tibial pulses bilaterally .   PSYCH: cognitive function intact, judgement and insight good, mood/affect full range.       Assessment/Plan:  1. Hypercholesteremia  Overview:  Rx Atorvastatin 10 mg  Orders:  -     Lipid Panel; Future  -     Comprehensive Metabolic Panel; Future  -     CBC with Auto Differential; Future  -     Lipid Panel; Future  -     Comprehensive Metabolic Panel; Future  -     CBC with Auto Differential; Future  2. Primary hypertension  -     TSH with Reflex; Future  -     Urinalysis W/ Rflx Microscopic; Future  3. Seasonal allergic rhinitis due to pollen  Overview:  Pollen related  4. Erectile dysfunction due to arterial insufficiency  Overview:  Rx Cialis 10mg   5. Chronic insomnia  Overview:  early awakening  6. History of colon polyps  Overview:  "normal"  7. Gastroesophageal reflux disease with esophagitis without hemorrhage  Overview:  without hemorrhage  8. DDD (degenerative disc disease), cervical  Overview:  XR multilevel DDD/facet hypertrophy C4-7, anterolisthesis C3-4 and C4-5  9. 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 Screening for  prostate cancer  -     PSA Screening; Future    Time documentation as follows: 20 minutes reviewing old records, labs and previous visit note, 20 minutes discussing history of present illness/complaint as well as discussing treatment and plan, 5 minutes performing pertinent exam, 15 minutes documenting chart for total time of 60 minutes

## 2021-10-29 ENCOUNTER — Other Ambulatory Visit: Payer: Self-pay | Admitting: Family Medicine

## 2021-10-30 NOTE — Telephone Encounter (Signed)
Formatting of this note might be different from the original.  [facility] is requesting to fill pt xanax. Please advise [facility]  Electronically signed by Elyse Jarvis, [place] at 10/30/2021 11:21 AM EDT

## 2021-10-30 NOTE — Telephone Encounter (Signed)
[facility] is requesting to fill pt xanax. Please advise [facility] 

## 2021-12-06 ENCOUNTER — Telehealth: Payer: Self-pay | Admitting: Family Medicine

## 2021-12-06 NOTE — Telephone Encounter (Signed)
Formatting of this note might be different from the original.  Pt advised and now he is asking when will he need one again?  Electronically signed by Olegario Messier at 12/06/2021  3:01 PM EDT

## 2021-12-06 NOTE — Telephone Encounter (Signed)
Formatting of this note might be different from the original.  Pt wanting to know if he is due for a pneumonia shot and if so is it better for him to come here and get it or can he go to a [facility]?  Electronically signed by Olegario Messier at 12/06/2021 10:31 AM EDT

## 2021-12-06 NOTE — Telephone Encounter (Signed)
Pt advised and now he is asking when will he need one again?

## 2021-12-06 NOTE — Telephone Encounter (Signed)
Pt wanting to know if he is due for a pneumonia shot and if so is it better for him to come here and get it or can he go to a [facility]?

## 2021-12-25 ENCOUNTER — Telehealth: Payer: Self-pay

## 2021-12-25 MED ORDER — SILDENAFIL CITRATE 100 MG PO TABS: 50.0000 mg | ORAL_TABLET | Freq: Every day | ORAL | 5 refills | Status: AC | PRN

## 2021-12-25 NOTE — Telephone Encounter (Signed)
Formatting of this note might be different from the original.  Received fax from [facility] for a med switch from Cialis to sildenafil since cialis is on back order. Last apt was 09/11/21 next apt 09/13/22.  Electronically signed by Fredirick Maudlin, RMA at 12/25/2021  2:07 PM EDT

## 2021-12-25 NOTE — Telephone Encounter (Signed)
Received fax from [facility] for a med switch from Cialis to sildenafil since cialis is on back order. Last apt was 09/11/21 next apt 09/13/22.

## 2021-12-26 ENCOUNTER — Ambulatory Visit: Payer: Medicare HMO | Admitting: Family Medicine

## 2022-02-27 ENCOUNTER — Encounter: Payer: Self-pay | Admitting: Internal Medicine

## 2022-03-05 ENCOUNTER — Encounter: Admit: 2022-03-05 | Discharge: 2022-03-05 | Payer: MEDICARE | Primary: Emergency Medicine

## 2022-03-05 DIAGNOSIS — E7800 Pure hypercholesterolemia, unspecified: Secondary | ICD-10-CM | POA: Diagnosis not present

## 2022-03-06 LAB — COMPREHENSIVE METABOLIC PANEL
ALT: 23 U/L (ref 0–50)
AST: 29 U/L (ref 0–50)
Albumin/Globulin Ratio: 1.8 (ref 1.00–2.70)
Albumin: 4.4 g/dL (ref 3.5–5.2)
Alk Phosphatase: 56 U/L (ref 40–130)
Anion Gap: 11 mmol/L (ref 2–17)
BUN: 22 mg/dL (ref 8–23)
CO2: 25 mmol/L (ref 22–29)
Calcium: 9.5 mg/dL (ref 8.8–10.2)
Chloride: 104 mmol/L (ref 98–107)
Creatinine: 1 mg/dL (ref 0.7–1.3)
Est, Glom Filt Rate: 77 mL/min/1.73m (ref >=60)
Globulin: 2.5 g/dL (ref 1.9–4.4)
Glucose: 90 mg/dL (ref 70–99)
OSMOLALITY CALCULATED: 282 mOsm/kg (ref 270–287)
Potassium: 4.9 mmol/L (ref 3.5–5.3)
Sodium: 140 mmol/L (ref 135–145)
Total Bilirubin: 0.56 mg/dL (ref 0.00–1.20)
Total Protein: 6.9 g/dL (ref 6.4–8.3)

## 2022-03-06 LAB — CBC WITH AUTO DIFFERENTIAL
Absolute Baso #: 0 10*3/uL (ref 0.0–0.2)
Absolute Eos #: 0.1 10*3/uL (ref 0.0–0.5)
Absolute Lymph #: 0.9 10*3/uL — ABNORMAL LOW (ref 1.0–3.2)
Absolute Mono #: 0.6 10*3/uL (ref 0.3–1.0)
Basophils %: 0.4 % (ref 0.0–2.0)
Eosinophils %: 1.8 % (ref 0.0–7.0)
Hematocrit: 43.3 % (ref 38.0–52.0)
Hemoglobin: 14.9 g/dL (ref 13.0–17.3)
Immature Grans (Abs): 0.02 10*3/uL (ref 0.00–0.06)
Immature Granulocytes: 0.4 % (ref 0.0–0.6)
Lymphocytes: 15.3 % (ref 15.0–45.0)
MCH: 31.4 pg (ref 27.0–34.5)
MCHC: 34.4 g/dL (ref 32.0–36.0)
MCV: 91.2 fL (ref 84.0–100.0)
MPV: 10.9 fL (ref 7.2–13.2)
Monocytes: 10.2 % (ref 4.0–12.0)
NRBC Absolute: 0 10*3/uL (ref 0.000–0.012)
NRBC Automated: 0 % (ref 0.0–0.2)
Neutrophils %: 71.9 % (ref 42.0–74.0)
Neutrophils Absolute: 4 10*3/uL (ref 1.6–7.3)
Platelets: 220 10*3/uL (ref 140–440)
RBC: 4.75 x10e6/mcL (ref 4.00–5.60)
RDW: 12.6 % (ref 11.0–16.0)
WBC: 5.6 10*3/uL (ref 3.8–10.6)

## 2022-03-06 LAB — LIPID PANEL
Chol/HDL Ratio: 3.5 (ref 0.0–4.4)
Cholesterol: 154 mg/dL (ref 100–200)
HDL: 44 mg/dL (ref >=40)
LDL Cholesterol: 94.6 mg/dL (ref 0.0–100.0)
LDL/HDL Ratio: 2.2
Triglycerides: 77 mg/dL (ref 0–149)
VLDL: 15.4 mg/dL (ref 5.0–40.0)

## 2022-03-12 ENCOUNTER — Encounter: Payer: Self-pay | Admitting: Internal Medicine

## 2022-03-15 ENCOUNTER — Encounter: Payer: MEDICARE | Primary: Emergency Medicine

## 2022-03-22 ENCOUNTER — Encounter
Admit: 2022-03-22 | Discharge: 2022-03-22 | Payer: MEDICARE | Attending: Emergency Medicine | Primary: Emergency Medicine

## 2022-03-22 DIAGNOSIS — R69 Illness, unspecified: Secondary | ICD-10-CM | POA: Diagnosis not present

## 2022-03-22 DIAGNOSIS — J301 Allergic rhinitis due to pollen: Secondary | ICD-10-CM | POA: Diagnosis not present

## 2022-03-22 DIAGNOSIS — E7800 Pure hypercholesterolemia, unspecified: Secondary | ICD-10-CM | POA: Diagnosis not present

## 2022-03-22 DIAGNOSIS — I10 Essential (primary) hypertension: Secondary | ICD-10-CM | POA: Diagnosis not present

## 2022-03-22 DIAGNOSIS — Z86010 Personal history of colonic polyps: Secondary | ICD-10-CM | POA: Diagnosis not present

## 2022-03-22 NOTE — Progress Notes (Signed)
CHIEF COMPLAINT:  Chief Complaint   Patient presents with    Follow-up     6 month f/u       HPI:   Symptom(s):   Dalton Bailey is a pleasant  81 y.o. year-old who is a new patient in May, 2023 with  history of: Family history of heart disease, hypercholesterolemia and HTN who presents for a scheduled 6 months follow-up appointment with labs.   Labs revealed everything stable/well-controlled.  Patient denies any hospitalizations or ER visits since last office visit. Specialists since the last visit include: None.  Patient denies any complaints, he moved here with his new girlfriend who is also a patient.  He exercises at the gym for 45 minutes daily including cardio and weights.  Overdue tests/specialists: Colonoscopy with history of polyps.  Previously declined this test and we rediscussed today.  He will be turning 81 years old next year.  His seasonal allergies are flaring up again and we discussed Sudafed probably not the best medicine.  Recommended topical nasal treatment.  Additional chronic medical conditions managed include: BPH mild/no meds/ED, DDD cervical spine, seasonal rhinitis history of GERD 2021 resolved, and insomnia/early awakening.  Normal follow-up every 6 months with labs.  Specialist(s):  Dr. Nelva Bush GI pending    Current Outpatient Medications   Medication Sig Note Dispense Refill    Multiple Vitamins-Minerals (MULTIVITAMIN ADULTS PO) Take 1 tablet by mouth       Zinc 25 MG TABS Take 35 mg by mouth       omeprazole (PRILOSEC) 40 MG delayed release capsule        atorvastatin (LIPITOR) 20 MG tablet        albuterol sulfate (PROAIR RESPICLICK) 108 (90 Base) MCG/ACT aerosol powder inhalation Inhale into the lungs every 6 hours as needed       ALPRAZolam (XANAX) 0.25 MG tablet Take 1 tablet by mouth daily as needed. 1 tablet       ASPIRIN 81 PO Take 81 mg by mouth       chlorhexidine (PERIDEX) 0.12 % solution Swish and spit 15 mLs 2 times daily       Co-Enzyme Q10 100 MG CAPS Take by mouth        guaiFENesin (MUCINEX) 600 MG extended release tablet Take 2 tablets by mouth 2 times daily       guaiFENesin (ROBITUSSIN) 100 MG/5ML liquid Take 10 mLs by mouth 3 times daily as needed       OMEGA-3 FATTY ACIDS PO Take by mouth       pseudoephedrine (SUDAFED 12 HR) 120 MG extended release tablet Take 1 tablet by mouth in the morning and 1 tablet in the evening.       tadalafil (CIALIS) 20 MG tablet Take 1 tablet by mouth as needed 09/19/2021: Takes 1/2 tablet      sildenafil (VIAGRA) 100 MG tablet Take 0.5-1 tablets by mouth daily as needed (Patient not taking: Reported on 03/22/2022)        No current facility-administered medications for this visit.     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    BPH (benign prostatic hyperplasia)  no Rxs    Chronic insomnia     early awakening    DDD (degenerative disc disease), cervical 05/23/2020    XR multilevel DDD/facet hypertrophy C4-7, anterolisthesis C3-4 and C4-5    ED (erectile dysfunction)     Rx Cialis 10mg     Family history of heart disease     M dec @ 89 (MI before 81 yo)    GERD with esophagitis 2021    without hemorrhage    History of colon polyps 06/2015    "normal"    History of nuclear stress test     normal ~2000    Hypercholesteremia     Rx Atorvastatin 10 mg    Medicare annual wellness visit, subsequent 09/11/2021    02/2022 stable LDL 95    Primary hypertension 2021    Seasonal allergic rhinitis     Pollen related    Senile purpura ([place])      Past Surgical History:   Procedure Laterality Date    CATARACT EXTRACTION W/ INTRAOCULAR LENS IMPLANT  2015    COLONOSCOPY  06/2015    Dr. Earlean Shawl, Next due 06/2025 per previous records     ESOPHAGOGASTRODUODENOSCOPY  2022    Esophagitis NC with normal FU    FINGER TRIGGER RELEASE Bilateral 1997    5 different fingers    INGUINAL HERNIA REPAIR Right 2019    NC           ROS:   General/Constitutional:   Patient denies fever, shakes or chills, excessive weight gain or weight loss. Comments insomnia/awakens after 5 hours has oatmeal and goes back to sleep for 2 hours  Allergy/immunology:  Patient has seasonal allergies twice a year currently present  ENT:   Patient denies ear pain, change in hearing, nosebleed or difficulty swallowing.   Respiratory:   Patient denies cough, shortness of breath, wheezing or hemoptysis.   Cardiovascular:   Patient denies chest pain, palpatations or syncope.   Gastrointestinal:   Patient denies abdominal pain, nausea, vomiting or diarrhea, blood in stool.  Comments history of colon polyps due for follow-up rediscussed previously declined  Men Only:   Patient BPH symptoms other than nocturia X1 and decreased stream, takes Cialis 20 mg, 1/2 tablet as needed  Musculoskeletal:   Patient denies leg swelling, painful or swollen joints.   Skin:   Patient denies rash.   Neurologic:   Patient denies dizziness, loss of use of extremity or focal neurologic deficit.   Psychiatric:   Patient denies anxiety, depression or suicidal thoughts.     Vital signs: BP 124/71 (Site: Left Upper Arm, Position: Sitting, Cuff Size: Small Adult)   Pulse 68   Temp 98.6 F (37 C) (Temporal)   Resp 16   Wt 78.8 kg (173 lb 11.6 oz)   SpO2 98%   BMI 27.62 kg/m       Physical Examination:   GENERAL APPEARANCE: well developed and well nourished in NAD.   HEAD: Atraumatic and normocephalic  EYES: PERRLA, EOMI conjunctiva clear and sclera nonicteric.   EARS: canals clear and TM's are normal.   NOSE: Bilateral nares are patent with normal inferior turbinates without drainage, but mild erythema/edema without masses.   THROAT: MMM, pharynx normal without erythema or exudate.   NECK: supple with FROM and no  thyromegaly or mass, -carotid bruit or thrill .   HEART: RRR without murmurs, clicks or rubs.   LUNGS: CTA in all fields without rales, whezes or rubs.   ABDOMEN: Rectus  diathesis, soft with positive BS, nontender, no hepatosplenomegaly, no guarding or rebound and no masses or hernias.   EXTREMITIES: FROM without clubbing, cyanosis or edema.  BACK: FROM, no point tenderness, no bony stepoff or crepitus.   SKIN: warm and dry without rash or suspicious lesions.   NEUROLOGIC: CN II - XII normal, motor strength 5/5 throughout and sensory exam intact.   PSYCH: cognitive function intact, judgement and insight good, mood/affect full range.    Assessment/Plan:  1. Hypercholesteremia  Comments:  Controlled  Overview:  Rx Atorvastatin 10 mg  2. Primary hypertension  Comments:  At goal  3. Seasonal allergic rhinitis due to pollen  Comments:  Discussed proper OTC meds/side effect profiles  Overview:  Pollen related  4. Chronic insomnia  Comments:  No prescription meds required  Overview:  early awakening  5. History of colon polyps  Comments:  We discussed R&B of colonoscopy and patient agreeable to referral to GI.  Referral Dr. Nelva Bush  Overview:  "normal"  Orders:  -     Hillary Bow, MD - Gastroenterology      Follow-up and Dispositions    Return in about 6 months (around 09/20/2022) for Physical with labs.       Medical decision making included assessment and treatment of problems: 2 or more stable chronic conditions (HTN, hypercholesterolemia, insomnia, seasonal allergies, history of polyps), analysis of information including: Recent labs reviewed, future labs ordered, records from other providers reviewed, and the overall risk was moderate

## 2022-05-01 DIAGNOSIS — Z1211 Encounter for screening for malignant neoplasm of colon: Secondary | ICD-10-CM | POA: Diagnosis not present

## 2022-05-01 DIAGNOSIS — Z86010 Personal history of colonic polyps: Secondary | ICD-10-CM | POA: Diagnosis not present

## 2022-06-12 DIAGNOSIS — L821 Other seborrheic keratosis: Secondary | ICD-10-CM | POA: Diagnosis not present

## 2022-06-12 DIAGNOSIS — D235 Other benign neoplasm of skin of trunk: Secondary | ICD-10-CM | POA: Diagnosis not present

## 2022-06-12 DIAGNOSIS — D225 Melanocytic nevi of trunk: Secondary | ICD-10-CM | POA: Diagnosis not present

## 2022-06-12 DIAGNOSIS — L570 Actinic keratosis: Secondary | ICD-10-CM | POA: Diagnosis not present

## 2022-06-12 DIAGNOSIS — L578 Other skin changes due to chronic exposure to nonionizing radiation: Secondary | ICD-10-CM | POA: Diagnosis not present

## 2022-06-12 DIAGNOSIS — D485 Neoplasm of uncertain behavior of skin: Secondary | ICD-10-CM | POA: Diagnosis not present

## 2022-06-12 DIAGNOSIS — L603 Nail dystrophy: Secondary | ICD-10-CM | POA: Diagnosis not present

## 2022-06-18 DIAGNOSIS — D122 Benign neoplasm of ascending colon: Secondary | ICD-10-CM | POA: Diagnosis not present

## 2022-06-18 DIAGNOSIS — D123 Benign neoplasm of transverse colon: Secondary | ICD-10-CM | POA: Diagnosis not present

## 2022-06-18 DIAGNOSIS — K648 Other hemorrhoids: Secondary | ICD-10-CM | POA: Diagnosis not present

## 2022-06-18 DIAGNOSIS — Z1211 Encounter for screening for malignant neoplasm of colon: Secondary | ICD-10-CM | POA: Diagnosis not present

## 2022-06-18 DIAGNOSIS — K635 Polyp of colon: Secondary | ICD-10-CM | POA: Diagnosis not present

## 2022-07-10 DIAGNOSIS — L538 Other specified erythematous conditions: Secondary | ICD-10-CM | POA: Diagnosis not present

## 2022-07-10 DIAGNOSIS — D485 Neoplasm of uncertain behavior of skin: Secondary | ICD-10-CM | POA: Diagnosis not present

## 2022-07-10 DIAGNOSIS — D225 Melanocytic nevi of trunk: Secondary | ICD-10-CM | POA: Diagnosis not present

## 2022-08-28 ENCOUNTER — Telehealth

## 2022-08-28 DIAGNOSIS — S46811A Strain of other muscles, fascia and tendons at shoulder and upper arm level, right arm, initial encounter: Secondary | ICD-10-CM | POA: Diagnosis not present

## 2022-08-28 MED ORDER — TADALAFIL 20 MG PO TABS
20 | ORAL_TABLET | ORAL | 1 refills | Status: DC | PRN
Start: 2022-08-28 — End: 2022-09-24

## 2022-08-28 NOTE — Telephone Encounter (Signed)
Bernette Mayers requests a refill Rx Tadalafil sent to [facility]

## 2022-08-28 NOTE — Telephone Encounter (Signed)
Requested Prescriptions     Pending Prescriptions Disp Refills    tadalafil (CIALIS) 20 MG tablet 30 tablet 1     Sig: Take 1 tablet by mouth as needed for Erectile Dysfunction

## 2022-09-04 ENCOUNTER — Encounter: Admit: 2022-09-04 | Discharge: 2022-09-04 | Payer: MEDICARE | Primary: Emergency Medicine

## 2022-09-04 DIAGNOSIS — E7800 Pure hypercholesterolemia, unspecified: Secondary | ICD-10-CM | POA: Diagnosis not present

## 2022-09-04 DIAGNOSIS — I10 Essential (primary) hypertension: Secondary | ICD-10-CM | POA: Diagnosis not present

## 2022-09-04 DIAGNOSIS — Z125 Encounter for screening for malignant neoplasm of prostate: Secondary | ICD-10-CM | POA: Diagnosis not present

## 2022-09-04 LAB — URINALYSIS W/ RFLX MICROSCOPIC
Bilirubin Urine: NEGATIVE
Blood, Urine: NEGATIVE
Glucose, UA: NEGATIVE
Ketones, Urine: NEGATIVE
Leukocyte Esterase, Urine: NEGATIVE
Nitrite, Urine: NEGATIVE
Protein, UA: NEGATIVE
Specific Gravity, UA: 1.02 (ref 1.003–1.035)
Urobilinogen, Urine: 0.2 EU/dL (ref >1.0)
pH, UA: 6.5 (ref 4.5–8.0)

## 2022-09-05 LAB — COMPREHENSIVE METABOLIC PANEL
ALT: 31 U/L (ref 0–50)
AST: 27 U/L (ref 0–50)
Albumin/Globulin Ratio: 1.8 (ref 1.00–2.70)
Albumin: 4.4 g/dL (ref 3.5–5.2)
Alk Phosphatase: 58 U/L (ref 40–130)
Anion Gap: 11 mmol/L (ref 2–17)
BUN: 22 mg/dL (ref 8–23)
CO2: 24 mmol/L (ref 22–29)
Calcium: 9.3 mg/dL (ref 8.5–10.7)
Chloride: 105 mmol/L (ref 98–107)
Creatinine: 1 mg/dL (ref 0.7–1.3)
Est, Glom Filt Rate: 76 mL/min/1.73m (ref >=60)
Globulin: 2.5 g/dL (ref 1.9–4.4)
Glucose: 111 mg/dL — ABNORMAL HIGH (ref 70–99)
OSMOLALITY CALCULATED: 283 mOsm/kg (ref 270–287)
Potassium: 5.3 mmol/L (ref 3.5–5.3)
Sodium: 140 mmol/L (ref 135–145)
Total Bilirubin: 0.53 mg/dL (ref 0.00–1.20)
Total Protein: 6.9 g/dL (ref 5.7–8.3)

## 2022-09-05 LAB — LIPID PANEL
Chol/HDL Ratio: 3.4 (ref 0.0–4.4)
Cholesterol: 160 mg/dL (ref 100–200)
HDL: 47 mg/dL (ref >=40)
LDL Cholesterol: 97 mg/dL (ref 0.0–100.0)
LDL/HDL Ratio: 2.1
Triglycerides: 80 mg/dL (ref 0–149)
VLDL: 16 mg/dL (ref 5.0–40.0)

## 2022-09-05 LAB — CBC WITH AUTO DIFFERENTIAL
Absolute Baso #: 0 10*3/uL (ref 0.0–0.2)
Absolute Eos #: 0.1 10*3/uL (ref 0.0–0.5)
Absolute Lymph #: 1 10*3/uL (ref 1.0–3.2)
Absolute Mono #: 0.6 10*3/uL (ref 0.3–1.0)
Basophils %: 0.4 % (ref 0.0–2.0)
Eosinophils %: 2.3 % (ref 0.0–7.0)
Hematocrit: 43.7 % (ref 38.0–52.0)
Hemoglobin: 14.8 g/dL (ref 13.0–17.3)
Immature Grans (Abs): 0.02 10*3/uL (ref 0.00–0.06)
Immature Granulocytes %: 0.4 % (ref 0.0–0.6)
Lymphocytes: 17 % (ref 15.0–45.0)
MCH: 31.2 pg (ref 27.0–34.5)
MCHC: 33.9 g/dL (ref 32.0–36.0)
MCV: 92.2 fL (ref 84.0–100.0)
MPV: 10.6 fL (ref 7.2–13.2)
Monocytes: 10.2 % (ref 4.0–12.0)
NRBC Absolute: 0 10*3/uL (ref 0.000–0.012)
NRBC Automated: 0 % (ref 0.0–0.2)
Neutrophils %: 69.7 % (ref 42.0–74.0)
Neutrophils Absolute: 3.9 10*3/uL (ref 1.6–7.3)
Platelets: 196 10*3/uL (ref 140–440)
RBC: 4.74 x10e6/mcL (ref 4.00–5.60)
RDW: 12.7 % (ref 11.0–16.0)
WBC: 5.6 10*3/uL (ref 3.8–10.6)

## 2022-09-05 LAB — PSA SCREENING: Screening PSA: 1.29 ng/mL (ref 0.000–4.000)

## 2022-09-05 LAB — TSH WITH REFLEX: TSH: 1.85 mcIU/mL (ref 0.358–3.740)

## 2022-09-06 ENCOUNTER — Encounter: Payer: MEDICARE | Primary: Emergency Medicine

## 2022-09-13 ENCOUNTER — Ambulatory Visit: Payer: Medicare HMO | Admitting: Family Medicine

## 2022-09-13 ENCOUNTER — Encounter: Payer: MEDICARE | Primary: Emergency Medicine

## 2022-09-13 DIAGNOSIS — Z86010 Personal history of colonic polyps: Secondary | ICD-10-CM

## 2022-09-13 DIAGNOSIS — J301 Allergic rhinitis due to pollen: Secondary | ICD-10-CM

## 2022-09-13 DIAGNOSIS — D692 Other nonthrombocytopenic purpura: Secondary | ICD-10-CM

## 2022-09-13 DIAGNOSIS — N400 Enlarged prostate without lower urinary tract symptoms: Secondary | ICD-10-CM

## 2022-09-13 DIAGNOSIS — I700 Atherosclerosis of aorta: Secondary | ICD-10-CM

## 2022-09-20 ENCOUNTER — Encounter: Attending: Emergency Medicine | Primary: Emergency Medicine

## 2022-09-24 ENCOUNTER — Ambulatory Visit
Admit: 2022-09-24 | Discharge: 2022-09-24 | Payer: MEDICARE | Attending: Emergency Medicine | Primary: Emergency Medicine

## 2022-09-24 DIAGNOSIS — J301 Allergic rhinitis due to pollen: Secondary | ICD-10-CM | POA: Diagnosis not present

## 2022-09-24 DIAGNOSIS — Z87891 Personal history of nicotine dependence: Secondary | ICD-10-CM | POA: Diagnosis not present

## 2022-09-24 DIAGNOSIS — I10 Essential (primary) hypertension: Secondary | ICD-10-CM | POA: Diagnosis not present

## 2022-09-24 DIAGNOSIS — Z0000 Encounter for general adult medical examination without abnormal findings: Secondary | ICD-10-CM | POA: Diagnosis not present

## 2022-09-24 DIAGNOSIS — Z86010 Personal history of colonic polyps: Secondary | ICD-10-CM | POA: Diagnosis not present

## 2022-09-24 DIAGNOSIS — N5201 Erectile dysfunction due to arterial insufficiency: Secondary | ICD-10-CM | POA: Diagnosis not present

## 2022-09-24 DIAGNOSIS — E7800 Pure hypercholesterolemia, unspecified: Secondary | ICD-10-CM | POA: Diagnosis not present

## 2022-09-24 DIAGNOSIS — Z125 Encounter for screening for malignant neoplasm of prostate: Secondary | ICD-10-CM | POA: Diagnosis not present

## 2022-09-24 DIAGNOSIS — Z8249 Family history of ischemic heart disease and other diseases of the circulatory system: Secondary | ICD-10-CM | POA: Diagnosis not present

## 2022-09-24 DIAGNOSIS — K2100 Gastro-esophageal reflux disease with esophagitis, without bleeding: Secondary | ICD-10-CM | POA: Diagnosis not present

## 2022-09-24 MED ORDER — TADALAFIL 20 MG PO TABS
20 | ORAL_TABLET | ORAL | 1 refills | 30.00000 days | Status: DC | PRN
Start: 2022-09-24 — End: 2023-10-16

## 2022-09-24 MED ORDER — FAMOTIDINE 40 MG PO TABS
40 MG | ORAL_TABLET | Freq: Every evening | ORAL | 3 refills | Status: DC
Start: 2022-09-24 — End: 2023-10-16

## 2022-09-24 MED ORDER — ATORVASTATIN CALCIUM 20 MG PO TABS
20 | ORAL_TABLET | Freq: Every day | ORAL | 3 refills | 90.00 days | Status: DC
Start: 2022-09-24 — End: 2023-10-07

## 2022-09-24 NOTE — Patient Instructions (Signed)
A Healthy Heart: Care Instructions  Overview     Coronary artery disease, also called heart disease, occurs when a substance called plaque builds up in the vessels that supply oxygen-rich blood to your heart muscle. This can narrow the blood vessels and reduce blood flow. A heart attack happens when blood flow is completely blocked. A high-fat diet, smoking, and other factors increase the risk of heart disease.  Your doctor has found that you have a chance of having heart disease. A heart-healthy lifestyle can help keep your heart healthy and prevent heart disease. This lifestyle includes eating healthy, being active, staying at a weight that's healthy for you, and not smoking or using tobacco. It also includes taking medicines as directed, managing other health conditions, and trying to get a healthy amount of sleep.  Follow-up care is a key part of your treatment and safety. Be sure to make and go to all appointments, and call your doctor if you are having problems. It's also a good idea to know your test results and keep a list of the medicines you take.  How can you care for yourself at home?  Diet   Use less salt when you cook and eat. This helps lower your blood pressure. Taste food before salting. Add only a little salt when you think you need it. With time, your taste buds will adjust to less salt.    Eat fewer snack items, fast foods, canned soups, and other high-salt, high-fat, processed foods.    Read food labels and try to avoid saturated and trans fats. They increase your risk of heart disease by raising cholesterol levels.    Limit the amount of solid fat--butter, margarine, and shortening--you eat. Use olive, peanut, or canola oil when you cook. Bake, broil, and steam foods instead of frying them.    Eat a variety of fruit and vegetables every day. Dark green, deep orange, red, or yellow fruits and vegetables are especially good for you. Examples include spinach, carrots, peaches, and  berries.    Foods high in fiber can reduce your cholesterol and provide important vitamins and minerals. High-fiber foods include whole-grain cereals and breads, oatmeal, beans, brown rice, citrus fruits, and apples.    Eat lean proteins. Heart-healthy proteins include seafood, lean meats and poultry, eggs, beans, peas, nuts, seeds, and soy products.    Limit drinks and foods with added sugar. These include candy, desserts, and soda pop.   Heart-healthy lifestyle   If your doctor recommends it, get more exercise. For many people, walking is a good choice. Or you may want to swim, bike, or do other activities. Bit by bit, increase the time you're active every day. Try for at least 30 minutes on most days of the week.    Try to quit or cut back on using tobacco and other nicotine products. This includes smoking and vaping. If you need help quitting, talk to your doctor about stop-smoking programs and medicines. These can increase your chances of quitting for good. Quitting is one of the most important things you can do to protect your heart. It is never too late to quit. Try to avoid secondhand smoke too.    Stay at a weight that's healthy for you. Talk to your doctor if you need help losing weight.    Try to get 7 to 9 hours of sleep each night.    Limit alcohol to 2 drinks a day for men and 1 drink a day for women. Too  much alcohol can cause health problems.    Manage other health problems such as diabetes, high blood pressure, and high cholesterol. If you think you may have a problem with alcohol or drug use, talk to your doctor.   Medicines   Take your medicines exactly as prescribed. Call your doctor if you think you are having a problem with your medicine.    If your doctor recommends aspirin, take the amount directed each day. Make sure you take aspirin and not another kind of pain reliever, such as acetaminophen (Tylenol).   When should you call for help?   Call 911 if you have symptoms of a heart  attack. These may include:   Chest pain or pressure, or a strange feeling in the chest.    Sweating.    Shortness of breath.    Pain, pressure, or a strange feeling in the back, neck, jaw, or upper belly or in one or both shoulders or arms.    Lightheadedness or sudden weakness.    A fast or irregular heartbeat.   After you call 911, the operator may tell you to chew 1 adult-strength or 2 to 4 low-dose aspirin. Wait for an ambulance. Do not try to drive yourself.  Watch closely for changes in your health, and be sure to contact your doctor if you have any problems.  Where can you learn more?  Go to [employer] and enter F075 to learn more about "A Healthy Heart: Care Instructions."  Current as of: June 24, 2023Content Version: 14.0   2006-2024 [employer].   Care instructions adapted under license by [facility]. If you have questions about a medical condition or this instruction, always ask your healthcare professional. [employer] disclaims any warranty or liability for your use of this information.      Personalized Preventive Plan for Dalton Bailey - 09/24/2022  Medicare offers a range of preventive health benefits. Some of the tests and screenings are paid in full while other may be subject to a deductible, co-insurance, and/or copay.    Some of these benefits include a comprehensive review of your medical history including lifestyle, illnesses that may run in your family, and various assessments and screenings as appropriate.    After reviewing your medical record and screening and assessments performed today your provider may have ordered immunizations, labs, imaging, and/or referrals for you.  A list of these orders (if applicable) as well as your Preventive Care list are included within your After Visit Summary for your review.    Other Preventive Recommendations:    A preventive eye exam performed by an eye specialist is  recommended every 1-2 years to screen for glaucoma; cataracts, macular degeneration, and other eye disorders.  A preventive dental visit is recommended every 6 months.  Try to get at least 150 minutes of exercise per week or 10,000 steps per day on a pedometer .  Order or download the FREE "Exercise & Physical Activity: Your Everyday Guide" from [employer]. Call 563-815-6103 or search [employer] online.  You need 1200-1500 mg of calcium and 1000-2000 IU of vitamin D per day. It is possible to meet your calcium requirement with diet alone, but a vitamin D supplement is usually necessary to meet this goal.  When exposed to the sun, use a sunscreen that protects against both UVA and UVB radiation with an SPF of 30 or greater. Reapply every 2 to 3 hours or after sweating, drying off  with a towel, or swimming.  Always wear a seat belt when traveling in a car. Always wear a helmet when riding a bicycle or motorcycle.

## 2022-09-24 NOTE — Progress Notes (Signed)
06/18/2022 Dr. Nelva Bush colonoscopy with 2 polypectomies, internal hemorrhoids recall 5 years path pending  Lab 0/16/2024 creatinine 1.0, glucose 111, LDL 97, PSA 1.3  Medicare Annual Wellness Visit    Dalton Bailey is here for Medicare AWV (F/U Medical Conditions)    1. Medicare annual wellness visit, subsequent  Comments:  History, Functional Ability, Depression and Health Risk assesed, as well as Medications reviewed including any opioid usage reviewed and discussed with patient  2. Screening for prostate cancer  Comments:  PSA and symptoms stable/reassuring  3. History of colon polyps  Comments:  Up-to-date  Overview:  "normal"  4. Family history of heart disease  Comments:  Exercising, all CV risk factors managed no chest pain  Orders:  -     EKG 12 Lead; Future  5. Hypercholesteremia  Comments:  At goal also takes omega-3 fatty acids and co-Q10  Overview:  Rx Atorvastatin 10 mg  Orders:  -     EKG 12 Lead; Future  -     atorvastatin (LIPITOR) 20 MG tablet; Take 1 tablet by mouth daily, Disp-100 tablet, R-3Normal  6. Primary hypertension  Comments:  At goal  7. Gastroesophageal reflux disease with esophagitis without hemorrhage  Comments:  On Pepcid  Overview:  without hemorrhage  Orders:  -     famotidine (PEPCID) 40 MG tablet; Take 1 tablet by mouth nightly, Disp-90 tablet, R-3Normal  8. Erectile dysfunction due to arterial insufficiency  Comments:  Refilled Cialis  Overview:  Rx Cialis 10mg   Orders:  -     tadalafil (CIALIS) 20 MG tablet; Take 1 tablet by mouth as needed for Erectile Dysfunction, Disp-30 tablet, R-1Normal  9. Seasonal allergic rhinitis due to pollen  Comments:  Topical therapy and avoid Sudafed  Overview:  Pollen related      Recommendations for Preventive Services Due: see orders and patient instructions/AVS.  Recommended screening schedule for the next 5-10 years is provided to the patient in written form: see Patient Instructions/AVS.     No follow-ups on file.     Subjective   HPI:    Symptom(s):   Dalton Bailey  is a pleasant  82 y.o. year-old patient who is new to the practice a year ago with a history of family history of heart disease, hypercholesterolemia and HTN who presents for an SAWV exam with labs.    Labs revealed everything stable (see PMH/comments section) creatinine 1.0, glucose 111 and LDL 97 with PSA 1.3.  Patient denies any hospitalizations or ER visits since last office visit. Specialists since the last visit include: Dr. Nelva Bush colonoscopy with 2 polypectomies recall 5 years if indicated.  New complaints: None, patient is doing well going to the gym, exercising remains sexually active with his girlfriend of 2 years (has known her for 9 years)  Most recent Medical problems: Patient has seasonal allergies and I rediscussed not taking Sudafed  Overdue tests/specialists: None  Additional chronic medical conditions managed include:  BPH mild/no meds/ED, DDD cervical spine, seasonal rhinitis history of GERD 2021 resolved, and insomnia/early awakening.  Normal follow-up every 6 months with labs.  Specialist(s):  Dr. 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 Nelva Bush current GI    History of nuclear stress test     normal ~2000    Hypercholesteremia     Rx Atorvastatin 10 mg    Medicare annual wellness visit, subsequent 09/24/2022    Lab 0/16/2024 creatinine 1.0, glucose 111,  LDL 97, PSA 1.3    Primary hypertension 2021    Seasonal allergic rhinitis     Pollen related    Senile purpura (HCC)      Past Surgical History:   Procedure Laterality Date    CATARACT EXTRACTION W/ INTRAOCULAR LENS IMPLANT  2015    COLONOSCOPY  06/18/2022    2 polyps with internal hemorrhoids Dr. Nelva Bush recall 5 years if indicated    ESOPHAGOGASTRODUODENOSCOPY  2022    Esophagitis NC with normal FU    FINGER TRIGGER RELEASE Bilateral 1997    5 different fingers    INGUINAL HERNIA REPAIR Right 2019    NC       Patient's complete Health Risk Assessment and screening values have been reviewed and are found in Flowsheets. The following problems were reviewed today and where indicated follow up appointments were made and/or referrals ordered.    No Positive Risk Factors identified today.      Past Medical History:   Diagnosis Date    Aortic atherosclerosis (HCC) 2022    Kidney CT incidental Ca aorta without aneurism    BPH (benign prostatic hyperplasia)     no Rxs    Chronic insomnia     early awakening    DDD (degenerative disc disease), cervical 05/23/2020    XR multilevel DDD/facet hypertrophy C4-7, anterolisthesis C3-4 and C4-5    ED (erectile dysfunction)     Rx Cialis 10mg     Family history of heart disease     M dec @ 13 (MI before 82 yo)    GERD with esophagitis 2021    without hemorrhage    History of colon polyps 06/2015    Dr. Nelva Bush current GI    History of nuclear stress test     normal ~2000    Hypercholesteremia     Rx Atorvastatin 10 mg    Medicare annual wellness visit, subsequent 09/24/2022    Lab 0/16/2024 creatinine 1.0, glucose 111, LDL 97, PSA 1.3    Primary hypertension 2021    Seasonal allergic rhinitis     Pollen related    Senile purpura (HCC)      Past Surgical History:   Procedure Laterality Date    CATARACT EXTRACTION W/ INTRAOCULAR LENS IMPLANT  2015    COLONOSCOPY  06/18/2022    2 polyps with internal hemorrhoids Dr. Nelva Bush recall 5 years if indicated    ESOPHAGOGASTRODUODENOSCOPY  2022     Esophagitis NC with normal FU    FINGER TRIGGER RELEASE Bilateral 1997    5 different fingers    INGUINAL HERNIA REPAIR Right 2019    NC     ROS:   General/Constitutional:   Patient denies fever, shakes or chills, excessive weight gain or  weight loss. Comments insomnia/awakens after 5 hours has oatmeal and goes back to sleep for 2 hours   Endocrine:   Patient denies excessive thirst, hunger or frequent urination.   Allergy/immunology:  Patient denies asthma, rheumatologic condition. Patient has seasonal allergies twice a year   Ophthalmologic:   Patient denies diminished visual acuity, eye pain or discharge.   ENT:   Patient denies ear pain, change in hearing, nosebleed or sore throat or difficulty swallowing.   Respiratory:   Patient denies cough, shortness of breath, wheezing or hemoptysis.   Cardiovascular:   Patient denies chest pain, palpatations or syncope.   Gastrointestinal:   Patient denies abdominal pain, nausea, vomiting or diarrhea, blood in stool. Comments history of colon polyps up-to-date  Men Only:   Patient BPH symptoms other than nocturia X1 and decreased stream, takes Cialis 20 mg, 1/2 tablet as needed   Musculoskeletal:   Patient denies leg swelling, painful or swollen joints.   Skin:   Patient denies rash.   Neurologic:   Patient denies dizziness, loss of use of extremity or focal neurologic deficit.   Psychiatric:   Patient denies anxiety, depression or suicidal thoughts.                            Objective   Vitals:    09/24/22 1559   BP: 121/72   Site: Left Upper Arm   Position: Sitting   Cuff Size: Small Adult   Pulse: 84   Resp: 16   Temp: 98 F (36.7 C)   TempSrc: Temporal   SpO2: 98%   Weight: 80.6 kg (177 lb 11.1 oz)   Height: 1.689 m (5' 6.5")      Body mass index is 28.25 kg/m.        Physical Examination:   GENERAL APPEARANCE: well developed and well nourished in NAD.   EYES: PERRLA, EOMI conjunctiva clear and sclera nonicteric.   EARS: canals clear and TM's are normal.   NOSE:  Bilateral nares are patent with normal inferior turbinates without drainage, erythema or masses.   THROAT: MMM, pharynx normal without erythema or exudate.   NECK: supple with FROM and no thyromegaly or mass, -carotid bruit or thrill .   HEART: RRR without murmurs, clicks or rubs.   LUNGS: CTA in all fields without rales, whezes or rubs.   ABDOMEN: soft with positive BS, nontender, no hepatosplenomegaly, no guarding or rebound and no masses or hernias.   MALE GENITOURINARY: not examined.   RECTAL EXAM: not examined.   EXTREMITIES: FROM without clubbing, cyanosis or edema.   BACK: FROM, no point tenderness, no bony stepoff or crepitus.   SKIN: warm and dry without rash or suspicious lesions.   VASCULAR: 2+ bilateral radial pulses, 2+ dorsalis pedis and posterior tibial pulses bilaterally date: 09/24/2022.   NEUROLOGIC: CN II - XII normal, motor strength 5/5 throughout and sensory exam intact.   PSYCH: cognitive function intact, judgement and insight good, mood/affect full range.       Allergies   Allergen Reactions    Codeine Hallucinations     Hyperactivity, hallucinations    Hydrocodone Other (See Comments)     hyperactivity     Prior to Visit Medications    Medication Sig Taking? Authorizing Provider   Cholecalciferol (VITAMIN D3) 50 MCG (2000 UT) CAPS Take by mouth Yes [provider]   Turmeric (QC TUMERIC COMPLEX PO) Take by mouth Yes [provider]  atorvastatin (LIPITOR) 20 MG tablet Take 1 tablet by mouth daily Yes Kennyth Arnold, MD   famotidine (PEPCID) 40 MG tablet Take 1 tablet by mouth nightly Yes Kennyth Arnold, MD   tadalafil (CIALIS) 20 MG tablet Take 1 tablet by mouth as needed for Erectile Dysfunction Yes Kennyth Arnold, MD   Multiple Vitamins-Minerals (MULTIVITAMIN ADULTS PO) Take 1 tablet by mouth Yes [provider]   Zinc 25 MG TABS Take 35 mg by mouth Yes [provider]   albuterol sulfate (PROAIR RESPICLICK) 108 (90 Base) MCG/ACT aerosol powder inhalation Inhale  into the lungs every 6 hours as needed Yes [provider]   ALPRAZolam (XANAX) 0.25 MG tablet Take 1 tablet by mouth daily as needed. 1 tablet Yes [provider]   ASPIRIN 81 PO Take 81 mg by mouth Yes [provider]   chlorhexidine (PERIDEX) 0.12 % solution Swish and spit 15 mLs 2 times daily Yes [provider]   Co-Enzyme Q10 100 MG CAPS Take by mouth Yes [provider]   OMEGA-3 FATTY ACIDS PO Take by mouth Yes [provider]   guaiFENesin (MUCINEX) 600 MG extended release tablet Take 2 tablets by mouth 2 times daily  Patient not taking: Reported on 09/24/2022  [provider]   guaiFENesin (ROBITUSSIN) 100 MG/5ML liquid Take 10 mLs by mouth 3 times daily as needed  Patient not taking: Reported on 09/24/2022  [provider]       CareTeam (Including outside providers/suppliers regularly involved in providing care):   Patient Care Team:  Kennyth Arnold, MD as PCP - General (General Practice)  Kennyth Arnold, MD as PCP - Empaneled Provider     Reviewed and updated this visit:  Tobacco  Allergies  Meds  Problems  Med Hx  Surg Hx  Soc Hx  Fam Hx           Medical decision making included assessment and treatment of problems: 2 or more stable chronic conditions (family history of heart disease, HTN, hypercholesterolemia, GERD, ED, seasonal rhinitis), analysis of information including: Recent labs reviewed, future labs ordered, records from other providers reviewed, and the overall risk was moderate

## 2022-09-27 ENCOUNTER — Encounter: Payer: Self-pay | Admitting: Family Medicine

## 2022-09-27 DIAGNOSIS — Z961 Presence of intraocular lens: Secondary | ICD-10-CM | POA: Diagnosis not present

## 2022-09-27 DIAGNOSIS — H26493 Other secondary cataract, bilateral: Secondary | ICD-10-CM | POA: Diagnosis not present

## 2022-10-29 DIAGNOSIS — Z7409 Other reduced mobility: Secondary | ICD-10-CM | POA: Diagnosis not present

## 2022-10-29 DIAGNOSIS — X58XXXA Exposure to other specified factors, initial encounter: Secondary | ICD-10-CM | POA: Diagnosis not present

## 2022-10-29 DIAGNOSIS — R102 Pelvic and perineal pain: Secondary | ICD-10-CM | POA: Diagnosis not present

## 2022-10-29 DIAGNOSIS — R55 Syncope and collapse: Secondary | ICD-10-CM | POA: Diagnosis not present

## 2022-10-29 DIAGNOSIS — W010XXA Fall on same level from slipping, tripping and stumbling without subsequent striking against object, initial encounter: Secondary | ICD-10-CM | POA: Diagnosis not present

## 2022-10-29 DIAGNOSIS — S76312A Strain of muscle, fascia and tendon of the posterior muscle group at thigh level, left thigh, initial encounter: Secondary | ICD-10-CM | POA: Diagnosis not present

## 2022-10-29 DIAGNOSIS — M79605 Pain in left leg: Secondary | ICD-10-CM | POA: Diagnosis not present

## 2022-10-29 DIAGNOSIS — M79652 Pain in left thigh: Secondary | ICD-10-CM | POA: Diagnosis not present

## 2022-10-29 DIAGNOSIS — S76812A Strain of other specified muscles, fascia and tendons at thigh level, left thigh, initial encounter: Secondary | ICD-10-CM | POA: Diagnosis not present

## 2022-10-29 NOTE — ED Provider Notes (Addendum)
ED Provider Note    History     Chief Complaint   Patient presents with   . Fall     Left leg pain. Denies LOC, head strike. Full memory of event. Reports left thigh pain.        82 year old gentleman history of for cholesterol for which she takes a statin.  Otherwise healthy and only takes multivitamins.  Denies history for heart disease, diabetes, or other metabolic disease.    Active vigorous 82 year old gentleman who appears younger than stated age.  States he was working outside and set the brake on his golf cart and the golf cart started to roll away as the brake was not fully engaged.  Patient states he took off running after his golf cart, got his toe caught in a tree root and tripped falling forward onto his hands and knees.  When he did so he said he felt something pull or pop in the back of his left leg in the hamstring area and was unable to initially get up.  He sat down on the ground, it was raining outside, he was able to motion a friend who was in another golf cart to come pick him up.    Patient was transported back home and then brought to the ED for evaluation.  Denies head strike, no chest pain, shortness of breath, abdominal pain or other issue.  Complains of pain in the back of the left leg in the hamstring area with movement and range of motion.    The history is provided by the patient.           No past medical history on file.    No past surgical history on file.    No family history on file.         Review of Systems   Constitutional:  Negative for fever.   Respiratory:  Negative for shortness of breath.    Cardiovascular:  Negative for chest pain.   Gastrointestinal:  Negative for abdominal pain, diarrhea and vomiting.   Musculoskeletal:  Positive for myalgias.       Physical Exam     ED Triage Vitals [10/29/22 1335]   BP Pulse Resp Temp Temp src SpO2   -- (!) 125 (!) 32 -- -- 90 %       Physical Exam  Vitals reviewed.   Constitutional:       Appearance: Normal appearance.       Comments: Appears younger than stated age, certainly did not expect him to be 82 years old.   HENT:      Head: Normocephalic and atraumatic.      Mouth/Throat:      Mouth: Mucous membranes are moist.      Pharynx: Oropharynx is clear.   Cardiovascular:      Rate and Rhythm: Normal rate and regular rhythm.      Pulses: Normal pulses.      Heart sounds: Normal heart sounds.   Pulmonary:      Effort: Pulmonary effort is normal.      Breath sounds: Normal breath sounds.   Abdominal:      Palpations: Abdomen is soft.      Tenderness: There is no abdominal tenderness.   Musculoskeletal:      Cervical back: Normal range of motion and neck supple.      Comments: Apprehensive range of motion of the left leg due to pain in the hamstring area.  Slight fullness to the distal  hamstring with concern for proximal hamstring tear.  I think less likely to be limited range of motion from hip or pelvic fracture.   Neurological:      General: No focal deficit present.      Mental Status: He is alert.   Psychiatric:         Mood and Affect: Mood normal.         ED Documentation - Procedures and MDM   Procedures    Medical Decision Making  Critical and common diagnoses were considered for the patient's presenting complaints and symptoms.  All labs and imaging during the ED encounter were ordered to rule out emergent etiologies of the patient's condition. Differential Diagnosis (including but not limited to): Hamstring tear, doubt hip fracture, doubt femur fracture, heat related illness, clinically does not sound like syncope    Amount and/or Complexity of Data Reviewed  Labs:  Decision-making details documented in ED Course.  Radiology:  Decision-making details documented in ED Course.  ECG/medicine tests: independent interpretation performed. Decision-making details documented in ED Course.     Details: ECG normal sinus rhythm rate of 80, normal intervals, left anterior fascicular block, no definite hypertrophy, no acute ST-T wave  changes.             Clinical & Imaging Tests: Ordered & Reviewed      Labs     CBC AND DIFFERENTIAL - Abnormal       Result Value Ref Range    White Blood Cell Count 12.3 (*) 4.8 - 10.8 10*3/uL    Red Blood Cells Count 4.28 (*) 4.70 - 6.10 10*6/uL    Hemoglobin 13.4 (*) 14.0 - 18.0 g/dL    Hematocrit 16.1 (*) 42.0 - 52.0 %    Mean Corpuscular Volume 90.4  80.0 - 94.0 fL    Mean Corpuscular Hemoglobin 31.3 (*) 27.0 - 31.0 pg    Mean Corpuscular Hemoglobin Conc 34.6 (*) 30.7 - 34.4 g/dL    Red Cell Distribution Width 12.1  11.5 - 14.5 %    Nucleated Red Blood Cells, Relative Percent 0  0 - 2 /100 WBCs    Nucleated Red Blood Cells, Absolute Count 0.000  0.000 - 1.000 10*3/uL    Platelet Count 193  140 - 440 10*3/uL    Mean Platelet Volume 9.8  9.2 - 12.3 fL    Neutrophils Relative Percent 88.3 (*) 50.0 - 75.0 %    Lymphocytes Relative Percent 5.3 (*) 20.0 - 45.0 %    Monocytes Relative Percent 5.6  0.0 - 10.0 %    Eosinophils  Relative Percent 0.2  0.0 - 5.0 %    Basophils Relative Percent 0.2  0.0 - 2.0 %    Immature Granulocytes Relative Percent 0.4  0.0 - 0.6 %    Neutrophils Absolute Count 10.8 (*) 2.4 - 8.1 10*3/uL    Lymphocytes Absolute Count 0.7 (*) 1.0 - 4.9 10*3/uL    Monocytes Absolute Count 0.7  0.0 - 1.1 10*3/uL    Eosinophils Absolute Count 0.0  0.0 - 0.5 10*3/uL    Basophils Absolute Count 0.0  0.0 - 0.2 10*3/uL    Immature Granulocytes Absolute Count 0.05  0.00 - 0.06 10*3/uL   COMPREHENSIVE METABOLIC PANEL - Abnormal    Sodium 135 (*) 136 - 145 mmol/L    Potassium 4.2  3.5 - 5.1 mmol/L    Chloride 104  98 - 107 mmol/L    CO2 24.0  22.0 - 30.0 mmol/L  Anion Gap 7.0  2.0 - 11.0 mmol/L    Glucose 116 (*) 74 - 106 mg/dL    Urea Nitrogen, Blood 30 (*) 9 - 20 mg/dL    Creatinine 0.9  0.7 - 1.3 mg/dL    EGFR >19.1  >=47.8 GN/FAO/1.30 sq.m    Calcium 9.0  8.4 - 10.4 mg/dL    Bilirubin, Total 0.5  0.2 - 1.3 mg/dL    AST (SGOT) 32  17 - 59 U/L    ALT (SGPT) 25  0 - 50 U/L    Alkaline Phosphatase 46  45 - 117  U/L    Protein, Total 7.1  6.3 - 8.2 g/dL    Albumin 4.1  3.5 - 5.0 g/dL    Albumin / Globulin Ratio 1.37  0.80 - 2.40   ADULT ROUTINE EKG - Abnormal    HEART RATE 80  bpm    RR INTERVAL 750  ms    ATRIAL RATE 80  ms    P-R INTERVAL 196  ms    P DURATION 117  ms    P HORIZONTAL AXIS -26  deg    P FRONT AXIS 50  deg    Q ONSET 504  ms    QRSD INTERVAL 104  ms    Q-T INTERVAL 384  ms    QTcB 443  ms    QTcF 423  ms    QRS HORIZONTAL AXIS 262  deg    QRS AXIS -45  deg    I-40 HORIZONTAL AXIS 17  deg    I-40 FRONT AXIS 62  deg    T-40 HORIZONTAL AXIS 240  deg    T-40 FRONT AXIS -51  deg    T HORIZONTAL AXIS 45  deg    T WAVE AXIS 51  deg    S-T HORIZONTAL AXIS 93  deg    S-T FRONT AXIS 132  deg    SEVERITY - ABNORMAL ECG - (*)    PROTIME-INR - Normal    PT 14.5  11.9 - 15.3 seconds    INR 1.1  0.8 - 1.2 INR   APTT - Normal    aPTT 25  24 - 38 seconds   CK - Normal    Creatine Kinase Total 153  55 - 170 U/L   XR FEMUR LEFT AP AND LATERAL   XR PELVIS AP ONLY   ACCESS (INSERT) PERIPHERAL IV       Imaging - WET OR FINAL READ    Imaging Results              XR Pelvis AP Only (Final result)  Result time 10/29/22 14:30:25   Procedure changed from XR Hip Left AP and Lateral     Final result by Frances Furbish, MD (10/29/22 14:30:25)                   Impression:    No fracture or malalignment on single frontal view of the pelvis.    Dictating Workstation: [id number]               Narrative:    EXAMINATION:  Pelvis One View    CLINICAL HISTORY:  fall, pain; hip fx vs hamstring tear    COMPARISON:  None.    TECHNIQUE:  A single, AP image of the pelvis is performed.    FINDINGS:  There is no fracture or malalignment on this single frontal view. Surgical clips in the pelvis.  XR Femur Left AP And Lateral (Final result)  Result time 10/29/22 14:29:54      Final result by Frances Furbish, MD (10/29/22 14:29:54)                   Impression:    No acute osseous  abnormality.    Dictating Workstation: [id number]               Narrative:    EXAMINATION:  Left Femur Two Views    CLINICAL HISTORY:  fall, pain; hip fx vs hamstring tear    COMPARISON:  None.    TECHNIQUE:  AP and lateral images of the left femur are performed.    FINDINGS:  There is no fracture. Alignment is normal. Vascular calcifications.                                      ED Medications: Ordered, Reviewed & Administered     ED Medications  Medications   sodium chloride 0.9% flush (PF) syringe 5 mL (5 mL Intravenous Not Given 10/29/22 1501)   sodium chloride 0.9% flush (PF) syringe 5 mL (5 mL Intravenous Not Given 10/29/22 1502)   NaCl 0.9% bolus 500 mL (0 mL Intravenous Stopped 10/29/22 1548)   ketorolac (Toradol) 30mg /mL injection 15 mg (15 mg Intravenous Given 10/29/22 1456)       Home Medications  No current outpatient medications on file.       ED Consults   No data to display    Decision Support              .               Clinical Opiate Withdrawal (If Applicable):        ED Course Notes     ED Course as of 10/29/22 1552   Mon Oct 29, 2022   1550 Workup unremarkable for AKI, rhabdomyolysis or fracture.    Workup is consistent with mild dehydration and likely partial tear of hamstring muscles.    Patient feels much better after IV Toradol and modest fluid bolus.Patient will be placed on crutches, Motrin at home with oxycodone for breakthrough pain after discussion with him and fianc.  Patient will be referred for orthopedic follow-up and discharged in good condition as per my typical routine. [JR]      ED Course User Index  [JR] Dalene Seltzer, MD         Clinical Impressions as of 10/29/22 1552   Syncope   Tear of left hamstring, initial encounter       Plan               Dalene Seltzer, MD  10/29/22 1551       Dalene Seltzer, MD  10/29/22 1654

## 2022-11-05 ENCOUNTER — Ambulatory Visit
Admit: 2022-11-05 | Discharge: 2022-11-05 | Payer: MEDICARE | Attending: Emergency Medicine | Primary: Emergency Medicine

## 2022-11-05 DIAGNOSIS — S76312D Strain of muscle, fascia and tendon of the posterior muscle group at thigh level, left thigh, subsequent encounter: Secondary | ICD-10-CM | POA: Diagnosis not present

## 2022-11-05 MED ORDER — METHOCARBAMOL 500 MG PO TABS
500 | ORAL_TABLET | Freq: Three times a day (TID) | ORAL | 1 refills | Status: AC | PRN
Start: 2022-11-05 — End: 2022-12-05

## 2022-11-05 MED ORDER — MELOXICAM 7.5 MG PO TABS
7.5 | ORAL_TABLET | Freq: Two times a day (BID) | ORAL | 2 refills | Status: AC | PRN
Start: 2022-11-05 — End: ?

## 2022-11-05 NOTE — Progress Notes (Signed)
Chief Complaint   Patient presents with    Leg Injury     Leg hamstring tear 1 week ago       HPI:   Symptom(s):   Dalton Bailey is a pleasant  82 y.o. year-old patient (new to the practice a year ago) with a history of: Hypercholesterolemia, HTN and Fhx early cardiac disease presents for a new complaint of : Torn left hamstring/ER follow-up.    ER follow-up appointment.  ER DATE OF VISIT: 10/29/2022 [facility]  ER Complaint: Running and tripped felt a pull and pain to hamstring  Evaluation/testing: Plain x-rays pelvis and left femur NAD.  IV Toradol 15 mg.  Rx Oxycodone  Discharge diagnosis/treatment plan: Torn hamstring Ortho follow-up  Condition since ER discharge: Patient called the orthopedist and he reviewed the notes and felt nothing surgical indicated not seen in person.  Patient called me unable to tolerate Oxycodone recommended OTC ibuprofen 600 mg which helps temporarily.  Also recommended 6 inch Ace wrap.  He has been using his crutches.  Still has pain proximal left buttock with large resolving hematoma.  Using compression and has ordered a compression sleeve.  Labs revealed everything stable during [facility] 09/2022 (see PMH/comments section).  Patient denies any hospitalizations or having seen any specialists since the last visit.  Most recent Medical problems: Colonoscopy with 2 polypectomies 06/18/2022  Overdue tests/specialists: None  Lifestyle note: patient is doing well going to the gym, exercising remains sexually active with his girlfriend of 2 years (has known her for 9 years)   Additional chronic medical conditions managed include: BPH mild/no meds/ED, DDD cervical spine, Seasonal rhinitis, Hx GERD 2021 resolved, and Insomnia/early awakening.   Normal follow-up every 6 months with labs.  Specialist(s):  Dr. Nelva Bush colonoscopy with 2 polypectomies 05/2022 recall 5 years if indicated  Test(s):  CT abdomen and pelvis 10/2020 incidental finding calcified aorta without aneurysm     Current Outpatient Medications    Medication Sig Note Dispense Refill    Magnesium 300 MG CAPS Take by mouth 11/05/2022: Taking 150mg Twice daily      methocarbamol (ROBAXIN) 500 MG tablet Take 1 tablet by mouth 3 times daily as needed (spasms)  60 tablet 1    meloxicam (MOBIC) 7.5 MG tablet Take 1 tablet by mouth every 12 hours as needed for Pain  60 tablet 2    Cholecalciferol (VITAMIN D3) 50 MCG (2000 UT) CAPS Take by mouth       Turmeric (QC TUMERIC COMPLEX PO) Take by mouth       atorvastatin (LIPITOR) 20 MG tablet Take 1 tablet by mouth daily  100 tablet 3    famotidine (PEPCID) 40 MG tablet Take 1 tablet by mouth nightly  90 tablet 3    Multiple Vitamins-Minerals (MULTIVITAMIN ADULTS PO) Take 1 tablet by mouth       Zinc 25 MG TABS Take 35 mg by mouth       albuterol sulfate (PROAIR RESPICLICK) 108 (90 Base) MCG/ACT aerosol powder inhalation Inhale into the lungs every 6 hours as needed 09/24/2022: As needed      ALPRAZolam (XANAX) 0.25 MG tablet Take 1 tablet by mouth daily as needed. 1 tablet 09/24/2022: As needed      ASPIRIN 81 PO Take 81 mg by mouth       chlorhexidine (PERIDEX) 0.12 % solution Swish and spit 15 mLs 2 times daily 09/24/2022: As needed      Co-Enzyme Q10 100 MG CAPS Take by mouth  guaiFENesin (MUCINEX) 600 MG extended release tablet Take 2 tablets by mouth 2 times daily       guaiFENesin (ROBITUSSIN) 100 MG/5ML liquid Take 10 mLs by mouth 3 times daily as needed       OMEGA-3 FATTY ACIDS PO Take by mouth       tadalafil (CIALIS) 20 MG tablet Take 1 tablet by mouth as needed for Erectile Dysfunction  30 tablet 1     No current facility-administered medications for this visit.     Allergies   Allergen Reactions    Codeine Hallucinations     Hyperactivity, hallucinations    Hydrocodone Other (See Comments)     hyperactivity     Family History: reviewed/updated as indicated  Social History: reviewed/updated as indicated  Past Medical History:   Diagnosis Date    Aortic atherosclerosis (HCC) 2022    Kidney CT incidental Ca  aorta without aneurism    BPH (benign prostatic hyperplasia)     no Rxs    Chronic insomnia     early awakening    DDD (degenerative disc disease), cervical 05/23/2020    XR multilevel DDD/facet hypertrophy C4-7, anterolisthesis C3-4 and C4-5    ED (erectile dysfunction)     Rx Cialis 10mg     Family history of heart disease     M dec @ 43 (MI before 82 yo)    GERD with esophagitis 2021    without hemorrhage    History of colon polyps 06/2015    Dr. Nelva Bush current GI    History of nuclear stress test     normal ~2000    Hypercholesteremia     Rx Atorvastatin 10 mg    Medicare annual wellness visit, subsequent 09/24/2022    Lab 0/16/2024 creatinine 1.0, glucose 111, LDL 97, PSA 1.3    Primary hypertension 2021    Seasonal allergic rhinitis     Pollen related    Senile purpura (HCC)      Past Surgical History:   Procedure Laterality Date    CATARACT EXTRACTION W/ INTRAOCULAR LENS IMPLANT  2015    COLONOSCOPY  06/18/2022    2 polyps with internal hemorrhoids Dr. Nelva Bush recall 5 years if indicated    ESOPHAGOGASTRODUODENOSCOPY  2022    Esophagitis NC with normal FU    FINGER TRIGGER RELEASE Bilateral 1997    5 different fingers    INGUINAL HERNIA REPAIR Right 2019    NC         ROS:   General/Constitutional:   Patient denies fever, shakes or chills, excessive weight gain or weight loss. Comments insomnia/awakens after 5 hours has oatmeal and goes back to sleep for 2 hours   Endocrine:   Patient denies excessive thirst, hunger or frequent urination.   Allergy/immunology:  Patient denies asthma, rheumatologic condition. Patient has seasonal allergies twice a year   Ophthalmologic:   Patient denies diminished visual acuity, eye pain or discharge.   Respiratory:   Patient denies cough, shortness of breath, wheezing or hemoptysis.   Cardiovascular:   Patient denies chest pain, palpatations or syncope.   Gastrointestinal:   Patient denies abdominal pain, nausea, vomiting or diarrhea, blood in stool. Comments history of colon  polyps up-to-date  Men Only:   Patient BPH symptoms other than nocturia X1 and decreased stream, takes Cialis 20 mg, 1/2 tablet as needed   Musculoskeletal:   Patient denies leg swelling, painful or swollen joints.  Complains of left hamstring pain/buttock pain with some radiation to  the anterior leg  Skin:   Patient denies rash.   Neurologic:   Patient denies dizziness, loss of use of extremity or focal neurologic deficit.   Psychiatric:   Patient denies anxiety, depression or suicidal thoughts.    Vital signs:  BP 120/80 (Site: Left Upper Arm, Position: Sitting, Cuff Size: Large Adult)   Pulse 92   Temp 98.4 F (36.9 C) (Temporal)   Resp 16   Wt 79 kg (174 lb 2.6 oz)   SpO2 98%   BMI 27.69 kg/m     Physical Examination:   GENERAL APPEARANCE: well developed and well nourished in NAD.   HEAD: Atraumatic and normocephalic  EYES: PERRLA, EOMI conjunctiva clear and sclera nonicteric.   NECK: supple with FROM and no thyromegaly or mass, -carotid bruit or thrill .   EXTREMITIES: FROM without clubbing, cyanosis or edema.  Left medial proximal hamstring rupture with soft tissue defect and retracted muscle body.  No signs of infection.  Good ROM hip and knee  BACK: FROM, no point tenderness, no bony stepoff or crepitus.   SKIN: warm and dry without rash or suspicious lesions.  Large ecchymosis left posterior medial thigh purpleish consistent with 1 week  NEUROLOGIC: CN II - XII normal, motor strength 5/5 throughout and sensory exam intact.   PSYCH: cognitive function intact, judgement and insight good, mood/affect full range.  PROCEDURE:  Replaced 6 inch Ace wrap left thigh  Assessment/Plan:  1. Rupture of left hamstring tendon, subsequent encounter  Comments:  Recommended minimal weightbearing with crutches, Ortho check in person with Dr. Noralyn Pick.  Change Ibuprofen to Meloxicam, okay to take as needed Robaxin  Orders:  -     methocarbamol (ROBAXIN) 500 MG tablet; Take 1 tablet by mouth 3 times daily as needed  (spasms), Disp-60 tablet, R-1Normal  -     [facility] - Roxanne Mins MD, Orthopaedics [facility]    Time documentation as follows: 10 minutes reviewing old records, labs and previous visit note, 10 minutes discussing history of present illness/complaint as well as discussing treatment and plan, 5 minutes performing pertinent exam, 5 minutes documenting chart for total time of 30 minutes  Follow-up and Dispositions    Return in about 4 months (around 03/07/2023) for Follow-up with labs.     Z6109 Visit complexity inherent to evaluation and management associated with medical care services that serve as the continuing focal point for all needed health care services and/or with medical care services that are part of ongoing care related to a patients single, serious condition or a complex condition delineated above

## 2022-11-06 NOTE — Telephone Encounter (Signed)
Patient is being referred for hamstring injury, will Dr. Noralyn Pick see this?

## 2022-11-12 ENCOUNTER — Ambulatory Visit: Admit: 2022-11-12 | Discharge: 2022-11-12 | Payer: MEDICARE | Attending: Medical | Primary: Emergency Medicine

## 2022-11-12 DIAGNOSIS — S76312A Strain of muscle, fascia and tendon of the posterior muscle group at thigh level, left thigh, initial encounter: Secondary | ICD-10-CM | POA: Diagnosis not present

## 2022-11-12 NOTE — Progress Notes (Signed)
[facility]  Dept: [phone]   Provider: Henrene Hawking, PA-C  Date: 11/12/2022    Patient Name: Dalton Bailey, Age: 82 y.o., Sex: male, DOB: 1941/04/11  Address:   [address]  [address] [place] [address]   Phone: [phone] (home) [phone] (work)  PCP: Kennyth Arnold, MD   Insurance: Payor: Monia Pouch MEDICARE / Plan: Charlotte Crumb PPO / Product Type: Medicare /     Chief Complaint   New Patient (Left knee/Leg injury )    History of Presenting Illness   Body Part: (P) Hip, Knee, Ankle, Foot (P) Left  Location of Pain/Injury: (P) Inside  Date of Onset/Injury: (P) 10/29/2022  Injury: (P) caught foot on  large tree root  Symptoms: (P) Aching  Did you hear a "pop" when you injured your knee?: (P) No  Did your knee immediately swell?: (P) No  Exacerbating Factors: (P) Bending/Kneeling/Squatting  Does it feel stiff if you sit for a long period of time? : (P) No  Does it hurt going up and down stairs?: (P) Yes  Pain at night: (P) Yes  Rate of Pain: (P) 5  Previous Treatments: (P) Bracing, Icing, Warm Compresses  OTC Prescription: (P) Tylenol  Recent Imaging: (P) Yes  Previous Surgery: (P) No    Dalton Bailey is a 82 y.o. male seen in consultation for a left thigh injury at the request of Dr. Kennyth Arnold MD.  He injured his posterior left thigh 2 weeks ago running after golf cart.  His cart rolled away and while he chased it he  tripped on tree root and fell onto hands and knees.  He felt pop in back of left leg with immediate posterior left thigh pain.  The pain was severe to the point that he was losing consciousness and 911 was called who took him to [place].  XR negative and he has been using crutches, acetaminophen, meloxicam, methocarbinol, ice, heat.  His pain is improving.  He attempted to follow up with a different orthopedic group and was told he did not need to be seen.  Dr. Lonzo Cloud referred the patient to our practice for further evaluation.    Past Medical History      Past Medical History:   Diagnosis Date    Aortic atherosclerosis (HCC) 2022    Kidney CT incidental Ca aorta without aneurism    BPH (benign prostatic hyperplasia)     no Rxs    Chronic insomnia     early awakening    DDD (degenerative disc disease), cervical 05/23/2020    XR multilevel DDD/facet hypertrophy C4-7, anterolisthesis C3-4 and C4-5    ED (erectile dysfunction)     Rx Cialis 10mg     Family history of heart disease     M dec @ 62 (MI before 82 yo)    GERD with esophagitis 2021    without hemorrhage    History of colon polyps 06/2015    Dr. Nelva Bush current GI    History of nuclear stress test     normal ~2000    Hypercholesteremia     Rx Atorvastatin 10 mg    Medicare annual wellness visit, subsequent 09/24/2022    Lab 0/16/2024 creatinine 1.0, glucose 111, LDL 97, PSA 1.3    Primary hypertension 2021    Seasonal allergic rhinitis     Pollen related    Senile purpura (HCC)        Home Medications     Current Outpatient Medications:  Magnesium 300 MG CAPS, Take by mouth, Disp: , Rfl:     methocarbamol (ROBAXIN) 500 MG tablet, Take 1 tablet by mouth 3 times daily as needed (spasms), Disp: 60 tablet, Rfl: 1    meloxicam (MOBIC) 7.5 MG tablet, Take 1 tablet by mouth every 12 hours as needed for Pain, Disp: 60 tablet, Rfl: 2    Cholecalciferol (VITAMIN D3) 50 MCG (2000 UT) CAPS, Take by mouth, Disp: , Rfl:     Turmeric (QC TUMERIC COMPLEX PO), Take by mouth, Disp: , Rfl:     atorvastatin (LIPITOR) 20 MG tablet, Take 1 tablet by mouth daily, Disp: 100 tablet, Rfl: 3    famotidine (PEPCID) 40 MG tablet, Take 1 tablet by mouth nightly, Disp: 90 tablet, Rfl: 3    Multiple Vitamins-Minerals (MULTIVITAMIN ADULTS PO), Take 1 tablet by mouth, Disp: , Rfl:     Zinc 25 MG TABS, Take 35 mg by mouth, Disp: , Rfl:     albuterol sulfate (PROAIR RESPICLICK) 108 (90 Base) MCG/ACT aerosol powder inhalation, Inhale into the lungs every 6 hours as needed, Disp: , Rfl:     ALPRAZolam (XANAX) 0.25 MG tablet, Take 1 tablet by  mouth daily as needed. 1 tablet, Disp: , Rfl:     ASPIRIN 81 PO, Take 81 mg by mouth, Disp: , Rfl:     chlorhexidine (PERIDEX) 0.12 % solution, Swish and spit 15 mLs 2 times daily, Disp: , Rfl:     Co-Enzyme Q10 100 MG CAPS, Take by mouth, Disp: , Rfl:     guaiFENesin (MUCINEX) 600 MG extended release tablet, Take 2 tablets by mouth 2 times daily, Disp: , Rfl:     guaiFENesin (ROBITUSSIN) 100 MG/5ML liquid, Take 10 mLs by mouth 3 times daily as needed, Disp: , Rfl:     OMEGA-3 FATTY ACIDS PO, Take by mouth, Disp: , Rfl:     tadalafil (CIALIS) 20 MG tablet, Take 1 tablet by mouth as needed for Erectile Dysfunction, Disp: 30 tablet, Rfl: 1     Past Surgical History     Past Surgical History:   Procedure Laterality Date    CATARACT EXTRACTION W/ INTRAOCULAR LENS IMPLANT  2015    COLONOSCOPY  06/18/2022    2 polyps with internal hemorrhoids Dr. Nelva Bush recall 5 years if indicated    ESOPHAGOGASTRODUODENOSCOPY  2022    Esophagitis NC with normal FU    FINGER TRIGGER RELEASE Bilateral 1997    5 different fingers    INGUINAL HERNIA REPAIR Right 2019    NC       Allergies     Allergies   Allergen Reactions    Codeine Hallucinations     Hyperactivity, hallucinations    Hydrocodone Other (See Comments)     hyperactivity        Social History     Social History     Occupational History    Not on file   Tobacco Use    Smoking status: Former     Current packs/day: 0.00     Average packs/day: 0.5 packs/day for 3.0 years (1.5 ttl pk-yrs)     Types: Cigarettes     Start date: 05/21/1958     Quit date: 05/21/1961     Years since quitting: 61.5     Passive exposure: Past    Smokeless tobacco: Never   Vaping Use    Vaping Use: Never used   Substance and Sexual Activity    Alcohol use: Yes  Comment: 1 glass per week    Drug use: Never    Sexual activity: Yes     Partners: Female     Comment: FriendVelna Hatchet       Family History   He indicated that his mother is deceased. He indicated that his father is deceased. He indicated that his  sister is alive. He indicated that both of his sons are alive.    Review of Systems   A ten point review of systems was performed and negative unless noted otherwise in the HPI.    Physical Examination   There were no vitals filed for this visit., There is no height or weight on file to calculate BMI.  Constitutional: well developed, well nourished, in no acute distress.  Head: normocephalic, atraumatic.   Eyes: pupils equal, round, sclera non-icteric.   Ears: Hearing normal.   Neck: neck supple, full range of motion.   Cardiovascular: regular rate and rhythm.   Pulmonary: non-laborous,good air movement.   Gastrointestinal: soft, nontender, nondistended.   Neuro: Alert and oriented.  Psych: Appropriate  Musculoskeletal:  Left thigh: There is ecchymosis over the distal half of the posterior thigh.  No palpable deficits.  Semimembranosus, semitendinosus, and biceps femoris tendons are intact.  Full range of motion of the hip and knee in all planes.  5/5 strength of the hip with extension with the knee extended.  5/5 hip extension the knee flexed and also 5/5 knee flexion.  Calf is soft and nontender.  Neurovascularly intact distally.    Imaging/Labs   Pelvis and left femur XR, 10/29/22, [facility]: Images are not available for review.  Per radiology report, no fractures, dislocations, or other acute abnormalities.    Assessment & Plan   1. Strain of left hamstring muscle, initial encounter  -     External Referral To Physical Therapy    The patient presents with a moderate left hamstring strain which was explained.  He has no palpable defects, his pain is improving, and he is able to ambulate without crutches, though he continues to use them out of precaution.  He should continue meloxicam prescribed by Dr. Lonzo Cloud and has been referred to physical therapy at [facility] in [place].  He may wean from the crutches when he is able to walk with minimal limping.  Continue ice, heat, compression.  Follow-up in 4-5 weeks.    The patient  was provided the following instructions:  Patient Instructions   -Take acetaminophen as needed for pain relief.  Do not exceed 4,000 mg per 24-hour period.  -Attend physical therapy.  -Perform daily exercises.  -Use ice,  heat, and compression as needed.  -Use crutches until you can walk with minimal limping.     Return in about 4 weeks (around 12/10/2022). Patient to follow-up sooner with any problems, questions, concerns, or worsening symptoms.    Orders Placed This Encounter    External Referral To Physical Therapy     Referral Priority:   Routine     Referral Type:   Eval and Treat     Referral Reason:   Specialty Services Required     Requested Specialty:   Physical Therapist     Number of Visits Requested:   1       Electronically signed by Rochel Brome, PA on 11/14/2022 at 6:27 AM    This document was created using voice recognition software so mistakes are possible. For any concerns about the wording of this document, please contact its  creator for further clarification.

## 2022-11-12 NOTE — Patient Instructions (Signed)
-  Take acetaminophen as needed for pain relief.  Do not exceed 4,000 mg per 24-hour period.  -Attend physical therapy.  -Perform daily exercises.  -Use ice,  heat, and compression as needed.  -Use crutches until you can walk with minimal limping.

## 2022-11-20 ENCOUNTER — Other Ambulatory Visit: Payer: Self-pay | Admitting: Family Medicine

## 2022-11-20 DIAGNOSIS — I700 Atherosclerosis of aorta: Secondary | ICD-10-CM

## 2022-11-20 DIAGNOSIS — E785 Hyperlipidemia, unspecified: Secondary | ICD-10-CM

## 2022-11-29 DIAGNOSIS — S76312A Strain of muscle, fascia and tendon of the posterior muscle group at thigh level, left thigh, initial encounter: Secondary | ICD-10-CM | POA: Diagnosis not present

## 2022-11-30 ENCOUNTER — Encounter: Payer: MEDICARE | Primary: Emergency Medicine

## 2022-12-03 DIAGNOSIS — S76312A Strain of muscle, fascia and tendon of the posterior muscle group at thigh level, left thigh, initial encounter: Secondary | ICD-10-CM | POA: Diagnosis not present

## 2022-12-04 ENCOUNTER — Ambulatory Visit
Admit: 2022-12-04 | Discharge: 2022-12-05 | Payer: MEDICARE | Attending: Emergency Medicine | Primary: Emergency Medicine

## 2022-12-04 DIAGNOSIS — I10 Essential (primary) hypertension: Secondary | ICD-10-CM | POA: Diagnosis not present

## 2022-12-04 DIAGNOSIS — S76312D Strain of muscle, fascia and tendon of the posterior muscle group at thigh level, left thigh, subsequent encounter: Secondary | ICD-10-CM | POA: Diagnosis not present

## 2022-12-04 NOTE — Progress Notes (Signed)
11/14/2022 [place] Duff, PA evaluation left thigh injury running after a golf cart tripped felt a pop in the back of the leg.  Left hamstring strain-continue meloxicam, PT with [facility] recheck 4 to 5 weeks  Chief Complaint   Patient presents with    Edema     Left ankle pain. Left buttocks pain       HPI:   Symptom(s):   Dalton Bailey is a pleasant  82 y.o. year-old patient with Hx of: Left hamstring muscle tear 10/29/2022 initially treated through the [facility] and seen by myself on the 17th.  I have also given him a referral and he saw [place] Duff 11/14/2022.  He is doing well wearing a thigh brace but his PT noticed LLE swelling and recommended he check with his PCP.  Patient actually thinks the swelling is better than it has been over the last couple of months.  He denies any calf pain, chest pain or shortness of breath.  Overdue tests/specialists: None  Lifestyle note: patient is doing well going to the gym, exercising remains sexually active with his girlfriend of 2 years (has known her for 9 years).  Going to [place] in the near future for girlfriends biannual ear check and then spending a few days in [place]  Additional chronic medical conditions managed include:  BPH mild/no meds/ED, DDD cervical spine, Seasonal rhinitis, Hx GERD 2021 resolved, and Insomnia/early awakening.   Normal follow-up every 6 months with labs.  Specialist(s):  Dr. Nelva Bush colonoscopy with 2 polypectomies 05/2022 recall 5 years if indicated  Test(s):  CT abdomen and pelvis 10/2020 incidental finding calcified aorta without aneurysm       Current Outpatient Medications   Medication Sig Note Dispense Refill    Magnesium 300 MG CAPS Take by mouth 11/05/2022: Taking 150mg Twice daily      methocarbamol (ROBAXIN) 500 MG tablet Take 1 tablet by mouth 3 times daily as needed (spasms)  60 tablet 1    meloxicam (MOBIC) 7.5 MG tablet Take 1 tablet by mouth every 12 hours as needed for Pain  60 tablet 2    Cholecalciferol  (VITAMIN D3) 50 MCG (2000 UT) CAPS Take by mouth       Turmeric (QC TUMERIC COMPLEX PO) Take by mouth       atorvastatin (LIPITOR) 20 MG tablet Take 1 tablet by mouth daily  100 tablet 3    famotidine (PEPCID) 40 MG tablet Take 1 tablet by mouth nightly  90 tablet 3    Multiple Vitamins-Minerals (MULTIVITAMIN ADULTS PO) Take 1 tablet by mouth       Zinc 25 MG TABS Take 35 mg by mouth       albuterol sulfate (PROAIR RESPICLICK) 108 (90 Base) MCG/ACT aerosol powder inhalation Inhale into the lungs every 6 hours as needed 09/24/2022: As needed      ALPRAZolam (XANAX) 0.25 MG tablet Take 1 tablet by mouth daily as needed. 1 tablet 09/24/2022: As needed      ASPIRIN 81 PO Take 81 mg by mouth       chlorhexidine (PERIDEX) 0.12 % solution Swish and spit 15 mLs 2 times daily 09/24/2022: As needed      Co-Enzyme Q10 100 MG CAPS Take by mouth       guaiFENesin (MUCINEX) 600 MG extended release tablet Take 2 tablets by mouth 2 times daily       guaiFENesin (ROBITUSSIN) 100 MG/5ML liquid Take 10 mLs by mouth 3 times daily as needed  OMEGA-3 FATTY ACIDS PO Take by mouth       tadalafil (CIALIS) 20 MG tablet Take 1 tablet by mouth as needed for Erectile Dysfunction  30 tablet 1     No current facility-administered medications for this visit.     Allergies   Allergen Reactions    Codeine Hallucinations     Hyperactivity, hallucinations    Hydrocodone Other (See Comments)     hyperactivity     Family History: reviewed/updated as indicated  Social History: reviewed/updated as indicated  Past Medical History:   Diagnosis Date    Aortic atherosclerosis (HCC) 2022    Kidney CT incidental Ca aorta without aneurism    BPH (benign prostatic hyperplasia)     no Rxs    Chronic insomnia     early awakening    DDD (degenerative disc disease), cervical 05/23/2020    XR multilevel DDD/facet hypertrophy C4-7, anterolisthesis C3-4 and C4-5    ED (erectile dysfunction)     Rx Cialis 10mg     Family history of heart disease     M dec @ 35 (MI before  82 yo)    GERD with esophagitis 2021    without hemorrhage    History of colon polyps 06/2015    Dr. Nelva Bush current GI    History of nuclear stress test     normal ~2000    Hypercholesteremia     Rx Atorvastatin 10 mg    Medicare annual wellness visit, subsequent 09/24/2022    Lab 0/16/2024 creatinine 1.0, glucose 111, LDL 97, PSA 1.3    Primary hypertension 2021    Seasonal allergic rhinitis     Pollen related    Senile purpura (HCC)      Past Surgical History:   Procedure Laterality Date    CATARACT EXTRACTION W/ INTRAOCULAR LENS IMPLANT  2015    COLONOSCOPY  06/18/2022    2 polyps with internal hemorrhoids Dr. Nelva Bush recall 5 years if indicated    ESOPHAGOGASTRODUODENOSCOPY  2022    Esophagitis NC with normal FU    FINGER TRIGGER RELEASE Bilateral 1997    5 different fingers    INGUINAL HERNIA REPAIR Right 2019    NC         ROS:   General/Constitutional:   Patient denies fever, shakes or chills, excessive weight gain or weight loss. Comments insomnia/awakens after 5 hours has oatmeal and goes back to sleep for 2 hours   Endocrine:   Patient denies excessive thirst, hunger or frequent urination.   Allergy/immunology:  Patient denies asthma, rheumatologic condition. Patient has seasonal allergies twice a year   Ophthalmologic:   Patient denies diminished visual acuity, eye pain or discharge.   Respiratory:   Patient denies cough, shortness of breath, wheezing or hemoptysis.   Cardiovascular:   Patient denies chest pain, palpatations or syncope.   Gastrointestinal:   Patient denies abdominal pain, nausea, vomiting or diarrhea, blood in stool. Comments history of colon polyps up-to-date  Men Only:   Patient BPH symptoms other than nocturia X1 and decreased stream, takes Cialis 20 mg, 1/2 tablet as needed   Musculoskeletal:   Patient denies leg swelling, painful or swollen joints.  Comments 1 month s/p left hamstring tear.  Skin:   Patient denies rash.   Neurologic:   Patient denies dizziness, loss of use of  extremity or focal neurologic deficit.   Psychiatric:   Patient denies anxiety, depression or suicidal thoughts.    Vital signs:  BP 120/71 (Site:  Left Upper Arm, Position: Sitting, Cuff Size: Small Adult)   Pulse 92   Temp 98.2 F (36.8 C) (Temporal)   Resp 16   Wt 81.5 kg (179 lb 10.8 oz)   SpO2 98%   BMI 28.57 kg/m     Physical Examination:   GENERAL APPEARANCE: well developed and well nourished in NAD.   HEAD: Atraumatic and normocephalic  EYES: PERRLA, EOMI conjunctiva clear and sclera nonicteric.   NECK: supple with FROM and no thyromegaly or mass, -carotid bruit or thrill .   EXTREMITIES: FROM without clubbing, cyanosis or edema.  Left medial proximal hamstring muscle tear healing well resolution of previous soft tissue swelling and bruising. Good ROM hip and knee.  Thigh sleeve in place.  Minimal dependent edema left ankle without signs of DVT  BACK: FROM, no point tenderness, no bony stepoff or crepitus.   SKIN: warm and dry without rash or suspicious lesions.    NEUROLOGIC: CN II - XII normal, motor strength 5/5 throughout and sensory exam intact.   PSYCH: cognitive function intact, judgement and insight good, mood/affect full range.    Assessment/Plan:  1. Rupture of left hamstring tendon, subsequent encounter  Comments:  Healing as expected, much better than prior visit.  Minimal reactive edema no signs of DVT or concern  2. Primary hypertension  Comments:  Controlled  Time documentation as follows: 6 minutes reviewing old records, labs and previous visit note, 10 minutes discussing history of present illness/complaint as well as discussing treatment and plan, 5 minutes performing pertinent exam, 5 minutes documenting chart for total time of 26 minutes    Follow-up and Dispositions    Return in about 3 months (around 03/07/2023) for Follow-up with labs as scheduled and sooner any problems.

## 2022-12-06 DIAGNOSIS — S76312A Strain of muscle, fascia and tendon of the posterior muscle group at thigh level, left thigh, initial encounter: Secondary | ICD-10-CM | POA: Diagnosis not present

## 2022-12-10 DIAGNOSIS — S76312A Strain of muscle, fascia and tendon of the posterior muscle group at thigh level, left thigh, initial encounter: Secondary | ICD-10-CM | POA: Diagnosis not present

## 2022-12-27 DIAGNOSIS — S76312A Strain of muscle, fascia and tendon of the posterior muscle group at thigh level, left thigh, initial encounter: Secondary | ICD-10-CM | POA: Diagnosis not present

## 2022-12-31 DIAGNOSIS — S76312A Strain of muscle, fascia and tendon of the posterior muscle group at thigh level, left thigh, initial encounter: Secondary | ICD-10-CM | POA: Diagnosis not present

## 2023-01-01 ENCOUNTER — Encounter: Admit: 2023-01-01 | Discharge: 2023-01-01 | Payer: MEDICARE | Attending: Medical | Primary: Emergency Medicine

## 2023-01-01 DIAGNOSIS — S76312D Strain of muscle, fascia and tendon of the posterior muscle group at thigh level, left thigh, subsequent encounter: Secondary | ICD-10-CM | POA: Diagnosis not present

## 2023-01-01 DIAGNOSIS — S93402A Sprain of unspecified ligament of left ankle, initial encounter: Secondary | ICD-10-CM | POA: Diagnosis not present

## 2023-01-01 NOTE — Patient Instructions (Addendum)
-  Take acetaminophen as needed for pain relief.  Do not exceed 4,000 mg per 24-hour period.  -Attend physical therapy.  -Perform daily exercises.  -Use ice,  heat, and compression as needed.  -Gradually increase activity as tolerated.

## 2023-01-01 NOTE — Progress Notes (Unsigned)
[facility]  Dept: [phone]   Provider: Henrene Hawking, PA-C  Date: 01/01/2023    Patient Name: Dalton Bailey, Age: 82 y.o., Sex: male, DOB: 1940-06-27  Address:   [address]  [address] [place] [address]   Phone: [phone] (home) [phone] (work)  PCP: Kennyth Arnold, MD   Insurance: Payor: Monia Pouch MEDICARE / Plan: Charlotte Crumb PPO / Product Type: Medicare /     Chief Complaint   No chief complaint on file.    Subjective   Mr. Traymon Goedeke is a 82 y.o. male who is following up for a left hamstring strain and mid June 2024.  He has been treated with physical therapy at [facility] in [place], [place], meloxicam, Tylenol, ice, and heat.  He was also provided crutches until able to walk without a limp.    Physical Examination   Left thigh: There is ecchymosis over the distal half of the posterior thigh.  No palpable deficits.  Semimembranosus, semitendinosus, and biceps femoris tendons are intact.  Full range of motion of the hip and knee in all planes.  5/5 strength of the hip with extension with the knee extended.  5/5 hip extension the knee flexed and also 5/5 knee flexion.  Calf is soft and nontender.  Neurovascularly intact distally.***    Imaging/Labs   Pelvis and left femur XR, 10/29/22, [facility]: Images are not available for review.  Per radiology report, no fractures, dislocations, or other acute abnormalities.    Assessment & Plan   1. Strain of left hamstring muscle, subsequent encounter    ***  The patient presents with a moderate left hamstring strain which was explained.  He has no palpable defects, his pain is improving, and he is able to ambulate without crutches, though he continues to use them out of precaution.  He should continue meloxicam prescribed by Dr. Lonzo Cloud and has been referred to physical therapy at [facility] in [place].  He may wean from the crutches when he is able to walk with minimal limping.  Continue ice, heat, compression.  Follow-up in 4-5  weeks.    The patient was provided the following instructions:  There are no Patient Instructions on file for this visit.    No follow-ups on file. Patient to follow-up sooner with any problems, questions, concerns, or worsening symptoms.    No orders of the defined types were placed in this encounter.      Electronically signed by Rochel Brome, PA on 12/26/2022 at 12:32 PM    This document was created using voice recognition software so mistakes are possible. For any concerns about the wording of this document, please contact its creator for further clarification.

## 2023-01-03 DIAGNOSIS — S76312A Strain of muscle, fascia and tendon of the posterior muscle group at thigh level, left thigh, initial encounter: Secondary | ICD-10-CM | POA: Diagnosis not present

## 2023-01-07 DIAGNOSIS — S76312A Strain of muscle, fascia and tendon of the posterior muscle group at thigh level, left thigh, initial encounter: Secondary | ICD-10-CM | POA: Diagnosis not present

## 2023-01-10 DIAGNOSIS — S76312A Strain of muscle, fascia and tendon of the posterior muscle group at thigh level, left thigh, initial encounter: Secondary | ICD-10-CM | POA: Diagnosis not present

## 2023-01-14 DIAGNOSIS — S76312A Strain of muscle, fascia and tendon of the posterior muscle group at thigh level, left thigh, initial encounter: Secondary | ICD-10-CM | POA: Diagnosis not present

## 2023-01-17 DIAGNOSIS — S76312A Strain of muscle, fascia and tendon of the posterior muscle group at thigh level, left thigh, initial encounter: Secondary | ICD-10-CM | POA: Diagnosis not present

## 2023-01-22 ENCOUNTER — Telehealth

## 2023-01-22 MED ORDER — BENZONATATE 100 MG PO CAPS
100 | ORAL_CAPSULE | Freq: Three times a day (TID) | ORAL | 0 refills | Status: AC | PRN
Start: 2023-01-22 — End: 2023-02-01

## 2023-01-22 NOTE — Telephone Encounter (Signed)
 [facility] calls to request a refill Rx Dalton Bailey sent to [facility]

## 2023-01-22 NOTE — Telephone Encounter (Signed)
 States that he has cough due to seasonal allergies, that he has used [facility] for cough previously. States that COVID test is negative.

## 2023-02-20 ENCOUNTER — Encounter: Admit: 2023-02-20 | Discharge: 2023-02-20 | Payer: MEDICARE | Primary: Emergency Medicine

## 2023-02-20 DIAGNOSIS — E7800 Pure hypercholesterolemia, unspecified: Secondary | ICD-10-CM | POA: Diagnosis not present

## 2023-02-20 DIAGNOSIS — R5383 Other fatigue: Secondary | ICD-10-CM | POA: Diagnosis not present

## 2023-02-20 LAB — LIPID PANEL
Chol/HDL Ratio: 3.8 (ref 0.0–4.4)
Cholesterol, Total: 160 mg/dL (ref 100–200)
HDL: 42 mg/dL (ref >=40)
LDL Cholesterol: 101 mg/dL — ABNORMAL HIGH (ref 0.0–100.0)
LDL/HDL Ratio: 2.4
Triglycerides: 85 mg/dL (ref 0–149)
VLDL: 17 mg/dL (ref 5.0–40.0)

## 2023-02-20 LAB — HEPATIC FUNCTION PANEL
ALT: 24 U/L (ref 0–50)
AST: 27 U/L (ref 0–50)
Albumin: 4.1 g/dL (ref 3.5–5.2)
Alk Phosphatase: 66 U/L (ref 40–130)
Bilirubin, Direct: <0.2 mg/dL (ref 0.00–0.30)
Total Bilirubin: 0.59 mg/dL (ref 0.00–1.20)
Total Protein: 7.2 g/dL (ref 5.7–8.3)

## 2023-02-25 LAB — TESTOSTERONE, FREE: Testosterone, Free: 3.8 pg/mL — ABNORMAL LOW (ref 6.6–18.1)

## 2023-02-26 NOTE — Telephone Encounter (Signed)
-----   Message from Dr. 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  He is a candidate for replacement.  We can schedule that when I get back or one of the covering doctors or urology referral.  Thanks,  Dr. Bea Laura  ----- Message -----  From: Edi, [facility]  Sent: 02/20/2023   7:57 PM EDT  To: Kennyth Arnold, MD

## 2023-03-07 ENCOUNTER — Encounter: Admit: 2023-03-07 | Discharge: 2023-03-07 | Payer: MEDICARE | Primary: Emergency Medicine

## 2023-03-07 DIAGNOSIS — E7800 Pure hypercholesterolemia, unspecified: Secondary | ICD-10-CM | POA: Diagnosis not present

## 2023-03-07 DIAGNOSIS — Z23 Encounter for immunization: Secondary | ICD-10-CM | POA: Diagnosis not present

## 2023-03-07 DIAGNOSIS — K2100 Gastro-esophageal reflux disease with esophagitis, without bleeding: Secondary | ICD-10-CM | POA: Diagnosis not present

## 2023-03-07 DIAGNOSIS — N528 Other male erectile dysfunction: Secondary | ICD-10-CM | POA: Diagnosis not present

## 2023-03-07 NOTE — Progress Notes (Unsigned)
[facility]  Dalton Najjar, Dalton Bailey   [address] [place] [address]  Phone:  [phone]  Fax:  [phone]    CHIEF COMPLAINT:  No chief complaint on file.       HISTORY OF PRESENT ILLNESS:  Dalton Bailey is a 82 y.o. male  who presents for follow-up.  Tolerating his statin and lipid panel has been at goal.  Denies heartburn on daily H2 therapy.    REVIEW OF SYSTEMS:  Review of Systems   Constitutional:  Negative for chills and fever.   HENT:  Negative for congestion, postnasal drip, sneezing and sore throat.    Eyes:  Negative for discharge, itching and visual disturbance.   Respiratory:  Negative for cough, chest tightness and shortness of breath.    Cardiovascular:  Negative for chest pain and palpitations.   Gastrointestinal:  Negative for abdominal pain, constipation and diarrhea.   Endocrine: Negative.    Genitourinary:  Negative for dysuria and hematuria.   Musculoskeletal:  Negative for arthralgias and joint swelling.   Skin:  Negative for pallor and rash.   Allergic/Immunologic: Negative for environmental allergies.   Neurological:  Negative for dizziness and headaches.   Hematological: Negative.    Psychiatric/Behavioral:  Negative for dysphoric mood. The patient is not nervous/anxious.       PHYSICAL EXAM:  Vital Signs:  There were no vitals filed for this visit.     Physical Exam  Vitals and nursing note reviewed.   Constitutional:       General: He is not in acute distress.     Appearance: Normal appearance.   HENT:      Head: Normocephalic and atraumatic.      Right Ear: External ear normal. There is no impacted cerumen.      Left Ear: External ear normal. There is no impacted cerumen.      Nose: Nose normal. No congestion or rhinorrhea.      Mouth/Throat:      Mouth: Mucous membranes are moist.      Pharynx: Oropharynx is clear. No oropharyngeal exudate or posterior oropharyngeal erythema.   Eyes:      General: No scleral icterus.        Right eye: No discharge.          Left eye: No discharge.      Extraocular Movements: Extraocular movements intact.      Conjunctiva/sclera: Conjunctivae normal.      Pupils: Pupils are equal, round, and reactive to light.   Neck:      Vascular: No carotid bruit.   Cardiovascular:      Rate and Rhythm: Normal rate and regular rhythm.      Pulses: Normal pulses.      Heart sounds: No murmur heard.     No friction rub. No gallop.   Pulmonary:      Effort: Pulmonary effort is normal.      Breath sounds: Normal breath sounds. No wheezing, rhonchi or rales.   Abdominal:      General: Bowel sounds are normal.      Palpations: Abdomen is soft. There is no mass.      Tenderness: There is no abdominal tenderness. There is no guarding.   Musculoskeletal:         General: No swelling or tenderness. Normal range of motion.      Cervical back: Neck supple. No rigidity.      Right lower  leg: No edema.      Left lower leg: No edema.   Lymphadenopathy:      Cervical: No cervical adenopathy.   Skin:     General: Skin is warm and dry.      Findings: No rash.   Neurological:      General: No focal deficit present.      Mental Status: He is alert and oriented to person, place, and time.   Psychiatric:         Mood and Affect: Mood normal.         Behavior: Behavior normal.         Thought Content: Thought content normal.        PHQ:      09/24/2022     4:09 PM   PHQ-9    Little interest or pleasure in doing things 0   Feeling down, depressed, or hopeless 0   PHQ-2 Score 0   PHQ-9 Total Score 0       Nurse Only on 02/20/2023   Component Date Value Ref Range Status    Cholesterol, Total 02/20/2023 160  100 - 200 mg/dL Final    Comment: The National Cholesterol Education Program has published reference  cholesterol values for cardiovascular risk to be:    Less than 200 mg/dL     = Low Risk    086 to 239 mg/dL        = Borderline Risk    240mg /dL and greater    = High Risk      HDL 02/20/2023 42  >=40 mg/dL Final    Comment: The National Lipid Association and the  [employer] Cholesterol Education Program  (NCEP) have set the guidelines for high-density lipoprotein (HDL) cholesterol  in adults ages 49 and up.      Triglycerides 02/20/2023 85  0 - 149 mg/dL Final    Comment:   TRIGLYCERIDE INTERPRETATION:                          Recommended Fasting Triglyc Levels for Adults                        =============================================                        Desirable                        < 150 mg/dL                        Average                          < 200 mg/dL                        Borderline High             200 to 500 mg/dL                        Hypertriglyceridemic             > 500 mg/dL                        =============================================      LDL Cholesterol 02/20/2023 101.0 (  H)  0.0 - 100.0 mg/dL Final    LDL/HDL Ratio 02/20/2023 2.4   Final    Chol/HDL Ratio 02/20/2023 3.8  0.0 - 4.4 Final    VLDL 02/20/2023 17.0  5.0 - 40.0 mg/dL Final    Total Protein 02/20/2023 7.2  5.7 - 8.3 g/dL Final    Albumin 16/02/9603 4.1  3.5 - 5.2 g/dL Final    Total Bilirubin 02/20/2023 0.59  0.00 - 1.20 mg/dL Final    Alk Phosphatase 02/20/2023 66  40 - 130 unit/L Final    Bilirubin, Direct 02/20/2023 <0.20  0.00 - 0.30 mg/dL Final    Comment: Bilirubin Indirect unable to be calculated because Bilirubin Direct is less  than 0.2 mg/dL.      AST 02/20/2023 27  0 - 50 unit/L Final    ALT 02/20/2023 24  0 - 50 unit/L Final    Bilirubin, Indirect 02/20/2023 NOTE  0.00 - 1.00 mg/dL Final    Unable to calculate Bili Ind Calc    Testosterone, Free 02/20/2023 3.8 (L)  6.6 - 18.1 pg/mL Final    Comment: Performed At: [facility]  [address] [place], [place] [id number]  Jolene Schimke MD YN:[id number]  Reference Interval:  Male:   20-29 years: 9.3-26.5 pg/mL   30-39 years: 8.7-25.1 pg/mL   40-49 years: 6.8-21.5 pg/mL   50-59 years: 7.2-24.0 pg/mL   >59 years: 6.6-18.1 pg/mL  Male:   20-59 years: 0.0-2.2 pg/mL   >59 years: 0.0-1.8 pg/mL          The ASCVD  Risk score (Arnett DK, et al., 2019) failed to calculate for the following reasons:    The 2019 ASCVD risk score is only valid for ages 14 to 20     IMPRESSION/PLAN:  There are no diagnoses linked to this encounter.      Celine Mans Makenlee Mckeag, Dalton Bailey    This note was typed and/or dictated by Dragon Naturally Speaking at the point-of-care.  Reasonable attempt at proofreading has been made; however, occasional wrong-word or `sound-a-like substitutions persist due to the inherent limitations of voice recognition software.  Please notify the [facility] if any discrepancies are noted or if the meaning of any statement is unclear.

## 2023-03-13 DIAGNOSIS — N5203 Combined arterial insufficiency and corporo-venous occlusive erectile dysfunction: Secondary | ICD-10-CM | POA: Diagnosis not present

## 2023-03-13 DIAGNOSIS — E291 Testicular hypofunction: Secondary | ICD-10-CM | POA: Diagnosis not present

## 2023-03-14 DIAGNOSIS — E291 Testicular hypofunction: Secondary | ICD-10-CM | POA: Diagnosis not present

## 2023-03-21 DIAGNOSIS — N5203 Combined arterial insufficiency and corporo-venous occlusive erectile dysfunction: Secondary | ICD-10-CM | POA: Diagnosis not present

## 2023-03-21 DIAGNOSIS — E291 Testicular hypofunction: Secondary | ICD-10-CM | POA: Diagnosis not present

## 2023-04-11 ENCOUNTER — Encounter: Admit: 2023-04-11 | Payer: MEDICARE | Primary: Emergency Medicine

## 2023-04-11 VITALS — BP 136/68 | HR 81 | Temp 98.60000°F | Resp 16 | Wt 178.1 lb

## 2023-04-11 DIAGNOSIS — J343 Hypertrophy of nasal turbinates: Secondary | ICD-10-CM | POA: Diagnosis not present

## 2023-04-11 DIAGNOSIS — R509 Fever, unspecified: Secondary | ICD-10-CM | POA: Diagnosis not present

## 2023-04-11 DIAGNOSIS — R051 Acute cough: Secondary | ICD-10-CM | POA: Diagnosis not present

## 2023-04-11 DIAGNOSIS — R062 Wheezing: Secondary | ICD-10-CM | POA: Diagnosis not present

## 2023-04-11 DIAGNOSIS — J301 Allergic rhinitis due to pollen: Secondary | ICD-10-CM | POA: Diagnosis not present

## 2023-04-11 LAB — AMB POC COVID-19 & INFLUENZA A/B
INFLUENZA A RNA, POC: DETECTED
INFLUENZA B RNA, POC: NOT DETECTED
SARS-COV-2 RNA, POC: NEGATIVE

## 2023-04-11 MED ORDER — PREDNISONE 20 MG PO TABS
20 | ORAL_TABLET | Freq: Every day | ORAL | 0 refills | Status: AC
Start: 2023-04-11 — End: 2023-04-16

## 2023-04-11 MED ORDER — AZITHROMYCIN 250 MG PO TABS
250 | ORAL_TABLET | ORAL | 0 refills | Status: AC
Start: 2023-04-11 — End: 2023-04-21

## 2023-04-11 MED ORDER — ALBUTEROL SULFATE HFA 108 (90 BASE) MCG/ACT IN AERS
108 (90 Base) MCG/ACT | Freq: Four times a day (QID) | RESPIRATORY_TRACT | 1 refills | Status: DC | PRN
Start: 2023-04-11 — End: 2023-10-28

## 2023-04-11 NOTE — Progress Notes (Signed)
 [facility]  Peyton Najjar, DO   [address], [place] [address]  Phone:  [phone]  Fax:  [phone]    CHIEF COMPLAINT:  Chief Complaint   Patient presents with    Cough     Reports cough, yellow nasal secretio

## 2023-04-16 NOTE — Telephone Encounter (Signed)
 Pt is still wheezing and has a cough . He has finished all the medication but he wants to know what else he has to do

## 2023-06-03 ENCOUNTER — Telehealth

## 2023-06-03 MED ORDER — OMEPRAZOLE 40 MG PO CPDR
40 MG | ORAL_CAPSULE | Freq: Every day | ORAL | 1 refills | Status: DC
Start: 2023-06-03 — End: 2023-10-16

## 2023-06-03 NOTE — Telephone Encounter (Signed)
Medication was prescribed by previous provider.

## 2023-07-16 ENCOUNTER — Encounter: Payer: Self-pay | Admitting: Internal Medicine

## 2023-07-30 NOTE — Telephone Encounter (Signed)
 Spoke with patient re: list of medications that he dropped off at office 07/29/2023 for Dr. Lonzo Cloud to review. Patient requesting appt. to discuss facial ticks with Dr. Lonzo Cloud. Appt. scheduled.

## 2023-07-31 ENCOUNTER — Ambulatory Visit
Admit: 2023-07-31 | Discharge: 2023-07-31 | Payer: MEDICARE | Attending: Emergency Medicine | Primary: Emergency Medicine

## 2023-07-31 VITALS — BP 131/84 | HR 67 | Temp 98.10000°F | Resp 16 | Wt 182.4 lb

## 2023-07-31 DIAGNOSIS — F959 Tic disorder, unspecified: Secondary | ICD-10-CM

## 2023-07-31 NOTE — Progress Notes (Signed)
 CHIEF COMPLAINT:  Chief Complaint   Patient presents with    Other     Facial tics     HPI:   Symptom(s):   Dalton Bailey is a pleasant  83 y.o. year-old patient with Hx of: Hypercholesterolemia, HTN and FHx early cardiac disease presents with a New complaint(s) of: #1 facial tics for 3 to 4 weeks.  Tics seem to be periorbital.  Patient denies any stimulant medications other than couple of caffeinated beverages (coffee and tea throughout the day/gets headaches if tries to quit) but no escalation in use.  He takes multiple OTC meds but list reviewed and nothing seem to be stimulant related.  He had tics as a child but they seem to go away as an adult until recently.  He does have some stresses in his life but nothing severe.  #2 pulled a muscle in his right thigh a month ago still causing him discomfort along the right iliotibial band iliotibial band.  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  Saw Georgia Duff, PA last year with left hamstring tear  Interval history from last visit: Patient denies any hospitalizations or ER visits since last office visit.    Previous visit complaints: Patient called in January requesting Rx of Omeprazole for flareup of GERD which we sent in and symptoms stable.  Saw Dr. Dimple Casey when I was out for [facility] and offered urology referral for low testosterone-not sure if that was accomplished  Most recent Labs: revealed everything stable (see PMH/comments section) 02/20/2023 LDL 101, testosterone free 3.8  Specialists since the last visit include: (dates below): N/A.  Overdue tests/specialists: None  Lifestyle note: Patient going to the gym/personal trainer, sexually active girlfriend of 3 years (has known her for 10 years)  Additional chronic medical conditions managed include: BPH mild/no meds/ED, DDD Cervical spine, Seasonal rhinitis, Hx GERD 2021 resolved, and Insomnia/early awakening.   Normal follow-up every 6 months with labs.  Specialist(s):  Dr. Nelva Bush  colonoscopy with 2 polypectomies 05/2022 recall 5 years if indicated  Test(s):  CT abdomen and pelvis 10/2020 incidental finding calcified aorta without aneurysm     Current Outpatient Medications   Medication Sig Note Dispense Refill    omeprazole (PRILOSEC) 40 MG delayed release capsule Take 1 capsule by mouth every morning (before breakfast)  90 capsule 1    KRILL OIL PO Take by mouth       Cholecalciferol (VITAMIN D3) 50 MCG (2000 UT) CAPS Take by mouth       Turmeric (QC TUMERIC COMPLEX PO) Take by mouth       atorvastatin (LIPITOR) 20 MG tablet Take 1 tablet by mouth daily  100 tablet 3    famotidine (PEPCID) 40 MG tablet Take 1 tablet by mouth nightly  90 tablet 3    Multiple Vitamins-Minerals (MULTIVITAMIN ADULTS PO) Take 1 tablet by mouth       Zinc 25 MG TABS Take 35 mg by mouth       albuterol sulfate (PROAIR RESPICLICK) 108 (90 Base) MCG/ACT aerosol powder inhalation Inhale into the lungs every 6 hours as needed 09/24/2022: As needed      ALPRAZolam (XANAX) 0.25 MG tablet Take 1 tablet by mouth daily as needed. 1 tablet 09/24/2022: As needed      ASPIRIN 81 PO Take 81 mg by mouth       chlorhexidine (PERIDEX) 0.12 % solution Swish and spit 15 mLs 2 times daily 09/24/2022: As needed  Co-Enzyme Q10 100 MG CAPS Take by mouth       benzonatate (TESSALON) 100 MG capsule Take 1 capsule by mouth 3 times daily as needed for Cough (Patient not taking: Reported on 07/31/2023) 07/31/2023: Has on hand      albuterol sulfate HFA (VENTOLIN HFA) 108 (90 Base) MCG/ACT inhaler Inhale 2 puffs into the lungs 4 times daily as needed for Wheezing or Shortness of Breath (Cough)  1 each 1    tadalafil (CIALIS) 20 MG tablet Take 1 tablet by mouth as needed for Erectile Dysfunction  30 tablet 1    guaiFENesin (MUCINEX) 600 MG extended release tablet Take 2 tablets by mouth 2 times daily (Patient not taking: Reported on 07/31/2023) 03/07/2023: As needed      guaiFENesin (ROBITUSSIN) 100 MG/5ML liquid Take 10 mLs by mouth 3 times daily  as needed (Patient not taking: Reported on 07/31/2023) 03/07/2023: As needed       No current facility-administered medications for this visit.     Allergies   Allergen Reactions    Codeine Hallucinations     Hyperactivity, hallucinations    Hydrocodone Other (See Comments)     hyperactivity     Family History: reviewed/updated as indicated  Social History: reviewed/updated as indicated  Past Medical History:   Diagnosis Date    Aortic atherosclerosis 2022    Kidney CT incidental Ca aorta without aneurism    BPH (benign prostatic hyperplasia)     no Rxs    Chronic insomnia     early awakening    DDD (degenerative disc disease), cervical 05/23/2020    XR multilevel DDD/facet hypertrophy C4-7, anterolisthesis C3-4 and C4-5    ED (erectile dysfunction)     Rx Cialis 10mg     Family history of heart disease     M dec @ 29 (MI before 83 yo)    GERD with esophagitis 2021    without hemorrhage    History of colon polyps 06/2015    Dr. Nelva Bush current GI    History of nuclear stress test     normal ~2000    Hypercholesteremia     Rx Atorvastatin 10 mg    Medicare annual wellness visit, subsequent 09/24/2022    02/20/2023 LDL 101, testosterone free 3.8    Primary hypertension 2021    Seasonal allergic rhinitis     Pollen related    Senile purpura      Past Surgical History:   Procedure Laterality Date    BRAIN SURGERY      CATARACT EXTRACTION W/ INTRAOCULAR LENS IMPLANT  2015    COLONOSCOPY  06/18/2022    2 polyps with internal hemorrhoids Dr. Nelva Bush recall 5 years if indicated    ESOPHAGOGASTRODUODENOSCOPY  2022    Esophagitis NC with normal FU    FINGER TRIGGER RELEASE Bilateral 1997    5 different fingers    INGUINAL HERNIA REPAIR Right 2019    NC       ROS:     Vital signs:  ROS:   General/Constitutional:   Patient denies fever, shakes or chills, excessive weight gain or weight loss. Comments insomnia/awakens after 5 hours has oatmeal and goes back to sleep for 2 hours   Endocrine:   Patient denies excessive thirst, hunger  or frequent urination.   Allergy/immunology:  Patient denies asthma, rheumatologic condition. Patient has seasonal allergies twice a year   Ophthalmologic:   Patient denies diminished visual acuity, eye pain or discharge.   Respiratory:  Patient denies cough, shortness of breath, wheezing or hemoptysis.   Cardiovascular:   Patient denies chest pain, palpatations or syncope.   Gastrointestinal:   Patient denies abdominal pain, nausea, vomiting or diarrhea, blood in stool. Comments history of colon polyps up-to-date  Men Only:   Patient BPH symptoms other than nocturia X1 and decreased stream, takes Cialis 20 mg, 1/2 tablet as needed   Musculoskeletal:   Patient denies leg swelling, painful or swollen joints.  Complains of 1 month right lateral thigh tightness/discomfort  skin:   Patient denies rash.   Neurologic:   Patient denies dizziness, loss of use of extremity or focal neurologic deficit.  Complains of 1 month facial tics/periorbital (history of similar problems as a child)  Psychiatric:   Patient denies anxiety, depression or suicidal thoughts.     BP 131/84 (BP Site: Left Upper Arm, Patient Position: Sitting, BP Cuff Size: Large Adult)   Pulse 67   Temp 98.1 F (36.7 C) (Temporal)   Resp 16   Wt 82.7 kg (182 lb 6.4 oz)   SpO2 98%   BMI 29.00 kg/m      Physical Examination:   GENERAL APPEARANCE: well developed and well nourished in NAD.   HEAD: Atraumatic and normocephalic  EYES: PERRLA, EOMI conjunctiva clear and sclera nonicteric.   NECK: supple with FROM and no thyromegaly or mass, -carotid bruit or thrill .   HEART: RRR without murmurs, clicks or rubs.   LUNGS: CTA in all fields without rales, whezes or rubs.   ABDOMEN: soft with positive BS, nontender, no hepatosplenomegaly, no guarding or rebound and no masses or hernias.   EXTREMITIES: FROM without clubbing, cyanosis or edema.  Area discomfort right iliotibial band.  No severe pain/crepitus or effusion to hip or knee.  BACK: FROM, no point  tenderness, no bony stepoff or crepitus.   SKIN: warm and dry without rash or suspicious lesions.  Large ecchymosis left posterior medial thigh purpleish consistent with 1 week  NEUROLOGIC: CN II - XII normal, motor strength 5/5 throughout and sensory exam intact.  Mild to moderate facial tics/periorbital noted  PSYCH: cognitive function intact, judgement and insight good, mood/affect full range.    Assessment/Plan:  1. Facial tic  Comments:  Discussed options including neurology referral.  Will reduce caffeine, stress and monitor  2. Iliotibial band syndrome of right side  Comments:  Discussed options including PT/Ortho referral.  Will continue to work with his personal trainer  3. Gastroesophageal reflux disease without esophagitis  Comments:  I called in some Omeprazole 05/2023 and no longer taking medicine  4. 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 Hypercholesteremia  Comments:  On statin, omega-3 fatty acid and co-Q10  Overview:  Rx Atorvastatin 10 mg      Follow-up and Dispositions    Return in about 8 weeks (around 09/24/2023) for SAWV with labs.       Time documentation as follows: 10 minutes reviewing old records, labs and previous visit note, 15 minutes discussing history of present illness/complaint as well as discussing treatment and plan, 5 minutes performing pertinent exam, 5 minutes documenting chart for total time of 35 minutes  G2211 Visit complexity inherent to evaluation and management associated with medical care services that serve as the continuing focal point for  all needed health care services and/or with medical care services that are part of ongoing care related to a patients single, serious condition or a complex condition delineated above

## 2023-08-12 ENCOUNTER — Ambulatory Visit: Admit: 2023-08-12 | Discharge: 2023-08-12 | Payer: MEDICARE | Attending: Neurology | Primary: Emergency Medicine

## 2023-08-12 DIAGNOSIS — G514 Facial myokymia: Secondary | ICD-10-CM

## 2023-08-12 NOTE — Progress Notes (Signed)
 Patient is seen in office today as New Patient.    Patient states in today's visit that he has had Ticks since he was a child. Stated it does not bother him but would like to get it checked out.    Pt stated his eyes will blink and his cheeks twitch.

## 2023-08-12 NOTE — Progress Notes (Signed)
 Neurology Consult Note            Date:08/12/2023        Patient Name:Dalton Bailey     Date of Birth:[birth year]     Age:83 y.o.    Consults    Chief Complaint     Chief Complaint   Patient presents with    New Patient     Facial Ticks           History Obtained From   patient    History of Present Illness   Patient seen in neurologic consultation at the request of their PCP Kennyth Arnold, MD for facial twitching.  Patient is a 83 year old male with a past medical history significant for hypercholesterolemia, BPH, reflux and chronic insomnia.  Patient states for last months he has had twitching of his face as well as blinking.  He said he denies any medication changes.  He says it is both eyes will blink.  He says he does have some control over it.  He denies any increase in stress or other precipitating factors.  He denies any increase in caffeine or other changes.  He denies any tremor.  He denies any family history of tremor.  He said when he was younger he had tics he was 10 or 11 but they resolved.  He is not sure exactly what kind of tics he was having at the time.  He said he denied years ago he was also noted to have some tics by her primary care provider.  He says he has not had any problems with eye exams or has not been told he has blepharospasm by his eye doctors in the past when examining him.  He says is not significantly interfere with his activities of daily living.  He said he is not had any prolonged eye closure.  He has not had any facial pain.  He denies any numbness.  Denies any headaches denies any bladder or bowel problems denies any recent weight loss or gain.  He denies any other relieving or aggravating factors.    Past Medical History     Past Medical History:   Diagnosis Date    Aortic atherosclerosis 2022    Kidney CT incidental Ca aorta without aneurism    BPH (benign prostatic hyperplasia)     no Rxs    Chronic insomnia     early awakening    DDD (degenerative disc disease), cervical  05/23/2020    XR multilevel DDD/facet hypertrophy C4-7, anterolisthesis C3-4 and C4-5    ED (erectile dysfunction)     Rx Cialis 10mg     Facial tic     Family history of heart disease     M dec @ 22 (MI before 83 yo)    GERD with esophagitis 2021    without hemorrhage    History of colon polyps 06/2015    Dr. Nelva Bush current GI    History of nuclear stress test     normal ~2000    Hypercholesteremia     Rx Atorvastatin 10 mg    Medicare annual wellness visit, subsequent 09/24/2022    02/20/2023 LDL 101, testosterone free 3.8    Primary hypertension 2021    Seasonal allergic rhinitis     Pollen related    Senile purpura         Past Surgical History     Past Surgical History:   Procedure Laterality Date    BRAIN SURGERY  CATARACT EXTRACTION W/ INTRAOCULAR LENS IMPLANT  2015    COLONOSCOPY  06/18/2022    2 polyps with internal hemorrhoids Dr. Nelva Bush recall 5 years if indicated    ESOPHAGOGASTRODUODENOSCOPY  2022    Esophagitis NC with normal FU    FINGER TRIGGER RELEASE Bilateral 1997    5 different fingers    INGUINAL HERNIA REPAIR Right 2019    NC        Medications     Prior to Admission medications    Medication Sig Start Date End Date Taking? Authorizing Provider   omeprazole (PRILOSEC) 40 MG delayed release capsule Take 1 capsule by mouth every morning (before breakfast) 06/03/23  Yes Kennyth Arnold, MD   KRILL OIL PO Take by mouth   Yes [provider]   benzonatate (TESSALON) 100 MG capsule Take 1 capsule by mouth 3 times daily as needed for Cough   Yes [provider]   Cholecalciferol (VITAMIN D3) 50 MCG (2000 UT) CAPS Take by mouth   Yes [provider]   Turmeric (QC TUMERIC COMPLEX PO) Take by mouth   Yes [provider]   atorvastatin (LIPITOR) 20 MG tablet Take 1 tablet by mouth daily 09/24/22 10/29/23 Yes Kennyth Arnold, MD   Multiple Vitamins-Minerals (MULTIVITAMIN ADULTS PO) Take 1 tablet by mouth   Yes [provider]   Zinc 25 MG TABS Take 35 mg by mouth   Yes  [provider]   albuterol sulfate (PROAIR RESPICLICK) 108 (90 Base) MCG/ACT aerosol powder inhalation Inhale into the lungs every 6 hours as needed   Yes [provider]   ASPIRIN 81 PO Take 81 mg by mouth   Yes [provider]   chlorhexidine (PERIDEX) 0.12 % solution Swish and spit 15 mLs 2 times daily   Yes [provider]   Co-Enzyme Q10 100 MG CAPS Take by mouth   Yes [provider]   albuterol sulfate HFA (VENTOLIN HFA) 108 (90 Base) MCG/ACT inhaler Inhale 2 puffs into the lungs 4 times daily as needed for Wheezing or Shortness of Breath (Cough) 04/11/23 04/26/23  Rice, Celine Mans, DO   famotidine (PEPCID) 40 MG tablet Take 1 tablet by mouth nightly  Patient not taking: Reported on 08/12/2023 09/24/22 09/19/23  Kennyth Arnold, MD   tadalafil (CIALIS) 20 MG tablet Take 1 tablet by mouth as needed for Erectile Dysfunction 09/24/22 10/24/22  Kennyth Arnold, MD   ALPRAZolam Prudy Feeler) 0.25 MG tablet Take 1 tablet by mouth daily as needed. 1 tablet  Patient not taking: Reported on 08/12/2023    [provider]   guaiFENesin (MUCINEX) 600 MG extended release tablet Take 2 tablets by mouth 2 times daily  Patient not taking: Reported on 08/12/2023    [provider]   guaiFENesin (ROBITUSSIN) 100 MG/5ML liquid Take 10 mLs by mouth 3 times daily as needed  Patient not taking: Reported on 08/12/2023    [provider]        No current facility-administered medications for this visit.      Allergies   Codeine and Hydrocodone    Social History     Social History       Tobacco History       Smoking Status  Former Smoking Start Date  05/21/1958 Quit Date  05/21/1961 Average Packs/Day  0.5 packs/day for 3.0 years (1.5 ttl pk-yrs) Smoking Tobacco Type  Cigarettes from 05/21/1958 to 05/21/1961   Pack Year History     Packs/Day  From To Years    0 05/21/1961  62.2    0.5 05/21/1958 05/21/1961 3.0      Passive Exposure  Past      Smokeless Tobacco Use  Never              Alcohol History        Alcohol Use Status  Yes Comment  1 glass per week              Drug Use       Drug Use Status  Never              Sexual Activity       Sexually Active  Yes Partners  Male Comment  FriendVelna Hatchet                    Family History     Family History   Problem Relation Age of Onset    Coronary Art Dis Mother     Lung Cancer Father     Stroke Sister     Asthma Sister     Diabetes Son     Epilepsy Son     No Known Problems Son        Review of Systems   All systems reviewed and negative unless listed in HPI.   Review of Systems   Constitutional:  Negative for chills, fatigue and fever. Unexpected weight change: Weight loss, weight gain.  HENT:  Negative for hearing loss, tinnitus and trouble swallowing.         Decreased smell,  difficulty swallowing   Eyes:  Negative for visual disturbance (Blurred vision, double vision).   Respiratory:  Negative for cough, shortness of breath and wheezing.    Cardiovascular:  Negative for chest pain.   Gastrointestinal:  Negative for constipation, diarrhea, nausea and vomiting.        Reflux, heartburn   Endocrine: Positive for cold intolerance. Negative for heat intolerance.   Genitourinary:  Negative for difficulty urinating (Incontinence), frequency and urgency.   Musculoskeletal:  Negative for back pain and joint swelling.   Skin:  Negative for rash.   Allergic/Immunologic: Negative for environmental allergies (Hayfever, hives) and immunocompromised state.   Neurological: Negative.    Hematological:  Bruises/bleeds easily.   Psychiatric/Behavioral:  Positive for agitation (Irritability). Negative for dysphoric mood (depression).         Anxiety, depression, irritability       Physical Exam   BP 108/71 (BP Site: Right Upper Arm)   Pulse 80   Ht 1.676 m (5\' 6" )   Wt 80.7 kg (178 lb)   BMI 28.73 kg/m   Patient is alert no acute distress.  Vital signs are stable.  Well tended appearance.  Respirations are normal.  Cardiovascular shows no cyanosis, clubbing or edema.  Head  is atraumatic and normocephalic.  Neck was unremarkable.    Neurologic Exam:  Patient is right handed.  Alert and oriented x4.  There is no aphasia or dysarthria.  Normal speech fluency.  Normal fund of knowledge.  Normal insight.  Patient is able to follow 2-3 step commands without difficulty.  Patient is able to name.  Patient is able to repeat.  Normal remote and recent recall.  Able to repeat 3/3 objects and recall 3/3 at 5 minutes.  Normal attention and concentration.  Able to spell world backwards. Speech was normal with normal fluency. Memory was normal with short and long-term memory intact.  Cranial nerves II  through XII.  Patient intact bilaterally.  Visual fields are intact to confrontation.  Optic disks are flat bilaterally.  Pupil response to light is normal.  Extraocular muscles are intact.  Gaze is normal.  Eye movements are conjugate.  No ptosis is present.  Trigeminal nerves show facial sensation is normal bilaterally.  Masseter strength intact bilaterally.  Facial nerve show no facial weakness is present.  No facial asymmetry.  Vestibulocochlear nerve shows hearing is intact bilaterally.  No nystagmus.  Glossopharyngeal nerve palatal arch is symmetric.  Spinal accessory nerve shows normal shoulder shrug and sternocleidomastoid mastoid strength is normal hypoglossal nerve tongue is intact midline.  No fasciculations or weakness noted.  No trauma.  Motor exam of upper and lower extremity shows 5/5 strength and normal tone.  Sensation shows light touch, proprioception, vibration and pinprick are intact.  Cerebellar function showed finger-nose intact.  There is no ataxia or dysmetria.  Gait was normal.  Station shows no unsteadiness.  Able to toe walk and heel walk.  Romberg was negative.  DTRs are symmetric upper and lower extremity.  Both toes are downgoing.  No clonus.  Patient with some eye closure intermittently left or right eye there may be occasional twitching of the malar area on the left but  not sustained.  It was significantly improved with distractibility.      Labs      Office Visit on 04/11/2023   Component Date Value    SARS-COV-2 RNA, POC 04/11/2023 Negative     INFLUENZA A RNA, POC 04/11/2023 Detected     INFLUENZA B RNA, POC 04/11/2023 Not-Detected    Nurse Only on 02/20/2023   Component Date Value    Cholesterol, Total 02/20/2023 160     HDL 02/20/2023 42     Triglycerides 02/20/2023 85     LDL Cholesterol 02/20/2023 101.0 (H)     LDL/HDL Ratio 02/20/2023 2.4     Chol/HDL Ratio 02/20/2023 3.8     VLDL 02/20/2023 17.0     Total Protein 02/20/2023 7.2     Albumin 02/20/2023 4.1     Total Bilirubin 02/20/2023 0.59     Alk Phosphatase 02/20/2023 66     Bilirubin, Direct 02/20/2023 <0.20     AST 02/20/2023 27     ALT 02/20/2023 24     Bilirubin, Indirect 02/20/2023 NOTE     Testosterone, Free 02/20/2023 3.8 (L)           Imaging/Diagnostics   [facility] independently reviewed     No results found.        Assessment /Plan   1. Facial twitching  Overview:  Patient with complaint of 1 month history of facial twitching.  Bilaterally eye closure some left facial twitching as well.  Some control improves with distractibility.  Patient states he had tics when he was younger but they resolved.  Most likely represent tic although would be unusual to restart years later.  Question blepharospasm or hemifacial spasm.  Will recommend continue observation.  Have his fiance videotape him during these episodes.  Keep a symptom diary.  TSH, T4, B12 and folic acid message back 2 weeks follow-up in 4 months.  Orders:  -     TSH; Future  -     T4, Free; Future  -     Vitamin B12; Future  -     Folate; Future  2. Other specified forms of tremor  Comments:  Question tic versus hemifacial spasm or blepharospasm.  Orders:  -  TSH; Future  -     T4, Free; Future           Electronically signed by Philmore Pali, MD on 08/12/23 at 1:49 PM EDT

## 2023-08-13 NOTE — Telephone Encounter (Signed)
 Spoke with Pt and informed that we do not go into the Surgical section of his chart . Suggested that he may need to speak with Medical Records

## 2023-08-13 NOTE — Telephone Encounter (Signed)
 Patient is concern about NP office notes he hasn't had any brain surgery. Please review thanks

## 2023-09-04 ENCOUNTER — Encounter: Attending: Emergency Medicine | Primary: Emergency Medicine

## 2023-09-17 ENCOUNTER — Encounter: Primary: Emergency Medicine

## 2023-09-24 ENCOUNTER — Encounter: Attending: Emergency Medicine | Primary: Emergency Medicine

## 2023-10-03 ENCOUNTER — Encounter

## 2023-10-07 ENCOUNTER — Ambulatory Visit: Admit: 2023-10-07 | Discharge: 2023-10-07 | Payer: Medicare (Managed Care) | Primary: Emergency Medicine

## 2023-10-07 ENCOUNTER — Encounter

## 2023-10-07 DIAGNOSIS — I10 Essential (primary) hypertension: Secondary | ICD-10-CM

## 2023-10-07 LAB — CBC WITH AUTO DIFFERENTIAL
Basophils %: 0.4 % (ref 0.0–2.0)
Basophils Absolute: 0 10*3/uL (ref 0.0–0.2)
Eosinophils %: 2.7 % (ref 0.0–7.0)
Eosinophils Absolute: 0.1 10*3/uL (ref 0.0–0.5)
Hematocrit: 43.9 % (ref 38.0–52.0)
Hemoglobin: 14.4 g/dL (ref 13.0–17.3)
Immature Grans (Abs): 0.01 10*3/uL (ref 0.00–0.06)
Immature Granulocytes %: 0.2 % (ref 0.0–0.6)
Lymphocytes Absolute: 0.8 10*3/uL — ABNORMAL LOW (ref 1.0–3.2)
Lymphocytes: 16.2 % (ref 15.0–45.0)
MCH: 30.9 pg (ref 27.0–34.5)
MCHC: 32.8 g/dL (ref 30.0–36.0)
MCV: 94.2 fL (ref 84.0–100.0)
MPV: 10.5 fL (ref 7.0–12.2)
Monocytes %: 9.1 % (ref 4.0–12.0)
Monocytes Absolute: 0.5 10*3/uL (ref 0.3–1.0)
NRBC Absolute: 0 10*3/uL (ref 0.000–0.012)
NRBC Automated: 0 % (ref 0.0–0.2)
Neutrophils %: 71.4 % (ref 42.0–74.0)
Neutrophils Absolute: 3.7 10*3/uL (ref 1.6–7.3)
Platelets: 195 10*3/uL (ref 140–440)
RBC: 4.66 x10e6/mcL (ref 4.00–5.60)
RDW: 13.3 % (ref 10.0–17.0)
WBC: 5.2 10*3/uL (ref 3.8–10.6)

## 2023-10-07 MED ORDER — ATORVASTATIN CALCIUM 20 MG PO TABS
20 | ORAL_TABLET | Freq: Every day | ORAL | 0 refills | 90.00000 days | Status: DC
Start: 2023-10-07 — End: 2023-10-16

## 2023-10-07 NOTE — Progress Notes (Signed)
 Verbal consent obtained. Venipuncture performed using #22 gauge vacutainer at  right antecubital site with 1 successful attempt. No bruising, redness or edema noted. Pressure dressing applied to site.

## 2023-10-08 LAB — COMPREHENSIVE METABOLIC PANEL
ALT: 23 U/L (ref 0–42)
AST: 25 U/L (ref 0–46)
Albumin/Globulin Ratio: 1.8 (ref 1.00–2.70)
Albumin: 4.2 g/dL (ref 3.5–5.2)
Alk Phosphatase: 55 U/L (ref 40–130)
Anion Gap: 10 mmol/L (ref 2–17)
BUN: 22 mg/dL (ref 8–23)
CO2: 25 mmol/L (ref 22–29)
Calcium: 9.2 mg/dL (ref 8.5–10.7)
Chloride: 106 mmol/L (ref 98–107)
Creatinine: 1 mg/dL (ref 0.7–1.3)
Est, Glom Filt Rate: 76 mL/min/1.73mÂ² (ref >=60)
Globulin: 2.4 g/dL (ref 1.9–4.4)
Glucose: 98 mg/dL (ref 70–99)
Osmolaliy Calculated: 284 mosm/kg (ref 270–287)
Potassium: 5.1 mmol/L (ref 3.5–5.3)
Sodium: 141 mmol/L (ref 135–145)
Total Bilirubin: 0.49 mg/dL (ref 0.00–1.20)
Total Protein: 6.6 g/dL (ref 5.7–8.3)

## 2023-10-08 LAB — URINALYSIS W/ RFLX MICROSCOPIC
Bilirubin, Urine: NEGATIVE
Blood, Urine: NEGATIVE
Glucose, Ur: NEGATIVE
Ketones, Urine: NEGATIVE
Leukocyte Esterase, Urine: NEGATIVE
Nitrite, Urine: NEGATIVE
Protein, UA: NEGATIVE
Specific Gravity, UA: 1.015 (ref 1.003–1.035)
Urobilinogen, Urine: 0.2 EU/dL (ref 0.2–1.0)
pH, Urine: 7 (ref 4.5–8.0)

## 2023-10-08 LAB — PSA SCREENING: PSA, Screening: 1 ng/mL (ref 0.000–4.000)

## 2023-10-08 LAB — TSH REFLEX TO FT4: TSH: 1.83 u[IU]/mL (ref 0.358–3.740)

## 2023-10-08 LAB — LIPID PANEL
Chol/HDL Ratio: 3.8 (ref 0.0–4.4)
Cholesterol, Total: 146 mg/dL (ref 100–200)
HDL: 38 mg/dL — ABNORMAL LOW (ref >=40)
LDL Cholesterol: 89.2 mg/dL (ref 0.0–100.0)
LDL/HDL Ratio: 2.3
Triglycerides: 94 mg/dL (ref 0–149)
VLDL: 18.8 mg/dL (ref 5.0–40.0)

## 2023-10-08 LAB — T4, FREE: T4 Free: 1.07 ng/dL (ref 0.82–1.70)

## 2023-10-08 LAB — TSH: TSH, 3rd Generation: 1.83 u[IU]/mL (ref 0.358–3.740)

## 2023-10-08 LAB — VITAMIN B12: Vitamin B-12: 456 pg/mL (ref 232–1245)

## 2023-10-08 LAB — FOLATE: Folate: >20 ng/mL (ref 4.80–24.20)

## 2023-10-08 NOTE — Other (Signed)
 Left Voice Message for pt to contact office

## 2023-10-09 NOTE — Telephone Encounter (Signed)
 Pt calling back to speak about his results.

## 2023-10-09 NOTE — Other (Signed)
 Spoke with pt and informed of Lab results.

## 2023-10-10 NOTE — Telephone Encounter (Signed)
 Patient is requesting an refill on medication listed below and sent to pharmacy on file.    -ALPRAZolam (XANAX) 0.25 MG tablet

## 2023-10-10 NOTE — Telephone Encounter (Signed)
 Returned call to patient, states that he can wait until schedule appt. 10/16/2023 to discuss refills. Discussed that controlled medications typically to be filled at office appointments.

## 2023-10-16 ENCOUNTER — Ambulatory Visit
Admit: 2023-10-16 | Discharge: 2023-10-16 | Payer: Medicare (Managed Care) | Attending: Emergency Medicine | Primary: Emergency Medicine

## 2023-10-16 DIAGNOSIS — Z0000 Encounter for general adult medical examination without abnormal findings: Secondary | ICD-10-CM

## 2023-10-16 MED ORDER — OMEPRAZOLE 40 MG PO CPDR
40 | ORAL_CAPSULE | Freq: Every day | ORAL | 3 refills | 90.00000 days | Status: AC
Start: 2023-10-16 — End: ?

## 2023-10-16 MED ORDER — ALPRAZOLAM 0.25 MG PO TABS
0.25 | ORAL_TABLET | Freq: Every day | ORAL | 2 refills | 30.00000 days | Status: AC | PRN
Start: 2023-10-16 — End: 2024-01-14

## 2023-10-16 MED ORDER — FAMOTIDINE 40 MG PO TABS
40 | ORAL_TABLET | Freq: Every evening | ORAL | 3 refills | 30.00000 days | Status: DC
Start: 2023-10-16 — End: 2024-04-08

## 2023-10-16 MED ORDER — TADALAFIL 20 MG PO TABS
20 | ORAL_TABLET | ORAL | 5 refills | 30.00000 days | Status: AC | PRN
Start: 2023-10-16 — End: 2024-04-08

## 2023-10-16 MED ORDER — ATORVASTATIN CALCIUM 20 MG PO TABS
20 | ORAL_TABLET | Freq: Every day | ORAL | 3 refills | 90.00000 days | Status: AC
Start: 2023-10-16 — End: ?

## 2023-10-16 NOTE — Progress Notes (Signed)
 Medicare Annual Wellness Visit    Dalton Bailey is here for Medicare AWV (F/U Medical Conditions)    Assessment & Plan   Medicare annual wellness visit, subsequent  Comments:  History, Functional Ability, Depression and Health Risk assesed, as well as Medications reviewed including any opioid usage reviewed and discussed with patient  Screening for prostate cancer  Comments:  Symptoms and PSA reassuring  Orders:  -     PSA Screening; Future  History of colon polyps  Comments:  No further routine screenings  Family history of heart disease  Comments:  Maximally managing all CV risk factors  Hypercholesteremia  Comments:  At goal also takes omega-3 fatty acids and co-Q10  Orders:  -     atorvastatin  (LIPITOR) 20 MG tablet; Take 1 tablet by mouth daily, Disp-100 tablet, R-3Normal  -     CBC with Auto Differential; Future  -     Comprehensive Metabolic Panel; Future  -     Lipid Panel; Future  -     TSH reflex to FT4; Future  -     Urinalysis W/ Rflx Microscopic; Future  -     Lipid Panel; Future  -     Comprehensive Metabolic Panel; Future  -     CBC with Auto Differential; Future  Gastroesophageal reflux disease with esophagitis without hemorrhage  Comments:  On Pepcid   Orders:  -     famotidine  (PEPCID ) 40 MG tablet; Take 1 tablet by mouth nightly, Disp-90 tablet, R-3Only fill if patient callsNormal  Anxiety  Comments:  Rx Xanax 0.25 mg #30, 2 RF only takes rarely  Orders:  -     ALPRAZolam (XANAX) 0.25 MG tablet; Take 1 tablet by mouth daily as needed for Anxiety for up to 90 days. Max Daily Amount: 0.25 mg, Disp-30 tablet, R-2Normal  Erectile dysfunction due to arterial insufficiency  Comments:  Refilled Cialis   Orders:  -     tadalafil  (CIALIS ) 20 MG tablet; Take 1 tablet by mouth as needed for Erectile Dysfunction, Disp-30 tablet, R-5Normal  Testosterone deficiency in male  Comments:  Patient reportedly met with Urology.  Reassured no treatment needed  Orders:  -     Testosterone, Free; Future  Facial  twitching  Comments:  Saw Dr. Irvin Mantel       Return in about 6 months (around 04/17/2024) for Follow-up with labs, SAWV with labs in 1 year.     Subjective   CHIEF COMPLAINT:  Chief Complaint   Patient presents with    Medicare AWV     F/U Medical Conditions     HPI:   Symptom(s):   Dalton Bailey  is a pleasant  83 y.o. year-old patient with a Hx of  Hypercholesterolemia, HTN and FHx early cardiac disease who presents for a scheduled SAWV exam with labs.   New complaints: None, had a question regarding his rectus diathesis and need for surgery.  Recommended conservative measures.  No pain it just bothers him when he is exercising/weight training  Interval history/Specialists from last visit: Patient denies any hospitalizations or ER visits since last office visit.  Dr. Irvin Mantel evaluation of facial tics no treatment, updated folate and B12 levels levels.  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  Rx's Omeprazole  in January, 2025 for GERD symptoms improved and takes Pepcid  and Omeprazole  as needed only  Most recent Labs: revealed everything stable LDL 89 (see PMH/comments section) 10/07/2023.  Overdue tests/specialists: None  Lifestyle note: Patient going to the gym/personal trainer, sexually active girlfriend of 3 years (has known her for 10 years)  Additional chronic medical conditions managed include: BPH mild/no meds/ED, DDD Cervical spine, Seasonal rhinitis, Hx GERD 2021 resolved, Rectus diathesis and Insomnia/early awakening.   Normal follow-up every 6 months with labs.  Specialist(s):  Dr. Virgil Griffiths, GI colonoscopy with 2 polypectomies 05/2022 recall 5 years if indicated, Low testosterone 2024  Dr. Irvin Mantel, neurology initial OV 08/08/2023 facial tic  Urology  Test(s):  CT abdomen and pelvis 10/2020 incidental finding calcified aorta without aneurysm     Patient's complete Health Risk Assessment and screening values have  been reviewed and are found in Flowsheets. The following problems were reviewed today and where indicated follow up appointments were made and/or referrals ordered.    Positive Risk Factor Screenings with Interventions:    Fall Risk:  Do you feel unsteady or are you worried about falling? : (!) (Proxy-Rptd) yes  2 or more falls in past year?: (Proxy-Rptd) no  Fall with injury in past year?: (Proxy-Rptd) no     Interventions:    Reviewed medications, home hazards, visual acuity, and co-morbidities that can increase risk for falls                         Past Medical History:   Diagnosis Date    Aortic atherosclerosis 2022    Kidney CT incidental Ca aorta without aneurism    BPH (benign prostatic hyperplasia)     no Rxs    Chronic insomnia     early awakening    DDD (degenerative disc disease), cervical 05/23/2020    XR multilevel DDD/facet hypertrophy C4-7, anterolisthesis C3-4 and C4-5    ED (erectile dysfunction)     Rx Cialis  10mg     Facial tic     Family history of heart disease     M dec @ 87 (MI before 83 yo)    GERD with esophagitis 2021    without hemorrhage    History of colon polyps 06/2015    Dr. Virgil Griffiths current GI    History of nuclear stress test     normal ~2000    Hypercholesteremia     LDL 89    Medicare annual wellness visit, subsequent 10/16/2023    10/07/2023 Labs creatinine 1.0, glucose 98, LFTs normal, LDL 89, CBC, TSH and UA normal, PSA 1.0    Seasonal allergic rhinitis     Pollen related    Senile purpura      Past Surgical History:   Procedure Laterality Date    CATARACT EXTRACTION W/ INTRAOCULAR LENS IMPLANT  2015    COLONOSCOPY  06/18/2022    2 polyps with internal hemorrhoids Dr. Virgil Griffiths recall 5 years if indicated    ESOPHAGOGASTRODUODENOSCOPY  2022    Esophagitis NC with normal FU    FINGER TRIGGER RELEASE Bilateral 1997    5 different fingers    INGUINAL HERNIA REPAIR Right 2019    NC       ROS:   General/Constitutional:   Patient denies fever, shakes or chills, excessive weight gain or  weight loss. Comments insomnia/awakens after 5 hours has oatmeal and goes back to sleep for 2 hours   Endocrine:   Patient denies excessive thirst, hunger or frequent urination.   Allergy/immunology:  Patient denies asthma, rheumatologic condition. Patient has seasonal allergies twice a year   Ophthalmologic:   Patient denies diminished visual  acuity, eye pain or discharge.   Respiratory:   Patient denies cough, shortness of breath, wheezing or hemoptysis.   Cardiovascular:   Patient denies chest pain, palpatations or syncope.   Gastrointestinal:   Patient denies abdominal pain, nausea, vomiting or diarrhea, blood in stool. Comments Hx colon polyps up-to-date  Men Only:   Patient BPH symptoms other than nocturia X1 and decreased stream, takes Cialis  20 mg, 1/2 tablet as needed.  Comments low testosterone 02/2023  Musculoskeletal:   Patient denies leg swelling, painful or swollen joints.  Complains of 1 month right lateral thigh tightness/discomfort  skin:   Patient denies rash.   Neurologic:   Patient denies dizziness, loss of use of extremity or focal neurologic deficit.  Complains OV 07/31/2023 facial tics/periorbital (history of similar problems as a child)  Psychiatric:   Patient denies anxiety, depression or suicidal thoughts.        Objective   Vitals:    10/16/23 1139   BP: 107/72   BP Site: Left Upper Arm   Patient Position: Sitting   BP Cuff Size: Large Adult   Pulse: 82   Resp: 14   Temp: 98.1 F (36.7 C)   TempSrc: Temporal   SpO2: 96%   Weight: 80.7 kg (178 lb)   Height: 1.676 m (5' 5.98")      Body mass index is 28.74 kg/m.      Physical Examination:   GENERAL APPEARANCE: well developed and well nourished in NAD.   EYES: PERRLA, EOMI conjunctiva clear and sclera nonicteric.   EARS: canals clear and TM's are normal.   NOSE: Bilateral nares are patent with normal inferior turbinates without drainage, erythema or masses.   THROAT: MMM, pharynx normal without erythema or exudate.   NECK: supple with FROM  and no thyromegaly or mass, -carotid bruit or thrill .   HEART: RRR without murmurs, clicks or rubs.   LUNGS: CTA in all fields without rales, whezes or rubs.   ABDOMEN: Rectus diastasis, soft with positive BS, nontender, no hepatosplenomegaly, no guarding or rebound and no masses or hernias.   MALE GENITOURINARY: not examined.   RECTAL EXAM: not examined.   EXTREMITIES: FROM without clubbing, cyanosis or edema. Area discomfort right iliotibial band. No severe pain/crepitus or effusion to hip or knee.   BACK: FROM, no point tenderness, no bony stepoff or crepitus.   SKIN: warm and dry without rash or suspicious lesions.   VASCULAR: 2+ bilateral radial pulses, 2+ dorsalis pedis and posterior tibial pulses bilaterally date: 10/16/2023.   NEUROLOGIC: CN II - XII normal, motor strength 5/5 throughout and sensory exam intact. Mild to moderate facial tics/periorbital noted   PSYCH: cognitive function intact, judgement and insight good, mood/affect full range.            Allergies   Allergen Reactions    Codeine Hallucinations     Hyperactivity, hallucinations    Hydrocodone Other (See Comments)     hyperactivity     Prior to Visit Medications    Medication Sig Taking? Authorizing Provider   atorvastatin  (LIPITOR) 20 MG tablet Take 1 tablet by mouth daily Yes Dorothyann Gather, MD   famotidine  (PEPCID ) 40 MG tablet Take 1 tablet by mouth nightly Yes Dorothyann Gather, MD   omeprazole  (PRILOSEC) 40 MG delayed release capsule Take 1 capsule by mouth every morning (before breakfast) Yes Dorothyann Gather, MD   tadalafil  (CIALIS ) 20 MG tablet Take 1 tablet by mouth as needed for Erectile Dysfunction Yes Dorothyann Gather, MD  ALPRAZolam (XANAX) 0.25 MG tablet Take 1 tablet by mouth daily as needed for Anxiety for up to 90 days. Max Daily Amount: 0.25 mg Yes Dorothyann Gather, MD   magnesium (MAGNESIUM-OXIDE) 250 MG TABS tablet Take 1 tablet by mouth daily Yes [provider]   B Complex-Folic Acid (B COMPLEX-VITAMIN B12 PO) Take by mouth Yes  [provider]   KRILL OIL PO Take by mouth Yes [provider]   Cholecalciferol (VITAMIN D3) 50 MCG (2000 UT) CAPS Take by mouth Yes [provider]   Turmeric (QC TUMERIC COMPLEX PO) Take by mouth Yes [provider]   Multiple Vitamins-Minerals (MULTIVITAMIN ADULTS PO) Take 1 tablet by mouth Yes [provider]   Zinc 25 MG TABS Take 35 mg by mouth Yes [provider]   albuterol  sulfate (PROAIR  RESPICLICK) 108 (90 Base) MCG/ACT aerosol powder inhalation Inhale into the lungs every 6 hours as needed Yes [provider]   ALPRAZolam (XANAX) 0.25 MG tablet Take 1 tablet by mouth daily as needed. 1 tablet Yes [provider]   ASPIRIN 81 PO Take 81 mg by mouth Yes [provider]   chlorhexidine (PERIDEX) 0.12 % solution Swish and spit 15 mLs 2 times daily Yes [provider]   Co-Enzyme Q10 100 MG CAPS Take by mouth Yes [provider]   albuterol  sulfate HFA (VENTOLIN  HFA) 108 (90 Base) MCG/ACT inhaler Inhale 2 puffs into the lungs 4 times daily as needed for Wheezing or Shortness of Breath (Cough)  Rice, Chad Colon, DO       CareTeam (Including outside providers/suppliers regularly involved in providing care):   Patient Care Team:  Dorothyann Gather, MD as PCP - General (General Practice)  Dorothyann Gather, MD as PCP - Empaneled Provider     Recommendations for Preventive Services Due: see orders and patient instructions/AVS.  Recommended screening schedule for the next 5-10 years is provided to the patient in written form: see Patient Instructions/AVS.     Reviewed and updated this visit:  Tobacco  Allergies  Meds  Problems  Med Hx  Surg Hx  Fam Hx  Sexual   Hx          Medical decision making included assessment and treatment of problems: 2 or more stable chronic conditions (family history of heart disease, hypercholesterolemia, GERD, anxiety, ED and facial twitches), analysis of information including: Recent labs  reviewed, future labs ordered, records from other providers reviewed, and the overall risk was moderate   G2211 Visit complexity inherent to evaluation and management associated with medical care services that serve as the continuing focal point for all needed health care services and/or with medical care services that are part of ongoing care related to a patients single, serious condition or a complex condition delineated above

## 2023-10-16 NOTE — Patient Instructions (Signed)
 Preventing Falls: Care Instructions  Injuries and health problems such as trouble walking or poor eyesight can increase your risk of falling. So can some medicines. But there are things you can do to help prevent falls. You can exercise to get stronger. You can also arrange your home to make it safer.    Talk to your doctor about the medicines you take. Ask if any of them increase the risk of falls and whether they can be changed or stopped.   Try to exercise regularly. It can help improve your strength and balance. This can help lower your risk of falling.         Practice fall safety and prevention.   Wear low-heeled shoes that fit well and give your feet good support. Talk to your doctor if you have foot problems that make this hard.  Carry a cellphone or wear a medical alert device that you can use to call for help.  Use stepladders instead of chairs to reach high objects. Don't climb if you're at risk for falls. Ask for help, if needed.  Wear the correct eyeglasses, if you need them.        Make your home safer.   Remove rugs, cords, clutter, and furniture from walkways.  Keep your house well lit. Use night-lights in hallways and bathrooms.  Install and use sturdy handrails on stairways.  Wear nonskid footwear, even inside. Don't walk barefoot or in socks without shoes.        Be safe outside.   Use handrails, curb cuts, and ramps whenever possible.  Keep your hands free by using a shoulder bag or backpack.  Try to walk in well-lit areas. Watch out for uneven ground, changes in pavement, and debris.  Be careful in the winter. Walk on the grass or gravel when sidewalks are slippery. Use de-icer on steps and walkways. Add non-slip devices to shoes.    Put grab bars and nonskid mats in your shower or tub and near the toilet. Try to use a shower chair or bath bench when bathing.   Get into a tub or shower by putting in your weaker leg first. Get out with your strong side first. Have a phone or medical alert  device in the bathroom with you.   Where can you learn more?  Go to [employer] and enter G117 to learn more about "Preventing Falls: Care Instructions."  Current as of: December 19, 2022  Content Version: 14.4   2024-2025 [place], [place].   Care instructions adapted under license by [facility]. If you have questions about a medical condition or this instruction, always ask your healthcare professional. Laren Player, [facility], disclaims any warranty or liability for your use of this information.         A Healthy Heart: Care Instructions  Overview     Coronary artery disease, also called heart disease, occurs when a substance called plaque builds up in the vessels that supply oxygen-rich blood to your heart muscle. This can narrow the blood vessels and reduce blood flow. A heart attack happens when blood flow is completely blocked. A high-fat diet, smoking, and other factors increase the risk of heart disease.  Your doctor has found that you have a chance of having heart disease. A heart-healthy lifestyle can help keep your heart healthy and prevent heart disease. This lifestyle includes eating healthy, being active, staying at a weight that's healthy for you, and not smoking or using tobacco. It also includes taking medicines  as directed, managing other health conditions, and trying to get a healthy amount of sleep.  Follow-up care is a key part of your treatment and safety. Be sure to make and go to all appointments, and call your doctor if you are having problems. It's also a good idea to know your test results and keep a list of the medicines you take.  How can you care for yourself at home?  Diet    Use less salt when you cook and eat. This helps lower your blood pressure. Taste food before salting. Add only a little salt when you think you need it. With time, your taste buds will adjust to less salt.     Eat fewer snack items, fast foods, canned soups, and other high-salt,  high-fat, processed foods.     Read food labels and try to avoid saturated and trans fats. They increase your risk of heart disease by raising cholesterol levels.     Limit the amount of solid fat--butter, margarine, and shortening--you eat. Use olive, peanut, or canola oil when you cook. Bake, broil, and steam foods instead of frying them.     Eat a variety of fruit and vegetables every day. Dark green, deep orange, red, or yellow fruits and vegetables are especially good for you. Examples include spinach, carrots, peaches, and berries.     Foods high in fiber can reduce your cholesterol and provide important vitamins and minerals. High-fiber foods include whole-grain cereals and breads, oatmeal, beans, brown rice, citrus fruits, and apples.     Eat lean proteins. Heart-healthy proteins include seafood, lean meats and poultry, eggs, beans, peas, nuts, seeds, and soy products.     Limit drinks and foods with added sugar. These include candy, desserts, and soda pop.   Heart-healthy lifestyle    If your doctor recommends it, get more exercise. For many people, walking is a good choice. Or you may want to swim, bike, or do other activities. Bit by bit, increase the time you're active every day. Try for at least 30 minutes on most days of the week.     Try to quit or cut back on using tobacco and other nicotine products. This includes smoking and vaping. If you need help quitting, talk to your doctor about stop-smoking programs and medicines. These can increase your chances of quitting for good. Quitting is one of the most important things you can do to protect your heart. It is never too late to quit. Try to avoid secondhand smoke too.     Stay at a weight that's healthy for you. Talk to your doctor if you need help losing weight.     Try to get 7 to 9 hours of sleep each night.     Limit alcohol to 2 drinks a day for men and 1 drink a day for women. Too much alcohol can cause health problems.     Manage other health  problems such as diabetes, high blood pressure, and high cholesterol. If you think you may have a problem with alcohol or drug use, talk to your doctor.   Medicines    Take your medicines exactly as prescribed. Call your doctor if you think you are having a problem with your medicine.     If your doctor recommends aspirin, take the amount directed each day. Make sure you take aspirin and not another kind of pain reliever, such as acetaminophen (Tylenol).   When should you call for help?   Call 911  if you have symptoms of a heart attack. These may include:    Chest pain or pressure, or a strange feeling in the chest.     Sweating.     Shortness of breath.     Pain, pressure, or a strange feeling in the back, neck, jaw, or upper belly or in one or both shoulders or arms.     Lightheadedness or sudden weakness.     A fast or irregular heartbeat.   After you call 911, the operator may tell you to chew 1 adult-strength or 2 to 4 low-dose aspirin. Wait for an ambulance. Do not try to drive yourself.  Watch closely for changes in your health, and be sure to contact your doctor if you have any problems.  Where can you learn more?  Go to [employer] and enter F075 to learn more about "A Healthy Heart: Care Instructions."  Current as of: December 19, 2022  Content Version: 14.4   2024-2025 [place], [place].   Care instructions adapted under license by [facility]. If you have questions about a medical condition or this instruction, always ask your healthcare professional. Laren Player, [facility], disclaims any warranty or liability for your use of this information.    Personalized Preventive Plan for Dalton Bailey - 10/16/2023  Medicare offers a range of preventive health benefits. Some of the tests and screenings are paid in full while other may be subject to a deductible, co-insurance, and/or copay.  Some of these benefits include a comprehensive review of your medical history including  lifestyle, illnesses that may run in your family, and various assessments and screenings as appropriate.  After reviewing your medical record and screening and assessments performed today your provider may have ordered immunizations, labs, imaging, and/or referrals for you.  A list of these orders (if applicable) as well as your Preventive Care list are included within your After Visit Summary for your review.

## 2023-10-28 ENCOUNTER — Encounter

## 2023-10-28 MED ORDER — ALBUTEROL SULFATE HFA 108 (90 BASE) MCG/ACT IN AERS
108 | Freq: Four times a day (QID) | RESPIRATORY_TRACT | 1 refills | 25.00000 days | Status: AC | PRN
Start: 2023-10-28 — End: 2024-04-08

## 2023-10-28 NOTE — Telephone Encounter (Signed)
 Received call from patient. He is requesting a refill of albuterol  to [facility] on [address]. Last ov with Dr Elby Green 10/16/23. He is not due to return till May 2026.

## 2023-11-05 NOTE — Telephone Encounter (Signed)
 Called and spoke with patient. Advised patient he can get the RSV vaccine at his local pharmacy. He stated he will go to [facility].

## 2023-11-05 NOTE — Telephone Encounter (Signed)
 Patient called in wondering if it would be ok for him to get RSV vaccine and where. Informed pt that he can get it from the pharmacy but I will check if he needs it.

## 2023-12-16 ENCOUNTER — Encounter: Payer: Medicare (Managed Care) | Attending: Neurology | Primary: Emergency Medicine

## 2024-01-27 ENCOUNTER — Telehealth

## 2024-01-27 MED ORDER — NIRMATRELVIR&RITONAVIR 300/100 20 X 150 MG & 10 X 100MG PO TBPK
20 | ORAL_TABLET | ORAL | 0 refills | Status: AC
Start: 2024-01-27 — End: 2024-02-01

## 2024-01-27 NOTE — Telephone Encounter (Signed)
 Patient with minor symptoms, normal creatinine.  Candidate for Paxlovid  (hold statin)  Oneil Sabin, MD

## 2024-01-27 NOTE — Telephone Encounter (Signed)
 Patient called in stating that he was exposed to covid on Friday and took a test two hours ago and his results his postive. Patient has no SOB just stuffy nose, headache and a cough, pt would like to know if he can get Paxlovid  called in

## 2024-01-27 NOTE — Telephone Encounter (Signed)
 Returned call to patient, discussed indications to go to ER, isolation precautions, to hold statin during Paxlovid  treatment. [facility]acy is [facility] [place]

## 2024-02-06 ENCOUNTER — Ambulatory Visit
Admit: 2024-02-06 | Discharge: 2024-02-06 | Payer: Medicare (Managed Care) | Attending: Emergency Medicine | Primary: Emergency Medicine

## 2024-02-06 VITALS — BP 138/80 | HR 77 | Temp 98.40000°F | Resp 14 | Wt 179.8 lb

## 2024-02-06 DIAGNOSIS — U071 COVID-19: Principal | ICD-10-CM

## 2024-02-06 NOTE — Progress Notes (Signed)
 CHIEF COMPLAINT:  Chief Complaint   Patient presents with    Post-COVID Symptoms     (Positive for COVID on 01/27/2024)     Reports feeling of chest flutters intermittently       HPI:   Symptom(s):   Dalton Bailey is a pleasant  83 y.o. year-old patient with Hx of: Hypercholesterolemia, HTN and FHx early cardiac disease  who presents for a scheduled follow-up.  He called the office with 3 days of mild URI symptoms positive COVID testing at home.  Rx Paxlovid  01/27/2024 and symptoms improved.  He was instructed to hold his statin during treatment.  New complaints: Patient states improved in regards to his COVID but would like to discuss some heart fluttering.  Patient reports that while at rest has brief, less than 1 to 2-second feeling of palpitation over the last couple of months.  No other associated symptoms including no chest pain, shortness of breath or syncope.  No neurologic changes.  He does take multiple OTC meds including vitamin D, EE, fish oil, magnesium, zinc, once a day multivitamin, co-Q10, chromium, turmeric, Ginkgo Biloba and [place].  (Recommended holding the last 2 OTC meds/even stopping all of these and starting back 1 at a time spaced a week apart)  Interval history/Specialists from last visit: Patient denies any hospitalizations or ER visits since last office visit.   Previous visit complaints: Discussed facial tics, right iliotibial band/trainer improved, GERD stable on daily Pepcid  with as needed Omeprazole   Most recent Labs:  revealed everything stable LDL 89 (see PMH/comments section) 10/07/2023.    Overdue tests/specialists: None  Lifestyle note: Patient going to the gym/personal trainer, sexually active girlfriend of 3 years (has known her for 10 years)  Additional chronic medical conditions managed include: BPH mild/no meds/ED, DDD Cervical spine, Seasonal rhinitis, Hx GERD 2021 resolved, Rectus diathesis-observation and Insomnia/early awakening.   Normal follow-up every 6 months with  labs.  Specialist(s):  Dr. Terra, GI colonoscopy with 2 polypectomies 05/2022 recall 5 years if indicated, Low testosterone 2024  Dr. 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     Current Outpatient Medications   Medication Sig Note Dispense Refill    albuterol  sulfate HFA (VENTOLIN  HFA) 108 (90 Base) MCG/ACT inhaler Inhale 2 puffs into the lungs 4 times daily as needed for Wheezing or Shortness of Breath (Cough)  1 each 1    atorvastatin  (LIPITOR) 20 MG tablet Take 1 tablet by mouth daily  100 tablet 3    famotidine  (PEPCID ) 40 MG tablet Take 1 tablet by mouth nightly  90 tablet 3    omeprazole  (PRILOSEC) 40 MG delayed release capsule Take 1 capsule by mouth every morning (before breakfast) 02/06/2024: Taking as needed 90 capsule 3    tadalafil  (CIALIS ) 20 MG tablet Take 1 tablet by mouth as needed for Erectile Dysfunction  30 tablet 5    magnesium (MAGNESIUM-OXIDE) 250 MG TABS tablet Take 1 tablet by mouth daily       B Complex-Folic Acid (B COMPLEX-VITAMIN B12 PO) Take by mouth       KRILL OIL PO Take by mouth       Cholecalciferol (VITAMIN D3) 50 MCG (2000 UT) CAPS Take by mouth       Turmeric (QC TUMERIC COMPLEX PO) Take by mouth       Multiple Vitamins-Minerals (MULTIVITAMIN ADULTS PO) Take 1 tablet by mouth  Zinc 25 MG TABS Take 35 mg by mouth       albuterol  sulfate (PROAIR  RESPICLICK) 108 (90 Base) MCG/ACT aerosol powder inhalation Inhale into the lungs every 6 hours as needed 09/24/2022: As needed      ALPRAZolam  (XANAX ) 0.25 MG tablet Take 1 tablet by mouth daily as needed. 1 tablet 09/24/2022: As needed      ASPIRIN 81 PO Take 81 mg by mouth       chlorhexidine (PERIDEX) 0.12 % solution Swish and spit 15 mLs 2 times daily 09/24/2022: As needed      Co-Enzyme Q10 100 MG CAPS Take by mouth        No current facility-administered medications for this visit.     Allergies   Allergen Reactions     Codeine Hallucinations     Hyperactivity, hallucinations    Hydrocodone Other (See Comments)     hyperactivity     Family History: reviewed/updated as indicated  Social History: reviewed/updated as indicated  Past Medical History:   Diagnosis Date    Aortic atherosclerosis 2022    Kidney CT incidental Ca aorta without aneurism    BPH (benign prostatic hyperplasia)     no Rxs    Chronic insomnia     early awakening    DDD (degenerative disc disease), cervical 05/23/2020    XR multilevel DDD/facet hypertrophy C4-7, anterolisthesis C3-4 and C4-5    ED (erectile dysfunction)     Rx Cialis  10mg     Facial tic     Family history of heart disease     M dec @ 45 (MI before 83 yo)    GERD with esophagitis 2021    without hemorrhage    History of colon polyps 06/2015    Dr. Terra current GI    History of nuclear stress test     normal ~2000    Hypercholesteremia     LDL 89    Medicare annual wellness visit, subsequent 10/16/2023    10/07/2023 Labs creatinine 1.0, glucose 98, LFTs normal, LDL 89, CBC, TSH and UA normal, PSA 1.0    Seasonal allergic rhinitis     Pollen related    Senile purpura      Past Surgical History:   Procedure Laterality Date    CATARACT EXTRACTION W/ INTRAOCULAR LENS IMPLANT  2015    COLONOSCOPY  06/18/2022    2 polyps with internal hemorrhoids Dr. Terra recall 5 years if indicated    ESOPHAGOGASTRODUODENOSCOPY  2022    Esophagitis NC with normal FU    FINGER TRIGGER RELEASE Bilateral 1997    5 different fingers    INGUINAL HERNIA REPAIR Right 2019    NC       ROS:   General/Constitutional:   Patient denies fever, shakes or chills, excessive weight gain or weight loss. Comments insomnia/awakens after 5 hours has oatmeal and goes back to sleep for 2 hours   Endocrine:   Patient denies excessive thirst, hunger or frequent urination.   Allergy/immunology:  Patient denies asthma, rheumatologic condition. Patient has seasonal allergies twice a year   Ophthalmologic:   Patient denies diminished visual  acuity, eye pain or discharge.   Respiratory:   Patient denies cough, shortness of breath, wheezing or hemoptysis.   Cardiovascular:   Patient denies chest pain, palpatations or syncope.  Comments less than 1 second heart fluttering since 11/2023 occurs at rest  Gastrointestinal:   Patient denies abdominal pain, nausea, vomiting or diarrhea, blood in stool. Comments Hx  colon polyps up-to-date  Men Only:   Patient BPH symptoms other than nocturia X1 and decreased stream, takes Cialis  20 mg, 1/2 tablet as needed.  Comments low testosterone 02/2023  Musculoskeletal:   Patient denies leg swelling, painful or swollen joints.  Complains of 1 month right lateral thigh tightness/discomfort  skin:   Patient denies rash.   Neurologic:   Patient denies dizziness, loss of use of extremity or focal neurologic deficit.  Complains OV 07/31/2023 facial tics/periorbital (history of similar problems as a child)  Psychiatric:   Patient denies anxiety, depression or suicidal thoughts.    Vital signs: BP 138/80 (BP Site: Left Upper Arm, Patient Position: Sitting, BP Cuff Size: Small Adult)   Pulse 77   Temp 98.4 F (36.9 C) (Temporal)   Resp 14   Wt 81.6 kg (179 lb 12.8 oz)   SpO2 97%   BMI 29.03 kg/m       Physical Examination:   GENERAL APPEARANCE: well developed and well nourished in NAD.   EYES: PERRLA, EOMI conjunctiva clear and sclera nonicteric.   EARS: canals clear and TM's are normal.   NOSE: Bilateral nares are patent with normal inferior turbinates without drainage, erythema or masses.   THROAT: MMM, pharynx normal without erythema or exudate.   NECK: supple with FROM and no thyromegaly or mass, -carotid bruit or thrill .   HEART: RRR without murmurs, clicks or rubs.   LUNGS: CTA in all fields without rales, whezes or rubs.   ABDOMEN: Rectus diastasis, soft with positive BS, nontender, no hepatosplenomegaly, no guarding or rebound and no masses or hernias.   MALE GENITOURINARY: not examined.   RECTAL EXAM: not  examined.   EXTREMITIES: FROM without clubbing, cyanosis or edema. Area discomfort right iliotibial band. No severe pain/crepitus or effusion to hip or knee.   BACK: FROM, no point tenderness, no bony stepoff or crepitus.   SKIN: warm and dry without rash or suspicious lesions.   VASCULAR: 2+ bilateral radial pulses, 2+ dorsalis pedis and posterior tibial pulses bilaterally date: 10/16/2023.   NEUROLOGIC: CN II - XII normal, motor strength 5/5 throughout and sensory exam intact. Mild to moderate facial tics/periorbital noted   PSYCH: cognitive function intact, judgement and insight good, mood/affect full range.     EKG:  NSR with LAFB no acute ST or T wave changes 02/06/2024    Assessment/Plan:  1. COVID  Comments:  Telephone visit called in Paxlovid  01/27/2024.  Symptoms improved  2. Palpitations  Comments:  EKG, Holter monitor, hold multivitamins.  Discussed signs and symptoms of A-fib/self testing has Kardia at home  Orders:  -     EKG 12 lead; Future  -     Extended cardiac holter monitor (3 days - 15 days); Future  3. Hypercholesteremia  Comments:  Controlled  Overview:  Rx Atorvastatin  10 mg  4. Gastroesophageal reflux disease with esophagitis without hemorrhage  Comments:  Controlled  Overview:  without hemorrhage  5. Anxiety  Comments:  Stable      Follow-up and Dispositions    Return in about 2 months (around 04/08/2024) for Follow-up with labs.         Time documentation as follows: 10 minutes reviewing old records, labs and previous visit note, 15 minutes discussing history of present illness/complaint as well as discussing treatment and plan, 5 minutes performing pertinent exam, 5 minutes documenting chart for total time of 35 minutes  [id number] Visit complexity inherent to evaluation and management associated with medical care services  that serve as the continuing focal point for all needed health care services and/or with medical care services that are part of ongoing care related to a patients single,  serious condition or a complex condition delineated above

## 2024-02-17 ENCOUNTER — Encounter

## 2024-02-17 NOTE — Telephone Encounter (Signed)
 Pt called stating he has not heard from Cardiology yet. Please advise where referral was sent, etc.

## 2024-02-17 NOTE — Telephone Encounter (Signed)
 Spoke with Cecillia at [facility] re; order sent on 02/06/2024 for Holter monitor placement. Will advise patient to contact [facility] office to schedule monitor placement. Patient notified and given phone number for cardiology office.

## 2024-03-03 ENCOUNTER — Inpatient Hospital Stay
Admit: 2024-02-19 | Discharge: 2024-03-23 | Payer: Medicare (Managed Care) | Attending: Emergency Medicine | Primary: Emergency Medicine

## 2024-03-03 DIAGNOSIS — R002 Palpitations: Principal | ICD-10-CM

## 2024-03-03 NOTE — ED Provider Notes (Signed)
 "  ED Provider Note    History     Chief Complaint   Patient presents with    Extremity Laceration     Right hand. On broken glass        This is an 83 year old male who presents to the ED with complaint of extremity laceration. Patient accidently cut his right hand with broken glass around 1345. Patient unsure of last tetanus shot. His fingers are working fine. He denies any other injury.    The history is provided by the patient and medical records.       No past medical history on file.    No past surgical history on file.    No family history on file.    Social History[1]    Review of Systems   Constitutional:  Negative for fatigue and fever.   HENT:  Negative for congestion, ear pain and sore throat.    Eyes:  Negative for pain.   Respiratory:  Negative for shortness of breath and wheezing.    Cardiovascular:  Negative for chest pain.   Gastrointestinal:  Negative for abdominal pain, diarrhea and vomiting.   Genitourinary:  Negative for dysuria and hematuria.   Skin:  Positive for wound. Negative for rash.   Neurological:  Negative for seizures and headaches.   Psychiatric/Behavioral:  Negative for confusion.        Physical Exam   BP (!) 157/92   Pulse 90   Temp 36.6 C (97.8 F)   Resp 18   SpO2 95%     Physical Exam  Vitals and nursing note reviewed.   Constitutional:       General: He is not in acute distress.     Appearance: Normal appearance. He is not toxic-appearing.   HENT:      Head: Normocephalic and atraumatic.      Nose: Nose normal.      Mouth/Throat:      Mouth: Mucous membranes are moist.   Eyes:      Extraocular Movements: Extraocular movements intact.      Pupils: Pupils are equal, round, and reactive to light.   Cardiovascular:      Rate and Rhythm: Normal rate and regular rhythm.   Pulmonary:      Effort: Pulmonary effort is normal. No respiratory distress.      Breath sounds: Normal breath sounds.   Abdominal:      General: Abdomen is flat.      Palpations: Abdomen is soft.       Tenderness: There is no abdominal tenderness.   Musculoskeletal:         General: No tenderness. Normal range of motion.      Cervical back: Normal range of motion and neck supple.   Skin:     General: Skin is warm and dry.      Comments: 4 cm laceration on mid dorsal asp of good flexion and extension of all fingers and good sensation distally.    Neurological:      General: No focal deficit present.      Mental Status: He is alert and oriented to person, place, and time. Mental status is at baseline.   Psychiatric:         Mood and Affect: Mood normal.         Thought Content: Thought content normal.         ED Documentation - Procedures and MDM   Lac Repair    Date/Time:  03/03/2024 4:06 PM    Performed by: Fairy JINNY Cornet, MD  Authorized by: Fairy JINNY Cornet, MD    Verbal consent obtained?: Yes    Consent given by:  Patient  Patient states understanding of procedure being performed: Yes    Patient's understanding of procedure matches consent: Yes    Procedure consent matches procedure scheduled: Yes    Relevant documents present and verified: Yes    Test results available and properly labeled: Yes    Body area:  Upper extremity  Location details:  Right hand  Laceration length (cm):  4  Foreign bodies:  Unknown  Tendon involvement:  Superficial  Nerve involvement:  None  Anesthesia:  Local infiltration  Local anesthetic:  Lidocaine 1% without epinephrine  Debridement:  None  Undermining:  None  Skin closure:  Ethilon  Number of sutures:  3  Suture technique:  Simple  Approximation:  Close  Approximation difficulty:  Simple  Dressing:  Splint  Patient tolerance:  Patient tolerated the procedure well with no immediate complications   Base of wound well visualized, removed all foreign bodies, there were 2 small ones. Partial laceration of the extensor tendon overlying 3rd metacarpal is partially lacerated. Good approximation. I will splint him in finger extension.      Medical Decision Making  Differentials:   Hand  laceration  In need of tetanus update  Doubt tendon laceration  Doubt bony fracture  Doubt significant neurovascular injury    Treatment plan: Lac repair and splint him in finger extension. Patient has partial laceration of the extensor digitorum tendon.           Clinical & Imaging Tests: Ordered & Reviewed      Labs  No data to display    Imaging - FINAL READ ONLY  ED FINAL READ - IMAGES       None            Imaging - WET & FINAL READ  Imaging Results    None         ED Medications: Ordered, Reviewed & Administered     ED Medications  Medications   lidocaine (Xylocaine) 10mg /mL (1%) injection 30 mg (30 mg Intradermal Given 03/03/24 1621)   diphth,pertus(acell),tetanus (Boostrix) IM injectable syringe 0.5 mL (0.5 mL Intramuscular Given 03/03/24 1702)   bacitracin 500 unit/gram ointment ( Topical Given 03/03/24 1702)       Home Medications  Current Outpatient Medications   Medication Sig    cephALEXin (Keflex) 500 mg capsule Take 1 capsule by mouth every 6 hours for 5 days.    oxyCODONE IR (Roxicodone) 5 mg immediate release tablet Take 1 tablet by mouth every 6 hours as needed for severe pain.       ED Consults     Consult Orders (From admission, onward)      None            Decision Support              .                   Clinical Opiate Withdrawal (If Applicable):        ED Course Notes     Clinical Impressions as of 03/04/24 0715   Hand laceration involving tendon, left, initial encounter       Plan          Attestations     [facility] ED ATTESTATION      Scribe Attestation:  Chiquita Batter acting as scribe for and in the presence of Dr. Trafalgar.                   [1]  Social History  Substance Use Topics    Alcohol use: Never    Fairy JINNY Bethany, MD  03/04/24 0715    "

## 2024-03-16 NOTE — Progress Notes (Signed)
"  Patient: Dalton Bailey  DOB: Apr 26, 1941  MRN: [id number]    Subjective:     Patient ID: Dalton Bailey is a 83 y.o. male who presents for follow-up of right hand laceration with concern for partial extensor tendon injury.  Date of injury was 03/04/2024.  He comes in today denying any pain.  Denies any changing in regards to his ability to actively extend the middle finger.    Review of Systems: With the exception of the above, patient did not report any acute issues.      Objective:     Vitals:    03/16/24 0829   BP: (!) 169/109   Pulse: 75   Weight: 78.9 kg (174 lb)   Height: 170.2 cm (5' 7)       Examination: Evaluation of the right hand shows traumatic wound to be healing well for the most part.  Sutures were removed, with mild central dehiscence noted, as a result of simple closure.  No signs of infection.  Able to actively extend fully.    Imaging: No new imaging.    Assessment:  1. Extensor tendon laceration of right hand with open wound, initial encounter             Treatment Plan:    It was discussed with the patient I recommend Steri-Strips to further aid in reducing the tension at his wound site.  As his wound was closed in simple fashion, I discussed he should expect a little bit wider scar formation.  I am okay with him slowly returning to activities as tolerated.  Steri-Strips were placed, and the others provided.  Will see him back as needed.    Procedures    This note may contain grammatical errors as it was completed with the use of voice recognition software. Please excuse any unintended errors or inconsistencies.     Josefa Kerns, MD  Hand and Upper Extremity Orthopedics  [facility]    "

## 2024-03-31 ENCOUNTER — Encounter: Payer: Medicare (Managed Care) | Primary: Emergency Medicine

## 2024-03-31 ENCOUNTER — Inpatient Hospital Stay: Admit: 2024-03-31 | Payer: Medicare (Managed Care) | Primary: Emergency Medicine

## 2024-03-31 DIAGNOSIS — E7800 Pure hypercholesterolemia, unspecified: Principal | ICD-10-CM

## 2024-03-31 LAB — CBC WITH AUTO DIFFERENTIAL
Basophils %: 0.2 % (ref 0.0–2.0)
Basophils Absolute: 0 x10e3/mcL (ref 0.0–0.2)
Eosinophils %: 1.8 % (ref 0.0–7.0)
Eosinophils Absolute: 0.1 x10e3/mcL (ref 0.0–0.5)
Hematocrit: 45.5 % (ref 38.0–52.0)
Hemoglobin: 15.3 g/dL (ref 13.0–17.3)
Immature Grans (Abs): 0.02 x10e3/mcL (ref 0.00–0.06)
Immature Granulocytes %: 0.3 % (ref 0.0–0.6)
Lymphocytes Absolute: 0.9 x10e3/mcL — ABNORMAL LOW (ref 1.0–3.2)
Lymphocytes: 15.2 % (ref 15.0–45.0)
MCH: 30.8 pg (ref 27.0–34.5)
MCHC: 33.6 g/dL (ref 30.0–36.0)
MCV: 91.5 fL (ref 84.0–100.0)
MPV: 10 fL (ref 7.0–12.2)
Monocytes %: 8.6 % (ref 4.0–12.0)
Monocytes Absolute: 0.5 x10e3/mcL (ref 0.3–1.0)
Neutrophils %: 73.9 % (ref 42.0–74.0)
Neutrophils Absolute: 4.6 x10e3/mcL (ref 1.6–7.3)
Platelets: 218 x10e3/mcL (ref 140–440)
RBC: 4.97 x10e6/mcL (ref 4.00–5.60)
RDW: 12 % (ref 10.0–17.0)
WBC: 6.2 x10e3/mcL (ref 3.8–10.6)

## 2024-03-31 LAB — COMPREHENSIVE METABOLIC PANEL
ALT: 29 U/L (ref 0–42)
AST: 28 U/L (ref 0–46)
Albumin/Globulin Ratio: 1 (ref 1.00–2.70)
Albumin: 4.3 g/dL (ref 3.5–5.2)
Alk Phosphatase: 57 U/L (ref 40–130)
Anion Gap: 11 mmol/L (ref 2–17)
BUN: 25 mg/dL — ABNORMAL HIGH (ref 8–23)
CO2: 28 mmol/L (ref 22–29)
Calcium: 9.3 mg/dL (ref 8.5–10.7)
Chloride: 101 mmol/L (ref 98–107)
Creatinine: 1 mg/dL (ref 0.7–1.3)
Est, Glom Filt Rate: 76 mL/min/1.73mÂ² (ref >=60)
Globulin: 3 g/dL (ref 1.9–4.4)
Glucose: 102 mg/dL — ABNORMAL HIGH (ref 70–99)
Osmolaliy Calculated: 284 mosm/kg (ref 270–287)
Potassium: 4.5 mmol/L (ref 3.5–5.3)
Sodium: 140 mmol/L (ref 135–145)
Total Bilirubin: 0.5 mg/dL (ref 0.00–1.20)
Total Protein: 7.2 g/dL (ref 5.7–8.3)

## 2024-03-31 LAB — LIPID PANEL
Chol/HDL Ratio: 4 (ref 0.0–4.4)
Cholesterol, Total: 171 mg/dL (ref 100–200)
HDL: 43 mg/dL (ref >=40)
LDL Cholesterol: 108 mg/dL — ABNORMAL HIGH (ref 0.0–100.0)
LDL/HDL Ratio: 3
Triglycerides: 98 mg/dL (ref 0–149)
VLDL: 19.6 mg/dL (ref 5.0–40.0)

## 2024-04-01 ENCOUNTER — Encounter: Primary: Emergency Medicine

## 2024-04-08 ENCOUNTER — Ambulatory Visit
Admit: 2024-04-08 | Discharge: 2024-04-08 | Payer: Medicare (Managed Care) | Attending: Emergency Medicine | Primary: Emergency Medicine

## 2024-04-08 VITALS — BP 128/74 | HR 96 | Temp 98.10000°F | Resp 14 | Wt 180.0 lb

## 2024-04-08 DIAGNOSIS — R002 Palpitations: Principal | ICD-10-CM

## 2024-04-08 NOTE — Progress Notes (Signed)
 "CHIEF COMPLAINT:  Chief Complaint   Patient presents with    Follow-up     6 month f/u       HPI:   Symptom(s):   Dalton Bailey is a pleasant  83 y.o. 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   New complaints: Patient healing from accidental glass laceration dorsum right hand/partial tendon LAC treated through [facility] Ezzard hand surgeon.  In September patient mentioned getting over COVID but heart fluttering lasting less than 2 seconds.  Symptoms going on couple of months.  No other cardiac complaints.  Recommended stopping multiple OTC meds including Ginkgo Biloba and [place], Holter obtained 2% PVCs, 5 episodes PSVT up to 5 beats, PVCs up to 4 beats.  Patient reports episodes unchanged/nothing severe.  Interval history/Specialists from last visit: Patient denies any hospitalizations or ER visits since last office visit.  Accidental hand laceration with partial tendon laceration-treated through hand surgery  Previous visit complaints: N/A  Most recent Labs: revealed everything stable creatinine 1.1/BUN 25, glucose 102, LDL 108 (89) (see PMH/comments section) 03/31/2024.    Overdue tests/specialists: N/A  Lifestyle note:  Patient going to the gym/personal trainer, sexually active girlfriend of 3 years (has known her for 10 years)  Additional chronic medical conditions managed include: BPH mild/no meds/ED, Low testosterone 2024, DDD Cervical spine, Seasonal rhinitis, Hx GERD 2021 resolved, Rectus diathesis-observation and Insomnia/early awakening.   Normal follow-up every 6 months with labs.  Specialist(s):  Dr. Terra, GI colonoscopy with 2 polypectomies 05/2022 recall 5 years if indicatedDr. 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   14-day Holter 2% PVCs, 5  episodes PSVT/5 beats up to 129, 53 PVCs up to 4 beats -03/03/2024     Current Outpatient Medications   Medication Sig Note Dispense Refill    albuterol  sulfate HFA (VENTOLIN  HFA) 108 (90 Base) MCG/ACT inhaler Inhale 2 puffs into the lungs 4 times daily as needed for Wheezing or Shortness of Breath (Cough)  1 each 1    atorvastatin  (LIPITOR) 20 MG tablet Take 1 tablet by mouth daily  100 tablet 3    omeprazole  (PRILOSEC) 40 MG delayed release capsule Take 1 capsule by mouth every morning (before breakfast) 02/06/2024: Taking as needed 90 capsule 3    tadalafil  (CIALIS ) 20 MG tablet Take 1 tablet by mouth as needed for Erectile Dysfunction  30 tablet 5    magnesium (MAGNESIUM-OXIDE) 250 MG TABS tablet Take 1 tablet by mouth daily       B Complex-Folic Acid (B COMPLEX-VITAMIN B12 PO) Take by mouth       KRILL OIL PO Take by mouth       Cholecalciferol (VITAMIN D3) 50 MCG (2000 UT) CAPS Take by mouth       Turmeric (QC TUMERIC COMPLEX PO) Take by mouth       Multiple Vitamins-Minerals (MULTIVITAMIN ADULTS PO) Take 1 tablet by mouth       Zinc 25 MG TABS Take 35 mg by mouth       albuterol  sulfate (PROAIR  RESPICLICK) 108 (90 Base) MCG/ACT aerosol powder inhalation Inhale into the lungs every 6 hours as needed 09/24/2022: As needed      ALPRAZolam  (XANAX ) 0.25 MG tablet Take 1 tablet by mouth daily as needed. 1 tablet 09/24/2022: As needed  ASPIRIN 81 PO Take 81 mg by mouth       chlorhexidine (PERIDEX) 0.12 % solution Swish and spit 15 mLs 2 times daily 09/24/2022: As needed      Co-Enzyme Q10 100 MG CAPS Take by mouth        No current facility-administered medications for this visit.     Allergies   Allergen Reactions    Codeine Hallucinations     Hyperactivity, hallucinations    Hydrocodone Other (See Comments)     hyperactivity     Family History: reviewed/updated as indicated  Social History: reviewed/updated as indicated  Past Medical History:   Diagnosis Date    Aortic atherosclerosis 2022    Kidney CT incidental Ca  aorta without aneurism    BPH (benign prostatic hyperplasia)     no Rxs    Chronic insomnia     early awakening    DDD (degenerative disc disease), cervical 05/23/2020    XR multilevel DDD/facet hypertrophy C4-7, anterolisthesis C3-4 and C4-5    ED (erectile dysfunction)     Rx Cialis  10mg     Facial tic     Family history of heart disease     M dec @ 51 (MI before 84 yo)    GERD with esophagitis 2021    without hemorrhage    History of colon polyps 06/2015    Dr. Terra current GI    History of nuclear stress test     normal ~2000    Hypercholesteremia     LDL 89    Medicare annual wellness visit, subsequent 10/16/2023    03/31/2024 Labs creatinine 1.1/BUN 25, glucose 102, LFTs normal, LDL 108 (89), CBC normal    Seasonal allergic rhinitis     Pollen related    Senile purpura      Past Surgical History:   Procedure Laterality Date    CATARACT EXTRACTION W/ INTRAOCULAR LENS IMPLANT  2015    COLONOSCOPY  06/18/2022    2 polyps with internal hemorrhoids Dr. Terra recall 5 years if indicated    ESOPHAGOGASTRODUODENOSCOPY  2022    Esophagitis NC with normal FU    FINGER TRIGGER RELEASE Bilateral 1997    5 different fingers    INGUINAL HERNIA REPAIR Right 2019    NC       ROS:   General/Constitutional:   Patient denies fever, shakes or chills, excessive weight gain or weight loss. Comments insomnia/awakens after 5 hours has oatmeal and goes back to sleep for 2 hours   Endocrine:   Patient denies excessive thirst, hunger or frequent urination.   Allergy/immunology:  Patient denies asthma, rheumatologic condition. Patient has seasonal allergies twice a year   Ophthalmologic:   Patient denies diminished visual acuity, eye pain or discharge.   Respiratory:   Patient denies cough, shortness of breath, wheezing or hemoptysis.   Cardiovascular:   Patient denies chest pain, palpatations or syncope.  Comments less than 1-2 second heart fluttering since 11/2023 occurs at rest  Gastrointestinal:   Patient denies abdominal pain,  nausea, vomiting or diarrhea, blood in stool. Comments Hx colon polyps up-to-date  Men Only:   Patient without BPH symptoms other than nocturia X1 and decreased stream, takes Cialis  20 mg, 1/2 tablet as needed.  Comments low testosterone 02/2023  Musculoskeletal:   Patient denies leg swelling, painful or swollen joints.    Skin:   Patient denies rash.   Neurologic:   Patient denies dizziness, loss of use of extremity or focal neurologic  deficit.  Complains OV 07/31/2023 facial tics/periorbital (Hx of similar problems as a child)  Psychiatric:   Patient denies anxiety, depression or suicidal thoughts.    Vital signs: BP 128/74 (BP Site: Right Upper Arm, Patient Position: Sitting, BP Cuff Size: Large Adult)   Pulse 96   Temp 98.1 F (36.7 C) (Temporal)   Resp 14   Wt 81.6 kg (180 lb)   SpO2 97%   BMI 29.07 kg/m       Physical Examination:   GENERAL APPEARANCE: well developed and well nourished in NAD.   EYES: PERRLA, EOMI conjunctiva clear and sclera nonicteric.   NECK: supple with FROM and no thyromegaly or mass, -carotid bruit or thrill .   HEART: RRR without murmurs, clicks or rubs.   LUNGS: CTA in all fields without rales, whezes or rubs.   ABDOMEN: Rectus diastasis, soft with positive BS, nontender, no hepatosplenomegaly, no guarding or rebound and no masses or hernias.   MALE GENITOURINARY: not examined.   RECTAL EXAM: not examined.   EXTREMITIES: FROM without clubbing, cyanosis or edema.   BACK: FROM, no point tenderness, no bony stepoff or crepitus.   SKIN: warm and dry without rash or suspicious lesions.   VASCULAR: 2+ bilateral radial pulses, 2+ dorsalis pedis and posterior tibial pulses bilaterally date: 04/08/2024  NEUROLOGIC: CN II - XII normal, motor strength 5/5 throughout and sensory exam intact. Mild to moderate facial tics/periorbital noted   PSYCH: cognitive function intact, judgement and insight good, mood/affect full range.     Assessment/Plan:  1. Palpitations  Comments:  Holter monitor  pretty unremarkable.  Offered beta-blocker/cardiology referral declined.  Will continue to monitor  2. Hypercholesteremia  Comments:  Controlled  Overview:  Rx Atorvastatin  10 mg  3. Gastroesophageal reflux disease with esophagitis without hemorrhage  Comments:  Patient states only taking Prilosec once a week and no longer takes Pepcid  for the past year  Overview:  without hemorrhage  4. Anxiety  Comments:  Stable  5. Mild intermittent asthma, unspecified whether complicated  Comments:  Rare use of inhaler      Follow-up and Dispositions    Return in about 6 months (around 10/06/2024) for SAWV with labs.         Medical decision making included assessment and treatment of problems: 2 or more stable chronic conditions (palpitations, hypercholesterolemia, GERD, anxiety, mild asthma), analysis of information including: Recent labs reviewed, future labs ordered, records from other providers reviewed, and the overall risk was moderate    "
